# Patient Record
Sex: Male | Born: 1955
Health system: Southern US, Community
[De-identification: ages and names within clinical notes are randomized; demographics above are authoritative.]

## PROBLEM LIST (undated history)

## (undated) DIAGNOSIS — IMO0001 Reserved for inherently not codable concepts without codable children: Secondary | ICD-10-CM

## (undated) DIAGNOSIS — J449 Chronic obstructive pulmonary disease, unspecified: Secondary | ICD-10-CM

## (undated) DIAGNOSIS — I519 Heart disease, unspecified: Secondary | ICD-10-CM

## (undated) DIAGNOSIS — F1910 Other psychoactive substance abuse, uncomplicated: Secondary | ICD-10-CM

## (undated) DIAGNOSIS — N179 Acute kidney failure, unspecified: Secondary | ICD-10-CM

## (undated) DIAGNOSIS — I509 Heart failure, unspecified: Secondary | ICD-10-CM

## (undated) DIAGNOSIS — I4891 Unspecified atrial fibrillation: Secondary | ICD-10-CM

## (undated) DIAGNOSIS — E785 Hyperlipidemia, unspecified: Secondary | ICD-10-CM

## (undated) DIAGNOSIS — F419 Anxiety disorder, unspecified: Secondary | ICD-10-CM

## (undated) HISTORY — DX: Heart disease, unspecified: I51.9

## (undated) HISTORY — DX: Hyperlipidemia, unspecified: E78.5

## (undated) HISTORY — DX: Unspecified atrial fibrillation: I48.91

---

## 2015-05-07 DIAGNOSIS — Z85828 Personal history of other malignant neoplasm of skin: Secondary | ICD-10-CM | POA: Insufficient documentation

## 2016-03-31 ENCOUNTER — Inpatient Hospital Stay (HOSPITAL_COMMUNITY)
Admit: 2016-03-31 | Discharge: 2016-03-31 | Disposition: A | Discharge status: Home / Self Care | Payer: 59 | Attending: Internal Medicine | Admitting: Internal Medicine

## 2016-03-31 ENCOUNTER — Emergency Department: Payer: 59

## 2016-03-31 ENCOUNTER — Inpatient Hospital Stay
Admission: EM | Admit: 2016-03-31 | Discharge: 2016-04-02 | DRG: 291 | Payer: 59 | Attending: Internal Medicine | Admitting: Internal Medicine

## 2016-03-31 ENCOUNTER — Inpatient Hospital Stay: Payer: 59

## 2016-03-31 ENCOUNTER — Encounter: Payer: Self-pay | Admitting: Emergency Medicine

## 2016-03-31 DIAGNOSIS — I5031 Acute diastolic (congestive) heart failure: Secondary | ICD-10-CM | POA: Diagnosis not present

## 2016-03-31 DIAGNOSIS — I5021 Acute systolic (congestive) heart failure: Secondary | ICD-10-CM | POA: Diagnosis not present

## 2016-03-31 DIAGNOSIS — N170 Acute kidney failure with tubular necrosis: Secondary | ICD-10-CM | POA: Diagnosis present

## 2016-03-31 DIAGNOSIS — Z452 Encounter for adjustment and management of vascular access device: Secondary | ICD-10-CM

## 2016-03-31 DIAGNOSIS — I509 Heart failure, unspecified: Secondary | ICD-10-CM | POA: Diagnosis not present

## 2016-03-31 DIAGNOSIS — I4892 Unspecified atrial flutter: Secondary | ICD-10-CM | POA: Diagnosis present

## 2016-03-31 DIAGNOSIS — I5023 Acute on chronic systolic (congestive) heart failure: Secondary | ICD-10-CM | POA: Diagnosis present

## 2016-03-31 DIAGNOSIS — I130 Hypertensive heart and chronic kidney disease with heart failure and stage 1 through stage 4 chronic kidney disease, or unspecified chronic kidney disease: Secondary | ICD-10-CM | POA: Diagnosis present

## 2016-03-31 DIAGNOSIS — R000 Tachycardia, unspecified: Secondary | ICD-10-CM | POA: Diagnosis present

## 2016-03-31 DIAGNOSIS — I4891 Unspecified atrial fibrillation: Secondary | ICD-10-CM | POA: Diagnosis present

## 2016-03-31 DIAGNOSIS — I248 Other forms of acute ischemic heart disease: Secondary | ICD-10-CM | POA: Diagnosis present

## 2016-03-31 DIAGNOSIS — I5041 Acute combined systolic (congestive) and diastolic (congestive) heart failure: Secondary | ICD-10-CM | POA: Diagnosis not present

## 2016-03-31 DIAGNOSIS — J449 Chronic obstructive pulmonary disease, unspecified: Secondary | ICD-10-CM | POA: Diagnosis present

## 2016-03-31 DIAGNOSIS — F1010 Alcohol abuse, uncomplicated: Secondary | ICD-10-CM | POA: Diagnosis present

## 2016-03-31 DIAGNOSIS — I959 Hypotension, unspecified: Secondary | ICD-10-CM | POA: Diagnosis present

## 2016-03-31 DIAGNOSIS — R7989 Other specified abnormal findings of blood chemistry: Secondary | ICD-10-CM | POA: Diagnosis present

## 2016-03-31 DIAGNOSIS — J810 Acute pulmonary edema: Secondary | ICD-10-CM

## 2016-03-31 DIAGNOSIS — Z7982 Long term (current) use of aspirin: Secondary | ICD-10-CM

## 2016-03-31 DIAGNOSIS — I420 Dilated cardiomyopathy: Secondary | ICD-10-CM | POA: Diagnosis present

## 2016-03-31 DIAGNOSIS — Z87891 Personal history of nicotine dependence: Secondary | ICD-10-CM | POA: Diagnosis not present

## 2016-03-31 DIAGNOSIS — R778 Other specified abnormalities of plasma proteins: Secondary | ICD-10-CM

## 2016-03-31 DIAGNOSIS — R0602 Shortness of breath: Secondary | ICD-10-CM | POA: Diagnosis present

## 2016-03-31 DIAGNOSIS — Z8249 Family history of ischemic heart disease and other diseases of the circulatory system: Secondary | ICD-10-CM

## 2016-03-31 DIAGNOSIS — N179 Acute kidney failure, unspecified: Secondary | ICD-10-CM | POA: Diagnosis not present

## 2016-03-31 DIAGNOSIS — K761 Chronic passive congestion of liver: Secondary | ICD-10-CM | POA: Diagnosis present

## 2016-03-31 DIAGNOSIS — K7200 Acute and subacute hepatic failure without coma: Secondary | ICD-10-CM | POA: Diagnosis present

## 2016-03-31 DIAGNOSIS — N189 Chronic kidney disease, unspecified: Secondary | ICD-10-CM | POA: Diagnosis present

## 2016-03-31 DIAGNOSIS — F1290 Cannabis use, unspecified, uncomplicated: Secondary | ICD-10-CM | POA: Diagnosis present

## 2016-03-31 DIAGNOSIS — Z79899 Other long term (current) drug therapy: Secondary | ICD-10-CM

## 2016-03-31 DIAGNOSIS — R570 Cardiogenic shock: Secondary | ICD-10-CM | POA: Diagnosis present

## 2016-03-31 DIAGNOSIS — R945 Abnormal results of liver function studies: Secondary | ICD-10-CM

## 2016-03-31 HISTORY — DX: Heart failure, unspecified: I50.9

## 2016-03-31 HISTORY — DX: Acute kidney failure, unspecified: N17.9

## 2016-03-31 HISTORY — DX: Chronic obstructive pulmonary disease, unspecified: J44.9

## 2016-03-31 HISTORY — DX: Other psychoactive substance abuse, uncomplicated: F19.10

## 2016-03-31 HISTORY — DX: Reserved for inherently not codable concepts without codable children: IMO0001

## 2016-03-31 HISTORY — DX: Anxiety disorder, unspecified: F41.9

## 2016-03-31 LAB — COMPREHENSIVE METABOLIC PANEL
ALT: 828 U/L — ABNORMAL HIGH (ref 17–63)
AST: 785 U/L — ABNORMAL HIGH (ref 15–41)
Albumin: 4.1 g/dL (ref 3.5–5.0)
Alkaline Phosphatase: 81 U/L (ref 38–126)
Anion gap: 10 (ref 5–15)
BUN: 19 mg/dL (ref 6–20)
CO2: 23 mmol/L (ref 22–32)
Calcium: 8.9 mg/dL (ref 8.9–10.3)
Chloride: 99 mmol/L — ABNORMAL LOW (ref 101–111)
Creatinine, Ser: 1.12 mg/dL (ref 0.61–1.24)
GFR calc Af Amer: >60 mL/min (ref >60)
GFR calc non Af Amer: >60 mL/min (ref >60)
Glucose, Bld: 146 mg/dL — ABNORMAL HIGH (ref 65–99)
Potassium: 3.5 mmol/L (ref 3.5–5.1)
Sodium: 132 mmol/L — ABNORMAL LOW (ref 135–145)
Total Bilirubin: 2.9 mg/dL — ABNORMAL HIGH (ref 0.3–1.2)
Total Protein: 7.4 g/dL (ref 6.5–8.1)

## 2016-03-31 LAB — MAGNESIUM
Magnesium: <0.1 mg/dL — CL (ref 1.7–2.4)
Magnesium: 2.9 mg/dL — ABNORMAL HIGH (ref 1.7–2.4)

## 2016-03-31 LAB — CBC
HCT: 40.4 % (ref 40.0–52.0)
Hemoglobin: 13.4 g/dL (ref 13.0–18.0)
MCH: 32.6 pg (ref 26.0–34.0)
MCHC: 33.2 g/dL (ref 32.0–36.0)
MCV: 98 fL (ref 80.0–100.0)
Platelets: 276 10*3/uL (ref 150–440)
RBC: 4.12 MIL/uL — ABNORMAL LOW (ref 4.40–5.90)
RDW: 14.3 % (ref 11.5–14.5)
WBC: 11.4 10*3/uL — ABNORMAL HIGH (ref 3.8–10.6)

## 2016-03-31 LAB — GLUCOSE, CAPILLARY: Glucose-Capillary: 152 mg/dL — ABNORMAL HIGH (ref 65–99)

## 2016-03-31 LAB — MRSA PCR SCREENING: MRSA by PCR: NEGATIVE

## 2016-03-31 LAB — BRAIN NATRIURETIC PEPTIDE: B Natriuretic Peptide: 1693 pg/mL — ABNORMAL HIGH (ref 0.0–100.0)

## 2016-03-31 LAB — IRON AND TIBC
Iron: 79 ug/dL (ref 45–182)
Saturation Ratios: 23 % (ref 17.9–39.5)
TIBC: 349 ug/dL (ref 250–450)
UIBC: 270 ug/dL

## 2016-03-31 LAB — FERRITIN: Ferritin: 3690 ng/mL — ABNORMAL HIGH (ref 24–336)

## 2016-03-31 LAB — ACETAMINOPHEN LEVEL: Acetaminophen (Tylenol), Serum: <10 ug/mL — ABNORMAL LOW (ref 10–30)

## 2016-03-31 LAB — ECHOCARDIOGRAM COMPLETE
Height: 71 in
Weight: 2694.9 oz

## 2016-03-31 LAB — PROTIME-INR
INR: 1.84
Prothrombin Time: 21.2 seconds — ABNORMAL HIGH (ref 11.4–15.0)

## 2016-03-31 LAB — TROPONIN I
Troponin I: 0.17 ng/mL — ABNORMAL HIGH (ref <0.031)
Troponin I: 0.2 ng/mL — ABNORMAL HIGH (ref <0.031)
Troponin I: 0.19 ng/mL — ABNORMAL HIGH (ref <0.031)

## 2016-03-31 LAB — TSH
TSH: 0.436 u[IU]/mL (ref 0.350–4.500)
TSH: 1.135 u[IU]/mL (ref 0.350–4.500)

## 2016-03-31 LAB — FIBRIN DERIVATIVES D-DIMER (ARMC ONLY): Fibrin derivatives D-dimer (ARMC): 3388 — ABNORMAL HIGH (ref 0–499)

## 2016-03-31 MED ORDER — IOPAMIDOL (ISOVUE-370) INJECTION 76%
100.0000 mL | Freq: Once | INTRAVENOUS | Status: AC | PRN
  Administered 2016-03-31: 100 mL via INTRAVENOUS

## 2016-03-31 MED ORDER — AMIODARONE LOAD VIA INFUSION
150.0000 mg | Freq: Once | INTRAVENOUS | Status: AC
Start: 2016-03-31 — End: 2016-03-31
  Administered 2016-03-31: 150 mg via INTRAVENOUS
  Filled 2016-03-31: qty 83.34

## 2016-03-31 MED ORDER — FUROSEMIDE 10 MG/ML IJ SOLN
40.0000 mg | Freq: Two times a day (BID) | INTRAMUSCULAR | Status: DC
  Administered 2016-03-31 – 2016-04-02 (×4): 40 mg via INTRAVENOUS
  Filled 2016-03-31 (×4): qty 4

## 2016-03-31 MED ORDER — AMIODARONE HCL IN DEXTROSE 360-4.14 MG/200ML-% IV SOLN: 60.0000 mg/h | INTRAVENOUS | Status: DC

## 2016-03-31 MED ORDER — ENOXAPARIN SODIUM 40 MG/0.4ML ~~LOC~~ SOLN: 40.0000 mg | SUBCUTANEOUS | Status: DC

## 2016-03-31 MED ORDER — AMIODARONE HCL IN DEXTROSE 360-4.14 MG/200ML-% IV SOLN
60.0000 mg/h | INTRAVENOUS | Status: AC
  Administered 2016-03-31 (×2): 60 mg/h via INTRAVENOUS
  Filled 2016-03-31 (×2): qty 200

## 2016-03-31 MED ORDER — DILTIAZEM HCL 25 MG/5ML IV SOLN
15.0000 mg | Freq: Once | INTRAVENOUS | Status: AC
  Administered 2016-03-31: 15 mg via INTRAVENOUS
  Filled 2016-03-31: qty 5

## 2016-03-31 MED ORDER — MAGNESIUM SULFATE 2 GM/50ML IV SOLN
2.0000 g | Freq: Once | INTRAVENOUS | Status: AC
  Administered 2016-03-31: 2 g via INTRAVENOUS
  Filled 2016-03-31: qty 50

## 2016-03-31 MED ORDER — TIOTROPIUM BROMIDE MONOHYDRATE 18 MCG IN CAPS
18.0000 ug | ORAL_CAPSULE | Freq: Every day | RESPIRATORY_TRACT | Status: DC
  Administered 2016-03-31 – 2016-04-02 (×3): 18 ug via RESPIRATORY_TRACT
  Filled 2016-03-31: qty 5

## 2016-03-31 MED ORDER — AMIODARONE LOAD VIA INFUSION
150.0000 mg | Freq: Once | INTRAVENOUS | Status: DC
  Filled 2016-03-31: qty 83.34

## 2016-03-31 MED ORDER — DILTIAZEM HCL 30 MG PO TABS
30.0000 mg | ORAL_TABLET | Freq: Four times a day (QID) | ORAL | Status: DC
  Administered 2016-03-31 (×2): 30 mg via ORAL
  Filled 2016-03-31 (×2): qty 1

## 2016-03-31 MED ORDER — IPRATROPIUM-ALBUTEROL 0.5-2.5 (3) MG/3ML IN SOLN: 3.0000 mL | Freq: Four times a day (QID) | RESPIRATORY_TRACT | Status: DC | PRN

## 2016-03-31 MED ORDER — DEXTROSE 5 % IV SOLN: 60.0000 mg/h | Freq: Once | INTRAVENOUS | Status: DC

## 2016-03-31 MED ORDER — ASPIRIN 81 MG PO CHEW
81.0000 mg | CHEWABLE_TABLET | Freq: Every day | ORAL | Status: DC
  Administered 2016-03-31 – 2016-04-02 (×3): 81 mg via ORAL
  Filled 2016-03-31 (×3): qty 1

## 2016-03-31 MED ORDER — CETYLPYRIDINIUM CHLORIDE 0.05 % MT LIQD
7.0000 mL | Freq: Two times a day (BID) | OROMUCOSAL | Status: DC
  Administered 2016-03-31 – 2016-04-01 (×3): 7 mL via OROMUCOSAL

## 2016-03-31 MED ORDER — LISINOPRIL 5 MG PO TABS
5.0000 mg | ORAL_TABLET | Freq: Every day | ORAL | Status: DC
  Administered 2016-03-31: 5 mg via ORAL
  Filled 2016-03-31: qty 1

## 2016-03-31 MED ORDER — AMIODARONE HCL IN DEXTROSE 360-4.14 MG/200ML-% IV SOLN: 30.0000 mg/h | INTRAVENOUS | Status: DC

## 2016-03-31 MED ORDER — FUROSEMIDE 10 MG/ML IJ SOLN: 20.0000 mg | Freq: Two times a day (BID) | INTRAMUSCULAR | Status: DC

## 2016-03-31 MED ORDER — HEPARIN SODIUM (PORCINE) 5000 UNIT/ML IJ SOLN
5000.0000 [IU] | Freq: Three times a day (TID) | INTRAMUSCULAR | Status: DC
  Administered 2016-03-31: 5000 [IU] via SUBCUTANEOUS
  Filled 2016-03-31: qty 1

## 2016-03-31 MED ORDER — FUROSEMIDE 10 MG/ML IJ SOLN
40.0000 mg | Freq: Once | INTRAMUSCULAR | Status: AC
  Administered 2016-03-31: 40 mg via INTRAVENOUS
  Filled 2016-03-31: qty 4

## 2016-03-31 MED ORDER — AMIODARONE HCL IN DEXTROSE 360-4.14 MG/200ML-% IV SOLN
30.0000 mg/h | INTRAVENOUS | Status: DC
  Administered 2016-03-31 – 2016-04-01 (×3): 30 mg/h via INTRAVENOUS
  Filled 2016-03-31 (×5): qty 200

## 2016-03-31 NOTE — Progress Notes (Signed)
Pharmacy Consult for Electrolyte Monitoring   No Known Allergies  Patient Measurements: Height: 5\' 11"  (180.3 cm) Weight: 168 lb 6.9 oz (76.4 kg) IBW/kg (Calculated) : 75.3  Vital Signs: Temp: 97.8 F (36.6 C) (04/03 1252) Temp Source: Oral (04/03 1252) BP: 106/86 mmHg (04/03 1252) Pulse Rate: 132 (04/03 1211) Intake/Output from previous day:   Intake/Output from this shift: Total I/O In: -  Out: 200 [Urine:200]  Labs:  Recent Labs  03/31/16 0658 03/31/16 1245  WBC 11.4*  --   HGB 13.4  --   HCT 40.4  --   PLT 276  --   INR  --  1.84     Recent Labs  03/31/16 0658  NA 132*  K 3.5  CL 99*  CO2 23  GLUCOSE 146*  BUN 19  CREATININE 1.12  CALCIUM 8.9  MG <0.1*  PROT 7.4  ALBUMIN 4.1  AST 785*  ALT 828*  ALKPHOS 81  BILITOT 2.9*   Estimated Creatinine Clearance: 75.6 mL/min (by C-G formula based on Cr of 1.12).    Recent Labs  03/31/16 1255  GLUCAP 152*    Medical History: Past Medical History  Diagnosis Date  . COPD (chronic obstructive pulmonary disease) (HCC)   . CHF (congestive heart failure) (HCC)   . Polysubstance abuse     Medications:  Scheduled:  . aspirin  81 mg Oral Daily  . diltiazem  30 mg Oral 4 times per day  . enoxaparin (LOVENOX) injection  40 mg Subcutaneous Q24H  . furosemide  20 mg Intravenous Q12H  . tiotropium  18 mcg Inhalation Daily   Infusions:  . amiodarone      Assessment: Pharmacy consulted to assist in managing electrolytes in this 60 y/o M with afib.   Plan:  Electrolytes are wnl except for magnesium is < 0.1. Will repeat magnesium level and replace if necessary.   Luisa Hart D 03/31/2016,2:34 PM

## 2016-03-31 NOTE — Consult Note (Signed)
GI Inpatient Consult Note  Reason for Consult: Elevated LFTs   Attending Requesting Consult: Vachhani  History of Present Illness: Caleb Barnes is a 60 y.o. male with a history of CHF, COPD, and polysubstance abuse admitted with new Afib w/ RVR now on Amiodarone.  Patient states he presented to the [facility] ED this morning for SOB, PND, and palpitations.  These symptoms have occurred intermittently over the last 2-3 years, worse at night. Over the last month or so, episodes have become more frequent.    VSS. Afebrile. EKG: demonstrated A flutter w/ RVR. Labs: AST 785, ALT 828, T bili 2.9; INR 1.8. Troponin 0.17, BNP 1693, Mg++ <0.1, Na 132, K+ 3.5, d-dimer elevated at 3388, WBC 11.4. Acute hepatitis panel pending. CXR: CHF with pulmonary interstitial edema and small left pleural effusion CTA chest: marked cardiomegaly with vascular congestion and probable perihilar interstitial edema; small bilateral effuses (R>L) RUQ [place]: gallbladder thickening w/o cholelithiasis or biliary dilatation, trace ascites, unremarkable liver  Patient was admitted for further evaluation and management.  HR unimproved after Cardizem bolus, now on Amiodarone w/ EKG improved.  GI consult was requested for evaluation of elevated LFTs.  Today, Caleb Barnes states he is feeling much better since admission.  He denies a personal or family history of liver disease.  Patient notes increased LE edema over the last several weeks.  Patient's wife, present at bedside, states she has noticed some increase abdominal swelling as well.  He endorses drinking approximately 48 oz of beer daily or QOD.  He has smoked 1.5 packs of cigarettes for 45 years.  He also smokes mariajuana daily, but denies other IV or other illicit drug use.  He has never received a blood transfusion.  No other liver-related symptoms such as jaundice, itching, confusion, rectal bleeding, and dark stools.    Past Medical History:  Past Medical History  Diagnosis Date   . COPD (chronic obstructive pulmonary disease) (HCC)   . CHF (congestive heart failure) (HCC)   . Polysubstance abuse     Problem List: Patient Active Problem List   Diagnosis Date Noted  . Atrial fibrillation with rapid ventricular response (HCC) 03/31/2016    Past Surgical History: History reviewed. No pertinent past surgical history.  Allergies: No Known Allergies  Home Medications: Prescriptions prior to admission  Medication Sig Dispense Refill Last Dose  . albuterol (PROVENTIL HFA;VENTOLIN HFA) 108 (90 Base) MCG/ACT inhaler Inhale 1 puff into the lungs every 4 (four) hours as needed for wheezing or shortness of breath.   prn at prn  . aspirin 81 MG chewable tablet Chew 81 mg by mouth daily.   03/29/2016 at am   . doxycycline (VIBRA-TABS) 100 MG tablet Take 100 mg by mouth 2 (two) times daily. For 10 days   03/30/2016 at Unknown time  . tiotropium (SPIRIVA) 18 MCG inhalation capsule Place 18 mcg into inhaler and inhale daily.   03/30/2016 at Unknown time   Home medication reconciliation was completed with the patient.   Scheduled Inpatient Medications:   . aspirin  81 mg Oral Daily  . diltiazem  30 mg Oral 4 times per day  . furosemide  20 mg Intravenous Q12H  . heparin  5,000 Units Subcutaneous 3 times per day  . tiotropium  18 mcg Inhalation Daily    Continuous Inpatient Infusions:   . amiodarone 60 mg/hr (03/31/16 1146)   Followed by  . amiodarone      PRN Inpatient Medications:  ipratropium-albuterol  Family History: family history  includes CAD in his father.    Social History:   reports that he has quit smoking. He has never used smokeless tobacco. He reports that he does not drink alcohol or use illicit drugs.   Review of Systems: Constitutional: Weight is stable. + Fatigue, Weakness Eyes: No changes in vision. ENT: No oral lesions, sore throat.  GI: see HPI.  Heme/Lymph: No easy bruising.  CV: + Palpitations GU: No hematuria.  Integumentary: No rashes.   Neuro: No headaches.  Psych: No depression/anxiety.  Endocrine: No heat/cold intolerance.  Allergic/Immunologic: No urticaria.  Resp: No cough; + SOB.  Musculoskeletal: No joint swelling.    Physical Examination: BP 106/86 mmHg  Pulse 132  Temp(Src) 97.8 F (36.6 C) (Oral)  Resp 25  Ht  (1.803 m)  Wt 76.4 kg (168 lb 6.9 oz)  BMI 23.50 kg/m2  SpO2 100% Gen: NAD, alert and oriented x 4 HEENT: PEERLA, EOMI, Neck: supple, no JVD or thyromegaly Chest: CTA bilaterally, no wheezes, crackles, or other adventitious sounds CV: RRR, no m/g/c/r Abd: soft, NT, ND, no fluid wave noted, +BS in all four quadrants; no HSM, guarding, ridigity, or rebound tenderness Ext: no edema, well perfused with 2+ pulses Skin: no rash or lesions noted, + jaundice Lymph: no LAD  Data: Lab Results  Component Value Date   WBC 11.4* 03/31/2016   HGB 13.4 03/31/2016   HCT 40.4 03/31/2016   MCV 98.0 03/31/2016   PLT 276 03/31/2016    Recent Labs Lab 03/31/16 0658  HGB 13.4   Lab Results  Component Value Date   NA 132* 03/31/2016   K 3.5 03/31/2016   CL 99* 03/31/2016   CO2 23 03/31/2016   BUN 19 03/31/2016   CREATININE 1.12 03/31/2016   Lab Results  Component Value Date   ALT 828* 03/31/2016   AST 785* 03/31/2016   ALKPHOS 81 03/31/2016   BILITOT 2.9* 03/31/2016   No results for input(s): APTT, INR, PTT in the last 168 hours.   Assessment/Plan: Caleb Barnes is a 60 y.o. male with a history of CHF, COPD, and polypsubstance abuse admitted with new Afib w/ RVR now on Amiodarone.  LFTs also elevated with AST 785, ALT 828, T bili 2.9.  INR 1.8.  Acute hep panel pending.  RUQ US demonstrated an unremarkable liver, but gallbladder thickening w/o cholelithiasis or biliary dilation.  Patient endorses significant EtOH use daily or QOD. He denies previous liver-related issues.  Elevated LFTs likely due to congestive hepatopathy rather than ischemic hepatopathy, though the later is a consideration  if he became hypotensive while in Afib.  Patient's INR of 1.8 suggests there is liver injury or possible cirrhosis.  Recommend liver US w/ doppler and additional serology to exclude other causes of liver disease.  Also recommend trending INR and LFTs daily; LFTs should trend down with diuresis.  Also recommend watching for AMS and avoid sedating meds.  Will continue to follow.  Further recs per Dr. Shelle Iron.  Recommendations: - Liver US w/ dopplers - Additional hepatic serology today - Trend LFTs and INR daily - Monitor AMS, avoid sedating meds - Counseled patient regarding the importance of EtOH abstinence  Thank you for the consult. We will follow along with you. Please call with questions or concerns.  Burman Freestone, PA-C [facility] Phone: [phone] Pager: [phone]

## 2016-03-31 NOTE — ED Provider Notes (Signed)
[facility] Emergency Department Provider Note  ____________________________________________    I have reviewed the triage vital signs and the nursing notes.   HISTORY  Chief Complaint Shortness of Breath    HPI Caleb Barnes is a 60 y.o. male who presents with complaints of shortness of breath. Patient reports this is been occurring intermittently but primarily when he lies down at night. 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 But typically during the night he develops shortness of breath and occasionally feels heart palpitations. He does not see a physician regularly but did see someone who prescribed him steroids which did not seem to improve the situation. He denies recent travel. No calf pain. No History of DVTs. No lower show any swelling. No chest pain. No fevers chills or cough.     Past Medical History  Diagnosis Date  . COPD (chronic obstructive pulmonary disease) (HCC)   . CHF (congestive heart failure) (HCC)     There are no active problems to display for this patient.   History reviewed. No pertinent past surgical history.  No current outpatient prescriptions on file.  Allergies Review of patient's allergies indicates no known allergies.  No family history on file.  Social History Social History  Substance Use Topics  . Smoking status: Former Games developer  . Smokeless tobacco: Never Used  . Alcohol Use: No    Review of Systems  Constitutional: Negative for fever.No dizziness Eyes: Negative for redness ENT: Negative for sore throat Cardiovascular: Negative for chest pain Respiratory: As above Gastrointestinal: No abdominal pain Genitourinary: Negative for dysuria. Musculoskeletal: Negative for back pain. Skin: Negative for rash. Neurological: Negative for focal weakness or tingling Psychiatric: no  anxiety    ____________________________________________   PHYSICAL EXAM:  VITAL SIGNS: ED Triage Vitals  Enc Vitals Group     BP 03/31/16 0649 116/88 mmHg     Pulse Rate 03/31/16 0649 73     Resp 03/31/16 0649 16     Temp 03/31/16 0649 97.6 F (36.4 C)     Temp Source 03/31/16 0649 Oral     SpO2 03/31/16 0649 100 %     Weight 03/31/16 0649 170 lb (77.111 kg)     Height 03/31/16 0649 5\' 11"  (1.803 m)     Head Cir --      Peak Flow --      Pain Score --      Pain Loc --      Pain Edu? --      Excl. in GC? --      Constitutional: Alert and oriented. Well appearing and in no distress.  Eyes: Conjunctivae are normal. No erythema or injection ENT   Head: Normocephalic and atraumatic.   Mouth/Throat: Mucous membranes are moist. Cardiovascular: Tachycardia, regular rhythm. Normal and symmetric distal pulses are present in the upper extremities.  Respiratory: Normal respiratory effort without tachypnea nor retractions. Breath sounds are clear and equal bilaterally. No wheezing Gastrointestinal: Soft and non-tender in all quadrants. No distention. There is no CVA tenderness. Genitourinary: deferred Musculoskeletal: Nontender with normal range of motion in all extremities. No lower extremity tenderness nor edema. Neurologic:  Normal speech and language. No gross focal neurologic deficits are appreciated. Skin:  Skin is warm, dry and intact. No rash noted. Psychiatric: Mood and affect are normal. Patient exhibits appropriate insight and judgment.  ____________________________________________    LABS (pertinent positives/negatives)  Labs Reviewed  CBC -  Abnormal; Notable for the following:    WBC 11.4 (*)    RBC 4.12 (*)    All other components within normal limits  TROPONIN I  COMPREHENSIVE METABOLIC PANEL  MAGNESIUM  TSH  BRAIN NATRIURETIC PEPTIDE  FIBRIN DERIVATIVES D-DIMER ([facility] ONLY)    ____________________________________________   EKG  ED ECG  REPORT I, Jene Every, the attending physician, personally viewed and interpreted this ECG.   Date: 03/31/2016  EKG Time: 6:52 AM  Rate: 144  Rhythm: Wide QRS tachycardia  Axis: Normal  Intervals:nonspecific intraventricular conduction delay  ST&T Change: Nonspecific   ____________________________________________    RADIOLOGY  Chest x-ray consistent with pulmonary edema  ____________________________________________   PROCEDURES  Procedure(s) performed: none  Critical Care performed: yes  CRITICAL CARE Performed by: Jene Every   Total critical care time: 35 minutes  Critical care time was exclusive of separately billable procedures and treating other patients.  Critical care was necessary to treat or prevent imminent or life-threatening deterioration.  Critical care was time spent personally by me on the following activities: development of treatment plan with patient and/or surrogate as well as nursing, discussions with consultants, evaluation of patient's response to treatment, examination of patient, obtaining history from patient or surrogate, ordering and performing treatments and interventions, ordering and review of laboratory studies, ordering and review of radiographic studies, pulse oximetry and re-evaluation of patient's condition.   ____________________________________________   INITIAL IMPRESSION / ASSESSMENT AND PLAN / ED COURSE  Pertinent labs & imaging results that were available during my care of the patient were reviewed by me and considered in my medical decision making (see chart for details).  ----------------------------------------- 7:49 AM on 03/31/2016 -----------------------------------------  Discussed with Dr. Kirke Corin of cardiology who recommends Cardizem bolus.  ----------------------------------------- 8:10 AM on 03/31/2016 -----------------------------------------  No change in rate with Cardizem. I ordered amiodarone bolus  and infusion as I suspect atrial flutter, patient continues with stable blood pressures but chest x-ray is consistent with pulmonary edema. LASIX 40 mg ordered.  Discussed elevate troponin and x-ray with Dr. Kirke Corin who agrees with current management, no heparin at this time  ____________________________________________   FINAL CLINICAL IMPRESSION(S) / ED DIAGNOSES  Final diagnoses:  Acute congestive heart failure, unspecified congestive heart failure type (HCC)  Atrial flutter, unspecified type (HCC)  Elevated troponin  Acute pulmonary edema (HCC)          Jene Every, MD 03/31/16 [phone]

## 2016-03-31 NOTE — Consult Note (Signed)
Cardiology Consultation Note  Patient ID: Caleb Barnes, MRN: [id number], DOB/AGE: 07/01/1956 60 y.o. Admit date: 03/31/2016   Date of Consult: 03/31/2016 Primary Physician: [facility] Primary Cardiologist: New to [facility]  Chief Complaint: SOB Reason for Consult: New onset atrial flutter with RVR and elevated troponin   HPI: 60 y.o. male with h/o tobacco abuse up until 2014 when he quit in the setting of PND who presented to [facility] ED on 4/3 with SOB. He was found ot be in atrial flutter with RVR with 2:1 conduction.   He has no previously known cardiac history. For the past 2-3 years he has been experiencing intermittent PND and palpitations. Initially these symptoms were quite rare, though as of late they have been more frequent occuring a couple of times weekly and lasting until he is seen by a medical provider. He saw his PCP approximately one month prior for his SOB and was prescribed azithromycin, prednisone, and PO Lasix. He was told he had COPD and possibly CHF. There are no records of him ever being seen in the ED here, [facility], or in Care Everywhere for this. He reports no one has ever told him he has a fast heart rate. No prior echocardiograms, stress tests, or cardiac caths. His weight has been stable at 169 to 170 pounds. He denies any LE edema, orthopnea, or early satiety. No chest pain. He has previously smoked tobacco for 45 years at 1.5 packs daily. He continues to drink ETOH at 1 twenty-four once and 2 twelve once beers daily. He smokes marijuana daily, but denies ever using any other illegal drugs.   Upon the patient's arrival to [facility] they were found to have troponin 0.17, BNP 1693, AST 785, ALT 828, T bili 2.9, Mg++ <0.1, Na 132, K+ 3.5, d-dimer elevated at 3388, WBC 11.4. ECG showed atrial flutter with RVR with HR in the 140's, nonspecific QRS widening, prolonged QTc of 545 msec, inferolateral TWI, CXR showed CHF with pulmonary interstitial edema and small left pleural  effusion. CTA chest PE protocol is pending at this time. He initially received Cardizem bolus without aid in his heart rate. He was subsequently placed on amiodarone gtt with conversion to NSR with. Follow up EKG showed NSR, 99 bpm, RSR`, IVCD, inferior Q waves.    Past Medical History  Diagnosis Date  . COPD (chronic obstructive pulmonary disease) (HCC)   . CHF (congestive heart failure) (HCC)   . Polysubstance abuse       Most Recent Cardiac Studies: None   Surgical History: History reviewed. No pertinent past surgical history.   Home Meds: Prior to Admission medications   Medication Sig Start Date End Date Taking? Authorizing Provider  albuterol (PROVENTIL HFA;VENTOLIN HFA) 108 (90 Base) MCG/ACT inhaler Inhale 1 puff into the lungs every 4 (four) hours as needed for wheezing or shortness of breath.   Yes Historical Provider, MD  aspirin 81 MG chewable tablet Chew 81 mg by mouth daily.   Yes Historical Provider, MD  doxycycline (VIBRA-TABS) 100 MG tablet Take 100 mg by mouth 2 (two) times daily. For 10 days 03/16/16  Yes Historical Provider, MD  tiotropium (SPIRIVA) 18 MCG inhalation capsule Place 18 mcg into inhaler and inhale daily.   Yes Historical Provider, MD    Inpatient Medications:  . diltiazem  30 mg Oral 4 times per day  . furosemide  20 mg Intravenous Q12H   . amiodarone 60 mg/hr (03/31/16 1146)   Followed by  . amiodarone  Allergies: No Known Allergies  Social History   Social History  . Marital Status: Divorced    Spouse Name: N/A  . Number of Children: N/A  . Years of Education: N/A   Occupational History  . Not on file.   Social History Main Topics  . Smoking status: Former Games developer  . Smokeless tobacco: Never Used  . Alcohol Use: No  . Drug Use: No  . Sexual Activity: Not on file   Other Topics Concern  . Not on file   Social History Narrative  . No narrative on file     Family History  Problem Relation Age of Onset  . CAD Father       Review of Systems: Review of Systems  Constitutional: Positive for malaise/fatigue. Negative for fever, chills, weight loss and diaphoresis.  HENT: Negative for congestion.   Eyes: Negative for discharge and redness.  Respiratory: Positive for shortness of breath. Negative for cough, hemoptysis, sputum production and wheezing.   Cardiovascular: Positive for palpitations and PND. Negative for chest pain, orthopnea, claudication and leg swelling.  Gastrointestinal: Negative for heartburn, nausea, vomiting, abdominal pain, blood in stool and melena.  Musculoskeletal: Negative for myalgias and falls.  Skin: Negative for rash.  Neurological: Positive for weakness. Negative for dizziness, tingling, tremors, sensory change, speech change, focal weakness and loss of consciousness.  Endo/Heme/Allergies: Does not bruise/bleed easily.  Psychiatric/Behavioral: Positive for substance abuse. The patient is not nervous/anxious.   All other systems reviewed and are negative.   Labs:  Recent Labs  03/31/16 0658  TROPONINI 0.17*   Lab Results  Component Value Date   WBC 11.4* 03/31/2016   HGB 13.4 03/31/2016   HCT 40.4 03/31/2016   MCV 98.0 03/31/2016   PLT 276 03/31/2016     Recent Labs Lab 03/31/16 0658  NA 132*  K 3.5  CL 99*  CO2 23  BUN 19  CREATININE 1.12  CALCIUM 8.9  PROT 7.4  BILITOT 2.9*  ALKPHOS 81  ALT 828*  AST 785*  GLUCOSE 146*   No results found for: CHOL, HDL, LDLCALC, TRIG No results found for: DDIMER  Radiology/Studies:  Ct Angio Chest Pe W/cm &/or Wo Cm  03/31/2016  CLINICAL DATA:  Difficulty sleeping, shortness of breath since Friday. EXAM: CT ANGIOGRAPHY CHEST WITH CONTRAST TECHNIQUE: Multidetector CT imaging of the chest was performed using the standard protocol during bolus administration of intravenous contrast. Multiplanar CT image reconstructions and MIPs were obtained to evaluate the vascular anatomy. CONTRAST:  100 cc Isovue 370 IV COMPARISON:   Chest x-ray performed today. FINDINGS: No filling defects in the pulmonary arteries to suggest pulmonary emboli. Small bilateral pleural effusions, right larger than left. Mild vascular congestion and central interstitial prominence could reflect early interstitial edema. Ground-glass opacities in the posterior lower lobes could reflect edema or atelectasis. Marked cardiomegaly. Aorta is normal caliber. Borderline size mediastinal and bilateral hilar lymph nodes may be related to congestion. Chest wall soft tissues are unremarkable. Imaging into the upper abdomen shows no acute findings. Review of the MIP images confirms the above findings. IMPRESSION: Marked cardiomegaly with vascular congestion and probable perihilar interstitial edema. Small bilateral effusions, right greater than left. No evidence of pulmonary embolus. Electronically Signed   By: Charlett Nose M.D.   On: 03/31/2016 10:34   Dg Chest Portable 1 View  03/31/2016  CLINICAL DATA:  Shortness of breath. EXAM: PORTABLE CHEST 1 VIEW COMPARISON:  No prior. FINDINGS: Cardiomegaly with pulmonary vascular prominence and bilateral interstitial prominence  with small left pleural effusion. Findings consistent with congestive heart failure. IMPRESSION: Congestive heart failure with pulmonary interstitial edema and small left pleural effusion. Electronically Signed   By: Maisie Fus  Register   On: 03/31/2016 08:08    EKG: triage EKG at 6:52 AM - atrial flutter with RVR with HR in the 144 bpm, nonspecific QRS widening, prolonged QTc of 545 msec, inferolateral TWI. Follow up EKG at 7:39 AM - NSR, 99 bpm, RSR`, IVCD, inferior Q waves  Weights: Filed Weights   03/31/16 0649  Weight: 170 lb (77.111 kg)     Physical Exam: Blood pressure 100/84, pulse 132, temperature 97.6 F (36.4 C), temperature source Oral, resp. rate 24, height 5\' 11"  (1.803 m), weight 170 lb (77.111 kg), SpO2 100 %. Body mass index is 23.72 kg/(m^2). General: Well developed, well  nourished, in no acute distress. Head: Normocephalic, atraumatic, sclera non-icteric, no xanthomas, nares are without discharge.  Neck: Negative for carotid bruits. JVD not elevated. Lungs: Clear bilaterally to auscultation without wheezes, rales, or rhonchi. Breathing is unlabored. Heart: Tachycardic, with S1 S2. No murmurs, rubs, or gallops appreciated. Abdomen: Soft, non-tender, non-distended with normoactive bowel sounds. No hepatomegaly. No rebound/guarding. No obvious abdominal masses. Msk:  Strength and tone appear normal for age. Extremities: No clubbing or cyanosis. No edema.  Distal pedal pulses are 2+ and equal bilaterally. Jaundice.  Neuro: Alert and oriented X 3. No facial asymmetry. No focal deficit. Moves all extremities spontaneously. Psych:  Responds to questions appropriately with a normal affect.    Assessment and Plan:   1. New onset atrial flutter with RVR: -No improvement with Cardizem bolus in the ED. Converted to NSR for approximately 20 minutes s/p amiodarone infusion, then went back into atrial flutter around 7:55 AM, with HR around 130 bpm -Continue amiodarone gtt at this time given soft BP as this precludes addition of BB -Would be cautious of CCB at this time given high likelihood of cardiomyopathy and decreased EF -CHADS2VASc at least 1 (CHF), thus he will not require long term, full-dose anticoagulation. However, he would benefit from this at this time should he require DCCV to convert to sinus rhythm  2. Acute heart failure: -Check echo to evaluate LV systolic function, BNP 1600 -Gentle IV diuresis given soft BP -Add BB when able -Limit IV fluids -Possibly tachy-mediated, though no prior ischemic work ups  3. Elevated troponin: -Initial troponin 0.17, continue to trend -At this time likely in the setting of #1 secondary to supply demand ischemia -If EF is low will require ischemic evaluation   4. Abnormal LFTs/possible congestive hepatopathy: -RUQ  ultrasound pending -Will require gentle diuresis given his soft BP  5. Hypomagnesemia: -Currently being repleted via IV -Possibly playing a role in #1 -At high risk of ventricular arrhythmia  -Pads in place      Signed, Eula Listen, PA-C Pager: [phone] 03/31/2016, 12:16 PM

## 2016-03-31 NOTE — ED Notes (Signed)
MD at bedside. 

## 2016-03-31 NOTE — ED Notes (Signed)
Pt taken to CT.

## 2016-03-31 NOTE — ED Notes (Signed)
Amiodarone infusion moved to left hand 20G to allow CT to use 18G to L upper arm for study. Pt to CT.

## 2016-03-31 NOTE — H&P (Signed)
[facility] [address]   PATIENT NAME: Caleb Barnes    MR#:  [id number]  DATE OF BIRTH:  July 17, 1956  DATE OF ADMISSION:  03/31/2016  PRIMARY CARE PHYSICIAN: [facility]   REQUESTING/REFERRING PHYSICIAN: [place]  CHIEF COMPLAINT:   Chief Complaint  Patient presents with  . Shortness of Breath    HISTORY OF PRESENT ILLNESS: Caleb Barnes  is a 60 y.o. male with a known history of Chronic smoking, stopped smoking 3 years ago. He had complain of feeling shortness of breath especially at nighttime when he is to stay in the recliner with some swelling on his ankles. He went to his primary care doctor last month and she prescribed prednisone and azithromycin with oral Lasix. She told him he has COPD and possibly congestive heart failure. The patient felt better after finishing that course for 1 or 2 weeks but then again for last 1 week started having the same complaint. He has to stay up in the night mostly in the recliner. He denies any chest pain but has some palpitation episodes on and off. Came to emergency room today and he was noted to have atrial fibrillation with rapid ventricular response, so started on amiodarone drip after discussing with cardiology and given to hospitalist team for further management.  PAST MEDICAL HISTORY:   Past Medical History  Diagnosis Date  . COPD (chronic obstructive pulmonary disease) (HCC)   . CHF (congestive heart failure) (HCC)     PAST SURGICAL HISTORY: History reviewed. No pertinent past surgical history.  SOCIAL HISTORY:  Social History  Substance Use Topics  . Smoking status: Former Games developer  . Smokeless tobacco: Never Used  . Alcohol Use: No    FAMILY HISTORY:  Family History  Problem Relation Age of Onset  . CAD Father     DRUG ALLERGIES: No Known Allergies  REVIEW OF SYSTEMS:   CONSTITUTIONAL: No fever, fatigue or weakness.  EYES: No blurred or double vision.  EARS, NOSE, AND  THROAT: No tinnitus or ear pain.  RESPIRATORY: No cough, positive for shortness of breath, wheezing or hemoptysis. Have orthopnea. CARDIOVASCULAR: No chest pain, orthopnea, edema.  GASTROINTESTINAL: No nausea, vomiting, diarrhea or abdominal pain.  GENITOURINARY: No dysuria, hematuria.  ENDOCRINE: No polyuria, nocturia,  HEMATOLOGY: No anemia, easy bruising or bleeding SKIN: No rash or lesion. MUSCULOSKELETAL: No joint pain or arthritis.   NEUROLOGIC: No tingling, numbness, weakness.  PSYCHIATRY: No anxiety or depression.   MEDICATIONS AT HOME:  Prior to Admission medications   Medication Sig Start Date End Date Taking? Authorizing Provider  albuterol (PROVENTIL HFA;VENTOLIN HFA) 108 (90 Base) MCG/ACT inhaler Inhale 1 puff into the lungs every 4 (four) hours as needed for wheezing or shortness of breath.   Yes Historical Provider, MD  aspirin 81 MG chewable tablet Chew 81 mg by mouth daily.   Yes Historical Provider, MD  doxycycline (VIBRA-TABS) 100 MG tablet Take 100 mg by mouth 2 (two) times daily. For 10 days 03/16/16  Yes Historical Provider, MD  tiotropium (SPIRIVA) 18 MCG inhalation capsule Place 18 mcg into inhaler and inhale daily.   Yes Historical Provider, MD      PHYSICAL EXAMINATION:   VITAL SIGNS: Blood pressure 100/87, pulse 135, temperature 97.6 F (36.4 C), temperature source Oral, resp. rate 29, height 5\' 11"  (1.803 m), weight 77.111 kg (170 lb), SpO2 97 %.  GENERAL:  60 y.o.-year-old patient lying in the bed with no acute distress.  EYES: Pupils equal,  round, reactive to light and accommodation. No scleral icterus. Extraocular muscles intact.  HEENT: Head atraumatic, normocephalic. Oropharynx and nasopharynx clear.  NECK:  Supple, no jugular venous distention. No thyroid enlargement, no tenderness.  LUNGS: Normal breath sounds bilaterally, no wheezing, Some crepitation. No use of accessory muscles of respiration.  CARDIOVASCULAR: S1, S2 regular, tachy, No murmurs,  rubs, or gallops.  ABDOMEN: Soft, nontender, nondistended. Bowel sounds present. No organomegaly or mass.  EXTREMITIES: No pedal edema, cyanosis, or clubbing.  NEUROLOGIC: Cranial nerves II through XII are intact. Muscle strength 5/5 in all extremities. Sensation intact. Gait not checked.  PSYCHIATRIC: The patient is alert and oriented x 3.  SKIN: No obvious rash, lesion, or ulcer.   LABORATORY PANEL:   CBC  Recent Labs Lab 03/31/16 0658  WBC 11.4*  HGB 13.4  HCT 40.4  PLT 276  MCV 98.0  MCH 32.6  MCHC 33.2  RDW 14.3   ------------------------------------------------------------------------------------------------------------------  Chemistries   Recent Labs Lab 03/31/16 0658  NA 132*  K 3.5  CL 99*  CO2 23  GLUCOSE 146*  BUN 19  CREATININE 1.12  CALCIUM 8.9  AST 785*  ALT 828*  ALKPHOS 81  BILITOT 2.9*   ------------------------------------------------------------------------------------------------------------------ estimated creatinine clearance is 75.6 mL/min (by C-G formula based on Cr of 1.12). ------------------------------------------------------------------------------------------------------------------ No results for input(s): TSH, T4TOTAL, T3FREE, THYROIDAB in the last 72 hours.  Invalid input(s): FREET3   Coagulation profile No results for input(s): INR, PROTIME in the last 168 hours. ------------------------------------------------------------------------------------------------------------------- No results for input(s): DDIMER in the last 72 hours. -------------------------------------------------------------------------------------------------------------------  Cardiac Enzymes  Recent Labs Lab 03/31/16 0658  TROPONINI 0.17*   ------------------------------------------------------------------------------------------------------------------ Invalid input(s):  POCBNP  ---------------------------------------------------------------------------------------------------------------  Urinalysis No results found for: COLORURINE, APPEARANCEUR, LABSPEC, PHURINE, GLUCOSEU, HGBUR, BILIRUBINUR, KETONESUR, PROTEINUR, UROBILINOGEN, NITRITE, LEUKOCYTESUR   RADIOLOGY: Dg Chest Portable 1 View  03/31/2016  CLINICAL DATA:  Shortness of breath. EXAM: PORTABLE CHEST 1 VIEW COMPARISON:  No prior. FINDINGS: Cardiomegaly with pulmonary vascular prominence and bilateral interstitial prominence with small left pleural effusion. Findings consistent with congestive heart failure. IMPRESSION: Congestive heart failure with pulmonary interstitial edema and small left pleural effusion. Electronically Signed   By: Maisie Fus  Register   On: 03/31/2016 08:08    EKG: A fib with RVR,   IMPRESSION AND PLAN:  * A fib with RVR   IV amio drip as per Dr. Jari Sportsman suggestion.   Start oral cardizem.   Troponin.   Echo.   Cardio to decide anticoagulation.   Mg is low- give IV replacement.   Check TSH.  * hypomagnesemia\   Replace IV.  * Ac CHF   Mostly diastolic   Check Echo   IV lasix.  * Elevated D dimer   Check CT angio for PE.  * COPD   Will give Duoneb.   No exacerbation at this time.  All the records are reviewed and case discussed with ED provider. Management plans discussed with the patient, family and they are in agreement.  CODE STATUS: Full. Code Status History    This patient does not have a recorded code status. Please follow your organizational policy for patients in this situation.       TOTAL TIME TAKING CARE OF THIS PATIENT: 50 critical care minutes.    Altamese Dilling M.D on 03/31/2016   Between 7am to 6pm - Pager - [phone]  After 6pm go to [email or web address] - password [email or web address] [facility]  [facility]  Office  [phone]  CC: Primary care physician; [facility]   Note: This dictation was prepared with  Dragon dictation along with smaller phrase technology. Any transcriptional errors that result from this process are unintentional.

## 2016-03-31 NOTE — ED Notes (Signed)
Pt states has had swollen ankles and has a history of CHF also.

## 2016-03-31 NOTE — ED Notes (Signed)
Report to Jane, RN  

## 2016-03-31 NOTE — Progress Notes (Signed)
*  PRELIMINARY RESULTS* Echocardiogram 2D Echocardiogram has been performed.  Caleb Barnes Hege 03/31/2016, 3:08 PM

## 2016-03-31 NOTE — ED Notes (Signed)
Gone to xray  

## 2016-03-31 NOTE — ED Notes (Signed)
Pt states three days of shob. Pt denies known fever, denies chills. Pt denies pain, denies dizziness, lightheadedness. Pt states "i just feel like i can't catch my breath." pt with history of COPD. Pt states has had dry cough, with 3 episodes of emesis in last 3 days. Pt appears in no acute distress.

## 2016-03-31 NOTE — ED Notes (Signed)
Dr Madelon Lips aware of magnesium critical result. No further orders at this time.

## 2016-03-31 NOTE — ED Notes (Addendum)
SOB and unable to sleep intermittently X 1 week. Pt stood in triage to have EKG done and HR jumped to 140's and has sustained. Pt alert and oriented X4, active, cooperative, pt in NAD. RR even and unlabored at this time, color WNL.    Denies CP, leg pain, abdominal pain. Pt states he has had decreased appetite in the last 3 days.

## 2016-03-31 NOTE — Progress Notes (Signed)
Dr. Kirke Corin and Dr. Elisabeth Pigeon notified of BMP and Troponon levels

## 2016-03-31 NOTE — Progress Notes (Signed)
Chaplain rounded the unit and provided a compassionate presence and spiritual support through silent prayer. Chaplain Darivs Lunden [phone] 

## 2016-03-31 NOTE — ED Notes (Signed)
Pt Hr remains unchanged, MD notified.

## 2016-04-01 ENCOUNTER — Encounter: Payer: Self-pay | Admitting: Cardiovascular Disease

## 2016-04-01 ENCOUNTER — Inpatient Hospital Stay: Payer: 59

## 2016-04-01 DIAGNOSIS — R0602 Shortness of breath: Secondary | ICD-10-CM | POA: Diagnosis present

## 2016-04-01 DIAGNOSIS — I248 Other forms of acute ischemic heart disease: Secondary | ICD-10-CM | POA: Diagnosis present

## 2016-04-01 DIAGNOSIS — I4891 Unspecified atrial fibrillation: Secondary | ICD-10-CM

## 2016-04-01 DIAGNOSIS — I5041 Acute combined systolic (congestive) and diastolic (congestive) heart failure: Secondary | ICD-10-CM | POA: Diagnosis present

## 2016-04-01 DIAGNOSIS — R945 Abnormal results of liver function studies: Secondary | ICD-10-CM

## 2016-04-01 DIAGNOSIS — N179 Acute kidney failure, unspecified: Secondary | ICD-10-CM | POA: Diagnosis present

## 2016-04-01 DIAGNOSIS — I4892 Unspecified atrial flutter: Secondary | ICD-10-CM

## 2016-04-01 DIAGNOSIS — I509 Heart failure, unspecified: Secondary | ICD-10-CM

## 2016-04-01 DIAGNOSIS — J810 Acute pulmonary edema: Secondary | ICD-10-CM | POA: Diagnosis present

## 2016-04-01 DIAGNOSIS — R7989 Other specified abnormal findings of blood chemistry: Secondary | ICD-10-CM | POA: Diagnosis present

## 2016-04-01 DIAGNOSIS — I5021 Acute systolic (congestive) heart failure: Secondary | ICD-10-CM

## 2016-04-01 LAB — BASIC METABOLIC PANEL
Anion gap: 12 (ref 5–15)
BUN: 39 mg/dL — ABNORMAL HIGH (ref 6–20)
CO2: 22 mmol/L (ref 22–32)
Calcium: 8.5 mg/dL — ABNORMAL LOW (ref 8.9–10.3)
Chloride: 93 mmol/L — ABNORMAL LOW (ref 101–111)
Creatinine, Ser: 2.2 mg/dL — ABNORMAL HIGH (ref 0.61–1.24)
GFR calc Af Amer: 36 mL/min — ABNORMAL LOW (ref >60)
GFR calc non Af Amer: 31 mL/min — ABNORMAL LOW (ref >60)
Glucose, Bld: 147 mg/dL — ABNORMAL HIGH (ref 65–99)
Potassium: 4.4 mmol/L (ref 3.5–5.1)
Sodium: 127 mmol/L — ABNORMAL LOW (ref 135–145)

## 2016-04-01 LAB — HEPATITIS PANEL, ACUTE
HCV Ab: <0.1 s/co ratio (ref 0.0–0.9)
Hep A IgM: NEGATIVE
Hep B C IgM: NEGATIVE
Hepatitis B Surface Ag: NEGATIVE

## 2016-04-01 LAB — HEPATIC FUNCTION PANEL
ALT: 5950 U/L — ABNORMAL HIGH (ref 17–63)
AST: >2275 U/L — ABNORMAL HIGH (ref 15–41)
Albumin: 3.5 g/dL (ref 3.5–5.0)
Alkaline Phosphatase: 76 U/L (ref 38–126)
Bilirubin, Direct: 1.7 mg/dL — ABNORMAL HIGH (ref 0.1–0.5)
Indirect Bilirubin: 1.7 mg/dL — ABNORMAL HIGH (ref 0.3–0.9)
Total Bilirubin: 3.4 mg/dL — ABNORMAL HIGH (ref 0.3–1.2)
Total Protein: 6 g/dL — ABNORMAL LOW (ref 6.5–8.1)

## 2016-04-01 LAB — URINE DRUG SCREEN, QUALITATIVE (ARMC ONLY)
Amphetamines, Ur Screen: NOT DETECTED
Barbiturates, Ur Screen: NOT DETECTED
Benzodiazepine, Ur Scrn: NOT DETECTED
Cannabinoid 50 Ng, Ur ~~LOC~~: NOT DETECTED
Cocaine Metabolite,Ur ~~LOC~~: NOT DETECTED
MDMA (Ecstasy)Ur Screen: NOT DETECTED
Methadone Scn, Ur: NOT DETECTED
Opiate, Ur Screen: NOT DETECTED
Phencyclidine (PCP) Ur S: NOT DETECTED
Tricyclic, Ur Screen: NOT DETECTED

## 2016-04-01 LAB — CBC
HCT: 38.9 % — ABNORMAL LOW (ref 40.0–52.0)
Hemoglobin: 13.2 g/dL (ref 13.0–18.0)
MCH: 33.2 pg (ref 26.0–34.0)
MCHC: 33.8 g/dL (ref 32.0–36.0)
MCV: 98.1 fL (ref 80.0–100.0)
Platelets: 211 10*3/uL (ref 150–440)
RBC: 3.96 MIL/uL — ABNORMAL LOW (ref 4.40–5.90)
RDW: 14.6 % — ABNORMAL HIGH (ref 11.5–14.5)
WBC: 21.8 10*3/uL — ABNORMAL HIGH (ref 3.8–10.6)

## 2016-04-01 LAB — PHOSPHORUS: Phosphorus: 5.8 mg/dL — ABNORMAL HIGH (ref 2.5–4.6)

## 2016-04-01 LAB — MAGNESIUM: Magnesium: 2.7 mg/dL — ABNORMAL HIGH (ref 1.7–2.4)

## 2016-04-01 MED ORDER — FOLIC ACID 1 MG PO TABS
1.0000 mg | ORAL_TABLET | Freq: Every day | ORAL | Status: DC
  Administered 2016-04-01 – 2016-04-02 (×2): 1 mg via ORAL
  Filled 2016-04-01 (×2): qty 1

## 2016-04-01 MED ORDER — VITAMIN B-1 100 MG PO TABS
100.0000 mg | ORAL_TABLET | Freq: Every day | ORAL | Status: DC
  Administered 2016-04-01 – 2016-04-02 (×2): 100 mg via ORAL
  Filled 2016-04-01 (×2): qty 1

## 2016-04-01 MED ORDER — THIAMINE HCL 100 MG/ML IJ SOLN
100.0000 mg | Freq: Every day | INTRAMUSCULAR | Status: DC
  Filled 2016-04-01: qty 2

## 2016-04-01 MED ORDER — ALPRAZOLAM 1 MG PO TABS
1.0000 mg | ORAL_TABLET | Freq: Three times a day (TID) | ORAL | Status: DC
  Administered 2016-04-01 – 2016-04-02 (×5): 1 mg via ORAL
  Filled 2016-04-01 (×5): qty 1

## 2016-04-01 MED ORDER — LORAZEPAM 2 MG PO TABS
0.0000 mg | ORAL_TABLET | Freq: Four times a day (QID) | ORAL | Status: DC
  Administered 2016-04-02: 2 mg via ORAL
  Filled 2016-04-01: qty 1

## 2016-04-01 MED ORDER — LORAZEPAM 2 MG PO TABS: 0.0000 mg | ORAL_TABLET | Freq: Two times a day (BID) | ORAL | Status: DC

## 2016-04-01 MED ORDER — METOPROLOL TARTRATE 1 MG/ML IV SOLN
2.5000 mg | Freq: Four times a day (QID) | INTRAVENOUS | Status: DC | PRN
Start: 2016-04-01 — End: 2016-04-02
  Administered 2016-04-02: 2.5 mg via INTRAVENOUS
  Filled 2016-04-01: qty 5

## 2016-04-01 MED ORDER — SODIUM CHLORIDE 0.9% FLUSH: 10.0000 mL | INTRAVENOUS | Status: DC | PRN

## 2016-04-01 MED ORDER — LORAZEPAM 2 MG/ML IJ SOLN: 1.0000 mg | Freq: Four times a day (QID) | INTRAMUSCULAR | Status: DC | PRN

## 2016-04-01 MED ORDER — LORAZEPAM 0.5 MG PO TABS: 1.0000 mg | ORAL_TABLET | Freq: Four times a day (QID) | ORAL | Status: DC | PRN

## 2016-04-01 MED ORDER — DOBUTAMINE IN D5W 4-5 MG/ML-% IV SOLN
2.5000 ug/kg/min | INTRAVENOUS | Status: DC
  Administered 2016-04-01: 2.5 ug/kg/min via INTRAVENOUS
  Filled 2016-04-01: qty 250

## 2016-04-01 MED ORDER — ADULT MULTIVITAMIN W/MINERALS CH
1.0000 | ORAL_TABLET | Freq: Every day | ORAL | Status: DC
  Administered 2016-04-01 – 2016-04-02 (×2): 1 via ORAL
  Filled 2016-04-01 (×2): qty 1

## 2016-04-01 MED ORDER — DOPAMINE-DEXTROSE 3.2-5 MG/ML-% IV SOLN
0.0000 ug/kg/min | INTRAVENOUS | Status: DC
  Administered 2016-04-01: 2 ug/kg/min via INTRAVENOUS
  Filled 2016-04-01: qty 250

## 2016-04-01 NOTE — Progress Notes (Signed)
Patient: Caleb Barnes / Admit Date: 03/31/2016 / Date of Encounter: 04/01/2016, 8:03 AM   Subjective: No complaints overnight, feels somewhat better today, less shortness of breath overnight Continues to have anorexia, food does not seem appealing, mild GI upset Telemetry reviewed showing very short runs of nonsustained VT Dramatic change in lab work this morning  Review of Systems: Review of Systems  Constitutional: Negative.   Respiratory: Negative.   Cardiovascular: Negative.   Gastrointestinal: Positive for nausea.  Musculoskeletal: Negative.   Neurological: Negative.   Psychiatric/Behavioral: Negative.   All other systems reviewed and are negative.   Objective: Telemetry: Normal sinus rhythm, very short runs of nonsustained VT, short runs of atrial tachyarrhythmia Physical Exam: Blood pressure 103/80, pulse 84, temperature 98.4 F (36.9 C), temperature source Oral, resp. rate 18, height 5\' 11"  (1.803 m), weight 168 lb 6.9 oz (76.4 kg), SpO2 97 %. Body mass index is 23.5 kg/(m^2). General: Well developed, well nourished, in no acute distress. Head: Normocephalic, atraumatic, sclera non-icteric, no xanthomas, nares are without discharge. Neck: Negative for carotid bruits. JVP not elevated. Lungs: Clear bilaterally to auscultation without wheezes, rales, or rhonchi. Breathing is unlabored. Heart: RRR S1 S2 with 2+murmurs, no rubs, or gallops.  Abdomen: Soft, non-tender, non-distended with normoactive bowel sounds. No rebound/guarding. Extremities: No clubbing or cyanosis. No edema. Distal pedal pulses are 2+ and equal bilaterally. Neuro: Alert and oriented X 3. Moves all extremities spontaneously. Psych:  Responds to questions appropriately with a normal affect.   Intake/Output Summary (Last 24 hours) at 04/01/16 0803 Last data filed at 04/01/16 0600  Gross per 24 hour  Intake  710.3 ml  Output    550 ml  Net  160.3 ml    Inpatient Medications:  . antiseptic oral rinse   7 mL Mouth Rinse BID  . aspirin  81 mg Oral Daily  . furosemide  40 mg Intravenous Q12H  . tiotropium  18 mcg Inhalation Daily   Infusions:  . amiodarone 30 mg/hr (04/01/16 0646)  . DOBUTamine    . DOPamine      Labs:  Recent Labs  03/31/16 0658 03/31/16 1438 04/01/16 0447  NA 132*  --  127*  K 3.5  --  4.4  CL 99*  --  93*  CO2 23  --  22  GLUCOSE 146*  --  147*  BUN 19  --  39*  CREATININE 1.12  --  2.20*  CALCIUM 8.9  --  8.5*  MG <0.1* 2.9* 2.7*  PHOS  --   --  5.8*    Recent Labs  03/31/16 0658 04/01/16 0447  AST 785* >2275*  ALT 828* 5950*  ALKPHOS 81 76  BILITOT 2.9* 3.4*  PROT 7.4 6.0*  ALBUMIN 4.1 3.5    Recent Labs  03/31/16 0658 04/01/16 0447  WBC 11.4* 21.8*  HGB 13.4 13.2  HCT 40.4 38.9*  MCV 98.0 98.1  PLT 276 211    Recent Labs  03/31/16 0658 03/31/16 1245 03/31/16 1739  TROPONINI 0.17* 0.20* 0.19*   Invalid input(s): POCBNP No results for input(s): HGBA1C in the last 72 hours.   Weights: Filed Weights   03/31/16 2751 03/31/16 1252  Weight: 170 lb (77.111 kg) 168 lb 6.9 oz (76.4 kg)     Radiology/Studies:  Ct Angio Chest Pe W/cm &/or Wo Cm  03/31/2016  CLINICAL DATA: IMPRESSION: Marked cardiomegaly with vascular congestion and probable perihilar interstitial edema. Small bilateral effusions, right greater than left. No evidence of pulmonary  embolus. Electronically Signed   By: Charlett Nose M.D.   On: 03/31/2016 10:34   Dg Chest Portable 1 View  03/31/2016  CLINICAL DATA:   IMPRESSION: Congestive heart failure with pulmonary interstitial edema and small left pleural effusion. Electronically Signed   By: Maisie Fus  Register   On: 03/31/2016 08:08   US Abdomen Limited Ruq  03/31/2016  CLINICAL DATA: IMPRESSION: Gallbladder wall thickening without cholelithiasis or biliary dilatation. Gallbladder wall thickening is most likely related to hepatic dysfunction or congestive heart failure/ascites. Recommend nuclear medicine study if  there is strong clinical suspicion for acute cholecystitis. Trace ascites. Unremarkable liver. Electronically Signed   By: Harmon Pier M.D.   On: 03/31/2016 14:11     Assessment and Plan  60 y.o. male   1. New onset atrial flutter with RVR:  Converted to NSR, on  amiodarone infusion He will benefit from anticoagulation given his severe dilated cardiomyopathy,  history of paroxysmal arrhythmia per the patient --- Given severe climb in LFTs, we'll hold amiodarone infusion for now , discussed with pharmacy --- High risk of recurrent arrhythmia  2. Acute systolic heart failure: -Possibly tachy-mediated, unable to exclude alcohol given his long history Also will require ischemia workup given long smoking history though on CT scan of the chest there is minimal calcified atherosclerotic plaque. ----Minimal urine output, worsening renal function concerning for cardiorenal syndrome, worsening LFTs concerning for hepatic congestion. --Would recommend starting low-dose dopamine, dobutamine fo inotropic support of his cardiomyopathy Discussed with nurses, he will need additional IV access  Discussed with intensivist, plan for central line  3. Elevated troponin: Likely secondary to tachycardia , cardiomyopathy CT scan of the chest with minimal calcified coronary plaquing, less likely ischemia Currently with no plan for catheterization given renal dysfunction  4. Abnormal LFTs/possible congestive hepatopathy: -RUQ ultrasound pending We'll start inotropes to augment cardiac output for cardiorenal syndrome  5. ETOH abuse: Patient ports prior history of alcohol abuse, Unclear if this is playing a role in his cardiomyopathy Last drink one week ago    Total encounter time more than 35 minutes  Greater than 50% was spent in counseling and coordination of care with the patient   Signed, Dossie Arbour, MD, Ph.D. [facility] 04/01/2016, 8:03 AM

## 2016-04-01 NOTE — Progress Notes (Signed)
Pharmacy Consult for Electrolyte Monitoring   No Known Allergies  Patient Measurements: Height: 5\' 11"  (180.3 cm) Weight: 168 lb 6.9 oz (76.4 kg) IBW/kg (Calculated) : 75.3  Vital Signs: Temp: 98.4 F (36.9 C) (04/04 0800) BP: 121/78 mmHg (04/04 1400) Pulse Rate: 96 (04/04 1400) Intake/Output from previous day: 04/03 0701 - 04/04 0700 In: 710.3 [P.O.:360; I.V.:350.3] Out: 550 [Urine:550] Intake/Output from this shift: Total I/O In: 62.7 [I.V.:62.7] Out: 1000 [Urine:1000]  Labs:  Recent Labs  03/31/16 0658 03/31/16 1245 04/01/16 0447  WBC 11.4*  --  21.8*  HGB 13.4  --  13.2  HCT 40.4  --  38.9*  PLT 276  --  211  INR  --  1.84  --      Recent Labs  03/31/16 0658 03/31/16 1438 04/01/16 0447  NA 132*  --  127*  K 3.5  --  4.4  CL 99*  --  93*  CO2 23  --  22  GLUCOSE 146*  --  147*  BUN 19  --  39*  CREATININE 1.12  --  2.20*  CALCIUM 8.9  --  8.5*  MG <0.1* 2.9* 2.7*  PHOS  --   --  5.8*  PROT 7.4  --  6.0*  ALBUMIN 4.1  --  3.5  AST 785*  --  >2275*  ALT 828*  --  5950*  ALKPHOS 81  --  76  BILITOT 2.9*  --  3.4*  BILIDIR  --   --  1.7*  IBILI  --   --  1.7*   Estimated Creatinine Clearance: 38.5 mL/min (by C-G formula based on Cr of 2.2).    Recent Labs  03/31/16 1255  GLUCAP 152*    Medical History: Past Medical History  Diagnosis Date  . COPD (chronic obstructive pulmonary disease) (HCC)   . CHF (congestive heart failure) (HCC)   . Polysubstance abuse   . Shortness of breath dyspnea   . Anxiety   . Acute renal failure (HCC)     Medications:  Scheduled:  . ALPRAZolam  1 mg Oral TID  . antiseptic oral rinse  7 mL Mouth Rinse BID  . aspirin  81 mg Oral Daily  . folic acid  1 mg Oral Daily  . furosemide  40 mg Intravenous Q12H  . LORazepam  0-4 mg Oral Q6H   Followed by  . [START ON 04/03/2016] LORazepam  0-4 mg Oral Q12H  . multivitamin with minerals  1 tablet Oral Daily  . thiamine  100 mg Oral Daily   Or  . thiamine  100  mg Intravenous Daily  . tiotropium  18 mcg Inhalation Daily   Infusions:  . DOBUTamine 2.5 mcg/kg/min (04/01/16 1149)  . DOPamine 2 mcg/kg/min (04/01/16 1150)    Assessment: Pharmacy consulted to assist in managing electrolytes in this 60 y/o M with afib.   Plan:  No electrolyte supplementation warranted today. Will f/u AM labs.   Luisa Hart D 04/01/2016,4:04 PM

## 2016-04-01 NOTE — Progress Notes (Signed)
[employer] [facility] - [place] [facility]   PATIENT NAME: Caleb Barnes    MR#:  [id number]  DATE OF BIRTH:  August 16, 1956  SUBJECTIVE:  CHIEF COMPLAINT:   Chief Complaint  Patient presents with  . Shortness of Breath   Came with progressive worsening shortness of breath and palpitation episodes for last few weeks.  Found to have atrial fibrillation with rapid ventricular response and congestive heart failure.  Also had worsening in the liver function and renal function today.   Patient is completely alert and without any discomfort or complaints. REVIEW OF SYSTEMS:  CONSTITUTIONAL: No fever, fatigue or weakness.  EYES: No blurred or double vision.  EARS, NOSE, AND THROAT: No tinnitus or ear pain.  RESPIRATORY: No cough, shortness of breath, wheezing or hemoptysis.  CARDIOVASCULAR: No chest pain, orthopnea, edema.  GASTROINTESTINAL: No nausea, vomiting, diarrhea or abdominal pain.  GENITOURINARY: No dysuria, hematuria.  ENDOCRINE: No polyuria, nocturia,  HEMATOLOGY: No anemia, easy bruising or bleeding SKIN: No rash or lesion. MUSCULOSKELETAL: No joint pain or arthritis.   NEUROLOGIC: No tingling, numbness, weakness.  PSYCHIATRY: No anxiety or depression.   ROS  DRUG ALLERGIES:  No Known Allergies  VITALS:  Blood pressure 121/78, pulse 96, temperature 98.4 F (36.9 C), temperature source Oral, resp. rate 21, height 5\' 11"  (1.803 m), weight 76.4 kg (168 lb 6.9 oz), SpO2 96 %.  PHYSICAL EXAMINATION:   GENERAL: 60 y.o.-year-old patient lying in the bed with no acute distress.  EYES: Pupils equal, round, reactive to light and accommodation. No scleral icterus. Extraocular muscles intact.  HEENT: Head atraumatic, normocephalic. Oropharynx and nasopharynx clear.  NECK: Supple, no jugular venous distention. No thyroid enlargement, no tenderness.  LUNGS: Normal breath sounds bilaterally, no wheezing, Some crepitation. No use of accessory muscles of respiration.   CARDIOVASCULAR: S1, S2 regular, tachy, No murmurs, rubs, or gallops.  ABDOMEN: Soft, nontender, nondistended. Bowel sounds present. No organomegaly or mass.  EXTREMITIES: No pedal edema, cyanosis, or clubbing.  NEUROLOGIC: Cranial nerves II through XII are intact. Muscle strength 5/5 in all extremities. Sensation intact. Gait not checked.  PSYCHIATRIC: The patient is alert and oriented x 3.  SKIN: No obvious rash, lesion, or ulcer.   Physical Exam LABORATORY PANEL:   CBC  Recent Labs Lab 04/01/16 0447  WBC 21.8*  HGB 13.2  HCT 38.9*  PLT 211   ------------------------------------------------------------------------------------------------------------------  Chemistries   Recent Labs Lab 04/01/16 0447  NA 127*  K 4.4  CL 93*  CO2 22  GLUCOSE 147*  BUN 39*  CREATININE 2.20*  CALCIUM 8.5*  MG 2.7*  AST >2275*  ALT 5950*  ALKPHOS 76  BILITOT 3.4*   ------------------------------------------------------------------------------------------------------------------  Cardiac Enzymes  Recent Labs Lab 03/31/16 1245 03/31/16 1739  TROPONINI 0.20* 0.19*   ------------------------------------------------------------------------------------------------------------------  RADIOLOGY:  Ct Angio Chest Pe W/cm &/or Wo Cm  03/31/2016  CLINICAL DATA:  Difficulty sleeping, shortness of breath since Friday. EXAM: CT ANGIOGRAPHY CHEST WITH CONTRAST TECHNIQUE: Multidetector CT imaging of the chest was performed using the standard protocol during bolus administration of intravenous contrast. Multiplanar CT image reconstructions and MIPs were obtained to evaluate the vascular anatomy. CONTRAST:  100 cc Isovue 370 IV COMPARISON:  Chest x-ray performed today. FINDINGS: No filling defects in the pulmonary arteries to suggest pulmonary emboli. Small bilateral pleural effusions, right larger than left. Mild vascular congestion and central interstitial prominence could reflect early  interstitial edema. Ground-glass opacities in the posterior lower lobes could reflect edema or atelectasis. Marked cardiomegaly. Aorta  is normal caliber. Borderline size mediastinal and bilateral hilar lymph nodes may be related to congestion. Chest wall soft tissues are unremarkable. Imaging into the upper abdomen shows no acute findings. Review of the MIP images confirms the above findings. IMPRESSION: Marked cardiomegaly with vascular congestion and probable perihilar interstitial edema. Small bilateral effusions, right greater than left. No evidence of pulmonary embolus. Electronically Signed   By: Charlett Nose M.D.   On: 03/31/2016 10:34   [place] Art/ven Flow Abd Pelv Doppler Limited  04/01/2016  CLINICAL DATA:  Elevated LFTs. EXAM: DUPLEX ULTRASOUND OF LIVER TECHNIQUE: Color and duplex Doppler ultrasound was performed to evaluate the hepatic in-flow and out-flow vessels. COMPARISON:  Ultrasound and CT from the previous day FINDINGS: Portal Vein 1.3 cm diameter. No evidence of occlusion or thrombus. Velocities (all hepatopetal): Main:  8-17 cm/sec Right:  16 cm/sec Left:  10 cm/sec Hepatic Vein Velocities (all hepatofugal): Right:  53 cm/sec Middle:  24 cm/sec Left:  19 cm/sec Intrahepatic IVC is patent, velocity 33 cm/second. Hepatic Artery Velocity:  61 cm/sec Spleen 9.3 x 10 x 3.7 cm. Splenic Vein shows no evidence of occlusion or thrombus. Velocity: 13 cm/sec Varices: None seen Ascites: Trace perisplenic and perihepatic IMPRESSION: 1. Normal liver vascular assessment. 2. Trace abdominal ascites. Electronically Signed   By: Corlis Leak M.D.   On: 04/01/2016 12:19   Dg Chest Port 1 View  04/01/2016  CLINICAL DATA:  Evaluate central line placement EXAM: PORTABLE CHEST 1 VIEW COMPARISON:  03/31/2016 FINDINGS: Left Central line is in place with the tip at the cavoatrial junction. No pneumothorax. Cardiomegaly. Mild vascular congestion. No confluent airspace opacities, effusions or overt edema. No acute bony  abnormality. IMPRESSION: Cardiomegaly, vascular congestion. Electronically Signed   By: Charlett Nose M.D.   On: 04/01/2016 10:39   Dg Chest Portable 1 View  03/31/2016  CLINICAL DATA:  Shortness of breath. EXAM: PORTABLE CHEST 1 VIEW COMPARISON:  No prior. FINDINGS: Cardiomegaly with pulmonary vascular prominence and bilateral interstitial prominence with small left pleural effusion. Findings consistent with congestive heart failure. IMPRESSION: Congestive heart failure with pulmonary interstitial edema and small left pleural effusion. Electronically Signed   By: Maisie Fus  Register   On: 03/31/2016 08:08   US Abdomen Limited Ruq  03/31/2016  CLINICAL DATA:  60 year old male with elevated LFTs. Patient currently with CHF. EXAM: US ABDOMEN LIMITED - RIGHT UPPER QUADRANT COMPARISON:  None. FINDINGS: Gallbladder: Gallbladder wall thickening is noted with small amount of pericholecystic fluid. There is no evidence of cholelithiasis or sonographic Murphy sign. Common bile duct: Diameter: 3.2 mm. There is no evidence of intrahepatic or extrahepatic biliary dilatation. Liver: No focal lesion identified. Within normal limits in parenchymal echogenicity. A trace amount of ascites is noted. IMPRESSION: Gallbladder wall thickening without cholelithiasis or biliary dilatation. Gallbladder wall thickening is most likely related to hepatic dysfunction or congestive heart failure/ascites. Recommend nuclear medicine study if there is strong clinical suspicion for acute cholecystitis. Trace ascites. Unremarkable liver. Electronically Signed   By: Harmon Pier M.D.   On: 03/31/2016 14:11    ASSESSMENT AND PLAN:   Active Problems:   Atrial fibrillation with rapid ventricular response (HCC)   Acute congestive heart failure (HCC)   Acute pulmonary edema (HCC)   Atrial flutter (HCC)   Elevated LFTs   Elevated troponin   SOB (shortness of breath)   Acute systolic CHF (congestive heart failure) (HCC)   Acute renal failure  (HCC)  * A fib with RVR required IV cardizem drip,  now HR under control   Echo- EF 25%.  Cardio to decide anticoagulation.  Mg is low- given IV replacement.  normal TSH.   Too high risk for anticoagulation duet o shock liver now.  * Shock liver    Due to CHF   Cardio decided to give dopamin and dobutamine drip.   Monitor LFT.   GI consult appreciated, other tests also sent by GI.  * hypomagnesemia\  Replaced IV. Normal  * Ac systolic CHF  IV lasix.  * Elevated D dimer  Checked CT angio- negative for PE.  * COPD  Will give Duoneb.  No exacerbation at this time.   Pulmonary now on case.  * ARF   Due to decreased perfusion   On vasopressor per cardio.    All the records are reviewed and case discussed with Care Management/Social Workerr. Management plans discussed with the patient, family and they are in agreement.  CODE STATUS: Full  TOTAL TIME TAKING CARE OF THIS PATIENT: 40 critical care minutes.    POSSIBLE D/C IN 2-3 DAYS, DEPENDING ON CLINICAL CONDITION.   Altamese Dilling M.D on 04/01/2016   Between 7am to 6pm - Pager - [phone]  After 6pm go to [email or web address] - Social research officer, government  [facility]  Office  [phone]  CC: Primary care physician; [facility]  Note: This dictation was prepared with Dragon dictation along with smaller phrase technology. Any transcriptional errors that result from this process are unintentional.

## 2016-04-01 NOTE — Consult Note (Signed)
[facility] [place] Pulmonary Medicine Consultation      Name: Caleb Barnes MRN: [id number] DOB: Feb 25, 1956    ADMISSION DATE:  03/31/2016 CONSULTATION DATE:  04/01/16  REFERRING MD : Dr. Charlann Lange   CHIEF COMPLAINT:   Acute SOB   HISTORY OF PRESENT ILLNESS  60 y.o. male with a known history of Chronic smoking, stopped smoking 3 years ago. And chronic ETOH abuse - He had complain of feeling shortness of breath especially at nighttime and lower ext swelling -He went to his primary care doctor last month and she prescribed prednisone and azithromycin with oral Lasix.  -he was itold that he has copd and possibly congestive heart failure. The patient felt better after finishing that course for 1 or 2 weeks - then again for last 1 week started having the SOB adn ankle swelling -He has to stay up in the night mostly in the recliner(orthopnea) -He denies any chest pain but has some palpitation episodes on and off.  -Came to emergency room today and he was noted to have atrial fibrillation with rapid ventricular response, so started on amiodarone drip  -Lab work with acute renal failure and acute liver dysfunction     PAST MEDICAL HISTORY    :  Past Medical History  Diagnosis Date  . COPD (chronic obstructive pulmonary disease) (HCC)   . CHF (congestive heart failure) (HCC)   . Polysubstance abuse   . Shortness of breath dyspnea   . Anxiety   . Acute renal failure (HCC)    History reviewed. No pertinent past surgical history. Prior to Admission medications   Medication Sig Start Date End Date Taking? Authorizing Provider  albuterol (PROVENTIL HFA;VENTOLIN HFA) 108 (90 Base) MCG/ACT inhaler Inhale 1 puff into the lungs every 4 (four) hours as needed for wheezing or shortness of breath.   Yes Historical Provider, MD  aspirin 81 MG chewable tablet Chew 81 mg by mouth daily.   Yes Historical Provider, MD  doxycycline (VIBRA-TABS) 100 MG tablet Take 100 mg by mouth 2 (two) times daily. For 10  days 03/16/16  Yes Historical Provider, MD  tiotropium (SPIRIVA) 18 MCG inhalation capsule Place 18 mcg into inhaler and inhale daily.   Yes Historical Provider, MD   No Known Allergies   FAMILY HISTORY   Family History  Problem Relation Age of Onset  . CAD Father       SOCIAL HISTORY    reports that he has quit smoking. He has never used smokeless tobacco. He reports that he does not drink alcohol or use illicit drugs.  Review of Systems  Constitutional: Negative for fever, chills, weight loss, malaise/fatigue and diaphoresis.  HENT: Negative for congestion and hearing loss.   Eyes: Negative for blurred vision and double vision.  Respiratory: Positive for shortness of breath. Negative for cough, hemoptysis, sputum production and wheezing.   Cardiovascular: Positive for leg swelling. Negative for chest pain, palpitations and orthopnea.  Gastrointestinal: Negative for heartburn, nausea, vomiting, abdominal pain, diarrhea, constipation and blood in stool.  Genitourinary: Negative for dysuria and urgency.  Musculoskeletal: Negative for myalgias, back pain and neck pain.  Skin: Negative for rash.  Neurological: Negative for dizziness, tingling, tremors, weakness and headaches.  Endo/Heme/Allergies: Does not bruise/bleed easily.  Psychiatric/Behavioral: Negative for depression, suicidal ideas and substance abuse.  All other systems reviewed and are negative.     VITAL SIGNS    Temp:  [97.7 F (36.5 C)-98.4 F (36.9 C)] 98.4 F (36.9 C) (04/04 0800) Pulse Rate:  [33-134]  83 (04/04 0800) Resp:  [12-30] 17 (04/04 0800) BP: (89-121)/(60-98) 107/83 mmHg (04/04 0800) SpO2:  [94 %-100 %] 97 % (04/04 0800) Weight:  [168 lb 6.9 oz (76.4 kg)] 168 lb 6.9 oz (76.4 kg) (04/03 1252) HEMODYNAMICS:   VENTILATOR SETTINGS:   INTAKE / OUTPUT:  Intake/Output Summary (Last 24 hours) at 04/01/16 0944 Last data filed at 04/01/16 0600  Gross per 24 hour  Intake  710.3 ml  Output    550  ml  Net  160.3 ml       PHYSICAL EXAM   Physical Exam  Constitutional: He is oriented to person, place, and time. He appears well-developed and well-nourished. No distress.  HENT:  Head: Normocephalic and atraumatic.  Mouth/Throat: No oropharyngeal exudate.  Eyes: EOM are normal. Pupils are equal, round, and reactive to light. No scleral icterus.  Neck: Normal range of motion. Neck supple.  Cardiovascular: Normal rate, regular rhythm and normal heart sounds.   No murmur heard. Pulmonary/Chest: No stridor. No respiratory distress. He has no wheezes. He has rales.  Abdominal: Soft. Bowel sounds are normal. He exhibits no distension. There is no tenderness. There is no rebound.  Musculoskeletal: Normal range of motion. He exhibits edema.  Neurological: He is alert and oriented to person, place, and time.  Skin: Skin is warm. He is not diaphoretic.  Psychiatric: He has a normal mood and affect.       LABS   LABS:  CBC  Recent Labs Lab 03/31/16 0658 04/01/16 0447  WBC 11.4* 21.8*  HGB 13.4 13.2  HCT 40.4 38.9*  PLT 276 211   Coag's  Recent Labs Lab 03/31/16 1245  INR 1.84   BMET  Recent Labs Lab 03/31/16 0658 04/01/16 0447  NA 132* 127*  K 3.5 4.4  CL 99* 93*  CO2 23 22  BUN 19 39*  CREATININE 1.12 2.20*  GLUCOSE 146* 147*   Electrolytes  Recent Labs Lab 03/31/16 0658 03/31/16 1438 04/01/16 0447  CALCIUM 8.9  --  8.5*  MG <0.1* 2.9* 2.7*  PHOS  --   --  5.8*   Sepsis Markers No results for input(s): LATICACIDVEN, PROCALCITON, O2SATVEN in the last 168 hours. ABG No results for input(s): PHART, PCO2ART, PO2ART in the last 168 hours. Liver Enzymes  Recent Labs Lab 03/31/16 0658 04/01/16 0447  AST 785* >2275*  ALT 828* 5950*  ALKPHOS 81 76  BILITOT 2.9* 3.4*  ALBUMIN 4.1 3.5   Cardiac Enzymes  Recent Labs Lab 03/31/16 0658 03/31/16 1245 03/31/16 1739  TROPONINI 0.17* 0.20* 0.19*   Glucose  Recent Labs Lab 03/31/16 1255    GLUCAP 152*     Recent Results (from the past 240 hour(s))  MRSA PCR Screening     Status: None   Collection Time: 03/31/16 12:54 PM  Result Value Ref Range Status   MRSA by PCR NEGATIVE NEGATIVE Final    Comment:        The GeneXpert MRSA Assay (FDA approved for NASAL specimens only), is one component of a comprehensive MRSA colonization surveillance program. It is not intended to diagnose MRSA infection nor to guide or monitor treatment for MRSA infections.      Current facility-administered medications:  .  ALPRAZolam (XANAX) tablet 1 mg, 1 mg, Oral, TID, Erin Fulling, MD .  antiseptic oral rinse (CPC / CETYLPYRIDINIUM CHLORIDE 0.05%) solution 7 mL, 7 mL, Mouth Rinse, BID, Altamese Dilling, MD, 7 mL at 03/31/16 2200 .  aspirin chewable tablet 81 mg, 81 mg,  Oral, Daily, Altamese Dilling, MD, 81 mg at 03/31/16 1356 .  DOBUTamine (DOBUTREX) infusion 4000 mcg/mL, 2.5-10 mcg/kg/min, Intravenous, Titrated, Antonieta Iba, MD .  DOPamine (INTROPIN) 800 mg in dextrose 5 % 250 mL (3.2 mg/mL) infusion, 0-5 mcg/kg/min, Intravenous, Titrated, Antonieta Iba, MD .  furosemide (LASIX) injection 40 mg, 40 mg, Intravenous, Q12H, Iran Ouch, MD, 40 mg at 03/31/16 2112 .  ipratropium-albuterol (DUONEB) 0.5-2.5 (3) MG/3ML nebulizer solution 3 mL, 3 mL, Nebulization, Q6H PRN, Altamese Dilling, MD .  tiotropium (SPIRIVA) inhalation capsule 18 mcg, 18 mcg, Inhalation, Daily, Altamese Dilling, MD, 18 mcg at 03/31/16 1358  IMAGING    Ct Angio Chest Pe W/cm &/or Wo Cm  03/31/2016  CLINICAL DATA:  Difficulty sleeping, shortness of breath since Friday. EXAM: CT ANGIOGRAPHY CHEST WITH CONTRAST TECHNIQUE: Multidetector CT imaging of the chest was performed using the standard protocol during bolus administration of intravenous contrast. Multiplanar CT image reconstructions and MIPs were obtained to evaluate the vascular anatomy. CONTRAST:  100 cc Isovue 370 IV COMPARISON:   Chest x-ray performed today. FINDINGS: No filling defects in the pulmonary arteries to suggest pulmonary emboli. Small bilateral pleural effusions, right larger than left. Mild vascular congestion and central interstitial prominence could reflect early interstitial edema. Ground-glass opacities in the posterior lower lobes could reflect edema or atelectasis. Marked cardiomegaly. Aorta is normal caliber. Borderline size mediastinal and bilateral hilar lymph nodes may be related to congestion. Chest wall soft tissues are unremarkable. Imaging into the upper abdomen shows no acute findings. Review of the MIP images confirms the above findings. IMPRESSION: Marked cardiomegaly with vascular congestion and probable perihilar interstitial edema. Small bilateral effusions, right greater than left. No evidence of pulmonary embolus. Electronically Signed   By: Charlett Nose M.D.   On: 03/31/2016 10:34   US Abdomen Limited Ruq  03/31/2016  CLINICAL DATA:  60 year old male with elevated LFTs. Patient currently with CHF. EXAM: US ABDOMEN LIMITED - RIGHT UPPER QUADRANT COMPARISON:  None. FINDINGS: Gallbladder: Gallbladder wall thickening is noted with small amount of pericholecystic fluid. There is no evidence of cholelithiasis or sonographic Murphy sign. Common bile duct: Diameter: 3.2 mm. There is no evidence of intrahepatic or extrahepatic biliary dilatation. Liver: No focal lesion identified. Within normal limits in parenchymal echogenicity. A trace amount of ascites is noted. IMPRESSION: Gallbladder wall thickening without cholelithiasis or biliary dilatation. Gallbladder wall thickening is most likely related to hepatic dysfunction or congestive heart failure/ascites. Recommend nuclear medicine study if there is strong clinical suspicion for acute cholecystitis. Trace ascites. Unremarkable liver. Electronically Signed   By: Harmon Pier M.D.   On: 03/31/2016 14:11      Reason to continue [employer] Monitoring of  central venous pressure or other hemodynamic parameters    ECHO verbal report by cardiology is EF 10% INDWELLING DEVICES::LEFT IJ CVL>>>4/4  MICRO DATA: MRSA PCR NEGATIVE Urine  Blood Resp      ASSESSMENT/PLAN  60 yo white male with chronic ETOH and tobacco abuse with acute CHF with acute cardiorenal syndrome with acute liver dysfunction Patient with multi-organ dysfunction. centrel line placed for IV dobutamine and dopamine  PULMONARY Oxygen as needed  CARDIOVASCULAR Start dopamine and dobutamine per cardiology  RENAL arf from ATN -follow chem 7 follow UO  GASTROINTESTINAL Diet as tolerated  HEMATOLOGIC Follow CBC Transfusion guidelines       I have personally obtained a history, examined the patient, evaluated laboratory and independently reviewed  imaging results, formulated the assessment and plan and  placed orders.  The Patient requires high complexity decision making for assessment and support, frequent evaluation and titration of therapies, application of advanced monitoring technologies and extensive interpretation of multiple databases. Overall,  prognosis is guarded.  Lucie Leather, M.D.  [facility]  Medical Director [facility] Medical Director [facility] Cardio-Pulmonary Department

## 2016-04-01 NOTE — Procedures (Signed)
Central Venous Catheter Placement: Indication: Patient receiving vesicant or irritant drug.; Patient receiving intravenous therapy for longer than 5 days.; Patient has limited or no vascular access.   Consent:verbal/written  Risks and benefits explained in detail including risk of infection, bleeding, respiratory failure and death..   Hand washing performed prior to starting the procedure.   Procedure: An active timeout was performed and correct patient, name, & ID confirmed.  After explaining risk and benefits, patient was positioned correctly for central venous access. Patient was prepped using strict sterile technique including chlorohexadine preps, sterile drape, sterile gown and sterile gloves.  The area was prepped, draped and anesthetized in the usual sterile manner. Patient comfort was obtained.  A triple lumen catheter was placed in LEFT Internal Jugular Vein There was good blood return, catheter caps were placed on lumens, catheter flushed easily, the line was secured and a sterile dressing and BIO-PATCH applied.   Ultrasound was used to visualize vasculature and guidance of needle.   Number of Attempts: 1 Complications:none Estimated Blood Loss: none Chest Radiograph indicated and ordered.  Operator: Belicia Difatta.   Berdia Lachman David Cheryln Balcom, M.D.  [place] [facility]  Medical Director [facility] Medical Director [facility] Cardio-Pulmonary Department     

## 2016-04-02 ENCOUNTER — Inpatient Hospital Stay (HOSPITAL_COMMUNITY)
Admission: AD | Admit: 2016-04-02 | Discharge: 2016-04-08 | DRG: 287 | Disposition: A | Discharge status: Home / Self Care | Payer: 59 | Source: Other Acute Inpatient Hospital | Attending: Cardiology | Admitting: Cardiology

## 2016-04-02 DIAGNOSIS — J810 Acute pulmonary edema: Secondary | ICD-10-CM

## 2016-04-02 DIAGNOSIS — I4892 Unspecified atrial flutter: Secondary | ICD-10-CM | POA: Diagnosis present

## 2016-04-02 DIAGNOSIS — E871 Hypo-osmolality and hyponatremia: Secondary | ICD-10-CM | POA: Diagnosis present

## 2016-04-02 DIAGNOSIS — N170 Acute kidney failure with tubular necrosis: Secondary | ICD-10-CM

## 2016-04-02 DIAGNOSIS — I4891 Unspecified atrial fibrillation: Secondary | ICD-10-CM | POA: Diagnosis not present

## 2016-04-02 DIAGNOSIS — I429 Cardiomyopathy, unspecified: Secondary | ICD-10-CM | POA: Diagnosis present

## 2016-04-02 DIAGNOSIS — I2510 Atherosclerotic heart disease of native coronary artery without angina pectoris: Secondary | ICD-10-CM | POA: Diagnosis present

## 2016-04-02 DIAGNOSIS — I480 Paroxysmal atrial fibrillation: Secondary | ICD-10-CM | POA: Diagnosis present

## 2016-04-02 DIAGNOSIS — I5021 Acute systolic (congestive) heart failure: Secondary | ICD-10-CM | POA: Diagnosis present

## 2016-04-02 DIAGNOSIS — I5041 Acute combined systolic (congestive) and diastolic (congestive) heart failure: Secondary | ICD-10-CM | POA: Diagnosis not present

## 2016-04-02 DIAGNOSIS — I248 Other forms of acute ischemic heart disease: Secondary | ICD-10-CM

## 2016-04-02 DIAGNOSIS — N179 Acute kidney failure, unspecified: Secondary | ICD-10-CM | POA: Diagnosis present

## 2016-04-02 DIAGNOSIS — Z87891 Personal history of nicotine dependence: Secondary | ICD-10-CM

## 2016-04-02 LAB — APTT: aPTT: 31 seconds (ref 24–36)

## 2016-04-02 LAB — BASIC METABOLIC PANEL
Anion gap: 7 (ref 5–15)
BUN: 51 mg/dL — ABNORMAL HIGH (ref 6–20)
CO2: 31 mmol/L (ref 22–32)
Calcium: 8.4 mg/dL — ABNORMAL LOW (ref 8.9–10.3)
Chloride: 95 mmol/L — ABNORMAL LOW (ref 101–111)
Creatinine, Ser: 2 mg/dL — ABNORMAL HIGH (ref 0.61–1.24)
GFR calc Af Amer: 40 mL/min — ABNORMAL LOW (ref >60)
GFR calc non Af Amer: 35 mL/min — ABNORMAL LOW (ref >60)
Glucose, Bld: 104 mg/dL — ABNORMAL HIGH (ref 65–99)
Potassium: 3.2 mmol/L — ABNORMAL LOW (ref 3.5–5.1)
Sodium: 133 mmol/L — ABNORMAL LOW (ref 135–145)

## 2016-04-02 LAB — CBC
HCT: 41.8 % (ref 40.0–52.0)
Hemoglobin: 14 g/dL (ref 13.0–18.0)
MCH: 32.6 pg (ref 26.0–34.0)
MCHC: 33.4 g/dL (ref 32.0–36.0)
MCV: 97.7 fL (ref 80.0–100.0)
Platelets: 181 10*3/uL (ref 150–440)
RBC: 4.28 MIL/uL — ABNORMAL LOW (ref 4.40–5.90)
RDW: 15.3 % — ABNORMAL HIGH (ref 11.5–14.5)
WBC: 13.5 10*3/uL — ABNORMAL HIGH (ref 3.8–10.6)

## 2016-04-02 LAB — ANTINUCLEAR ANTIBODIES, IFA: ANA Ab, IFA: NEGATIVE

## 2016-04-02 LAB — HEPARIN LEVEL (UNFRACTIONATED)
Heparin Unfractionated: <0.1 IU/mL — ABNORMAL LOW (ref 0.30–0.70)
Heparin Unfractionated: 0.36 IU/mL (ref 0.30–0.70)

## 2016-04-02 LAB — HEPATIC FUNCTION PANEL
ALT: 4627 U/L — ABNORMAL HIGH (ref 17–63)
AST: 5483 U/L — ABNORMAL HIGH (ref 15–41)
Albumin: 3.3 g/dL — ABNORMAL LOW (ref 3.5–5.0)
Alkaline Phosphatase: 101 U/L (ref 38–126)
Bilirubin, Direct: 1.3 mg/dL — ABNORMAL HIGH (ref 0.1–0.5)
Indirect Bilirubin: 1.9 mg/dL — ABNORMAL HIGH (ref 0.3–0.9)
Total Bilirubin: 3.2 mg/dL — ABNORMAL HIGH (ref 0.3–1.2)
Total Protein: 6.1 g/dL — ABNORMAL LOW (ref 6.5–8.1)

## 2016-04-02 LAB — MRSA PCR SCREENING: MRSA by PCR: NEGATIVE

## 2016-04-02 LAB — ANTI-SMOOTH MUSCLE ANTIBODY, IGG: F-Actin IgG: 54 Units — ABNORMAL HIGH (ref 0–19)

## 2016-04-02 LAB — ALPHA-1 ANTITRYPSIN PHENOTYPE: A-1 Antitrypsin, Ser: 175 mg/dL (ref 90–200)

## 2016-04-02 LAB — MITOCHONDRIAL ANTIBODIES: Mitochondrial M2 Ab, IgG: 8.7 Units (ref 0.0–20.0)

## 2016-04-02 LAB — PROTIME-INR
INR: 2.68
Prothrombin Time: 28.1 seconds — ABNORMAL HIGH (ref 11.4–15.0)

## 2016-04-02 LAB — MAGNESIUM: Magnesium: 2.5 mg/dL — ABNORMAL HIGH (ref 1.7–2.4)

## 2016-04-02 MED ORDER — SODIUM CHLORIDE 0.9 % IV SOLN: 250.0000 mL | INTRAVENOUS | Status: DC | PRN

## 2016-04-02 MED ORDER — LACTULOSE 10 GM/15ML PO SOLN
10.0000 g | Freq: Two times a day (BID) | ORAL | Status: DC
  Administered 2016-04-02 – 2016-04-06 (×6): 10 g via ORAL
  Filled 2016-04-02 (×7): qty 15

## 2016-04-02 MED ORDER — FUROSEMIDE 10 MG/ML IJ SOLN: 40.0000 mg | Freq: Two times a day (BID) | INTRAMUSCULAR | Status: DC

## 2016-04-02 MED ORDER — AMIODARONE HCL IN DEXTROSE 360-4.14 MG/200ML-% IV SOLN
30.0000 mg/h | INTRAVENOUS | Status: DC
  Administered 2016-04-02: 30 mg/h via INTRAVENOUS
  Filled 2016-04-02: qty 200

## 2016-04-02 MED ORDER — ASPIRIN EC 81 MG PO TBEC
81.0000 mg | DELAYED_RELEASE_TABLET | Freq: Every day | ORAL | Status: DC
  Administered 2016-04-03 – 2016-04-08 (×5): 81 mg via ORAL
  Filled 2016-04-02 (×6): qty 1

## 2016-04-02 MED ORDER — VITAMIN B-1 100 MG PO TABS
100.0000 mg | ORAL_TABLET | Freq: Every day | ORAL | Status: DC
  Administered 2016-04-03 – 2016-04-08 (×6): 100 mg via ORAL
  Filled 2016-04-02 (×6): qty 1

## 2016-04-02 MED ORDER — SODIUM CHLORIDE 0.9% FLUSH: 3.0000 mL | INTRAVENOUS | Status: DC | PRN

## 2016-04-02 MED ORDER — DOBUTAMINE IN D5W 4-5 MG/ML-% IV SOLN: 2.5000 ug/kg/min | INTRAVENOUS | Status: DC

## 2016-04-02 MED ORDER — AMIODARONE HCL IN DEXTROSE 360-4.14 MG/200ML-% IV SOLN
30.0000 mg/h | INTRAVENOUS | Status: DC
  Administered 2016-04-03 – 2016-04-05 (×5): 30 mg/h via INTRAVENOUS
  Filled 2016-04-02 (×6): qty 200

## 2016-04-02 MED ORDER — HEPARIN (PORCINE) IN NACL 100-0.45 UNIT/ML-% IJ SOLN
1600.0000 [IU]/h | INTRAMUSCULAR | Status: DC
  Administered 2016-04-02: 1300 [IU]/h via INTRAVENOUS
  Administered 2016-04-02: 1600 [IU]/h via INTRAVENOUS
  Filled 2016-04-02 (×2): qty 250

## 2016-04-02 MED ORDER — FOLIC ACID 1 MG PO TABS
1.0000 mg | ORAL_TABLET | Freq: Every day | ORAL | Status: DC
  Administered 2016-04-03 – 2016-04-08 (×6): 1 mg via ORAL
  Filled 2016-04-02 (×6): qty 1

## 2016-04-02 MED ORDER — ALPRAZOLAM 0.5 MG PO TABS
1.0000 mg | ORAL_TABLET | Freq: Three times a day (TID) | ORAL | Status: DC
  Administered 2016-04-03 – 2016-04-08 (×16): 1 mg via ORAL
  Filled 2016-04-02 (×16): qty 2

## 2016-04-02 MED ORDER — POTASSIUM CHLORIDE CRYS ER 20 MEQ PO TBCR
40.0000 meq | EXTENDED_RELEASE_TABLET | Freq: Once | ORAL | Status: AC
  Administered 2016-04-02: 40 meq via ORAL
  Filled 2016-04-02: qty 2

## 2016-04-02 MED ORDER — HEPARIN BOLUS VIA INFUSION: 5000.0000 [IU] | Freq: Once | INTRAVENOUS | Status: DC

## 2016-04-02 MED ORDER — ONDANSETRON HCL 4 MG/2ML IJ SOLN: 4.0000 mg | Freq: Four times a day (QID) | INTRAMUSCULAR | Status: DC | PRN

## 2016-04-02 MED ORDER — AMIODARONE LOAD VIA INFUSION
150.0000 mg | Freq: Once | INTRAVENOUS | Status: AC
  Administered 2016-04-02: 150 mg via INTRAVENOUS
  Filled 2016-04-02: qty 83.34

## 2016-04-02 MED ORDER — DOPAMINE-DEXTROSE 3.2-5 MG/ML-% IV SOLN: 0.0000 ug/kg/min | INTRAVENOUS | Status: DC

## 2016-04-02 MED ORDER — TIOTROPIUM BROMIDE MONOHYDRATE 18 MCG IN CAPS
18.0000 ug | ORAL_CAPSULE | Freq: Every day | RESPIRATORY_TRACT | Status: DC
  Administered 2016-04-06 – 2016-04-08 (×2): 18 ug via RESPIRATORY_TRACT
  Filled 2016-04-02 (×4): qty 5

## 2016-04-02 MED ORDER — FUROSEMIDE 10 MG/ML IJ SOLN
40.0000 mg | Freq: Two times a day (BID) | INTRAMUSCULAR | Status: DC
  Administered 2016-04-02: 40 mg via INTRAVENOUS
  Filled 2016-04-02: qty 4

## 2016-04-02 MED ORDER — HEPARIN (PORCINE) IN NACL 100-0.45 UNIT/ML-% IJ SOLN
1600.0000 [IU]/h | INTRAMUSCULAR | Status: DC
  Administered 2016-04-02 – 2016-04-04 (×4): 1600 [IU]/h via INTRAVENOUS
  Filled 2016-04-02 (×3): qty 250

## 2016-04-02 MED ORDER — AMIODARONE HCL IN DEXTROSE 360-4.14 MG/200ML-% IV SOLN: 60.0000 mg/h | INTRAVENOUS | Status: DC

## 2016-04-02 MED ORDER — SODIUM CHLORIDE 0.9% FLUSH
3.0000 mL | Freq: Two times a day (BID) | INTRAVENOUS | Status: DC
  Administered 2016-04-02 – 2016-04-07 (×8): 3 mL via INTRAVENOUS

## 2016-04-02 MED ORDER — LACTULOSE 10 GM/15ML PO SOLN
10.0000 g | Freq: Two times a day (BID) | ORAL | Status: DC
  Administered 2016-04-02: 10 g via ORAL
  Filled 2016-04-02: qty 30

## 2016-04-02 MED ORDER — ACETAMINOPHEN 325 MG PO TABS: 650.0000 mg | ORAL_TABLET | ORAL | Status: DC | PRN

## 2016-04-02 MED ORDER — AMIODARONE HCL IN DEXTROSE 360-4.14 MG/200ML-% IV SOLN
60.0000 mg/h | INTRAVENOUS | Status: AC
Start: 2016-04-02 — End: 2016-04-02
  Administered 2016-04-02 (×2): 60 mg/h via INTRAVENOUS
  Filled 2016-04-02 (×2): qty 200

## 2016-04-02 NOTE — Progress Notes (Signed)
Pt. HR 120's - 140's in A-fib and sustaining, called Dr. Kirke Corin discussed pt. Labs, VS and medications- gtt rates. MD ordered lopressor PRN and heparin gtt. (see EMR for details)  Spoke with pharmacy and drew PT and APTT labs so Rx could appropriately dose med. Will continue to monitor pt. Closely.

## 2016-04-02 NOTE — Consult Note (Signed)
[facility] Pulmonary Medicine Consultation      Name: Caleb Barnes MRN: [id number] DOB: [birth year]    ADMISSION DATE:  03/31/2016 CONSULTATION DATE:  04/01/16  REFERRING MD : Dr. Charlann Lange   CHIEF COMPLAINT:   Acute SOB-slowly improving   HISTORY OF PRESENT ILLNESS  Remains on dopamine and dobutamine Alert and awake, nad    Review of Systems  Constitutional: Negative for fever, chills, weight loss, malaise/fatigue and diaphoresis.  HENT: Negative for congestion and hearing loss.   Eyes: Negative for blurred vision and double vision.  Respiratory: Positive for shortness of breath. Negative for cough, hemoptysis, sputum production and wheezing.   Cardiovascular: Positive for leg swelling. Negative for chest pain, palpitations and orthopnea.  Gastrointestinal: Negative for heartburn, nausea, vomiting, abdominal pain, diarrhea, constipation and blood in stool.  Genitourinary: Negative for dysuria and urgency.  Musculoskeletal: Negative for myalgias, back pain and neck pain.  Skin: Negative for rash.  Neurological: Negative for dizziness, tingling, tremors, weakness and headaches.  Endo/Heme/Allergies: Does not bruise/bleed easily.  Psychiatric/Behavioral: Negative for depression, suicidal ideas and substance abuse.  All other systems reviewed and are negative.     VITAL SIGNS    Temp:  [97.8 F (36.6 C)-98.6 F (37 C)] 97.8 F (36.6 C) (04/05 1005) Pulse Rate:  [37-108] 91 (04/05 0900) Resp:  [15-29] 18 (04/05 1000) BP: (81-149)/(61-89) 94/71 mmHg (04/05 1000) SpO2:  [78 %-100 %] 92 % (04/05 0900) HEMODYNAMICS:   VENTILATOR SETTINGS:   INTAKE / OUTPUT:  Intake/Output Summary (Last 24 hours) at 04/02/16 1100 Last data filed at 04/02/16 1007  Gross per 24 hour  Intake 946.16 ml  Output   5295 ml  Net -4348.84 ml       PHYSICAL EXAM   Physical Exam  Constitutional: He is oriented to person, place, and time. He appears well-developed and well-nourished. No  distress.  HENT:  Head: Normocephalic and atraumatic.  Mouth/Throat: No oropharyngeal exudate.  Eyes: EOM are normal. Pupils are equal, round, and reactive to light. No scleral icterus.  Neck: Normal range of motion. Neck supple.  Cardiovascular: Normal rate, regular rhythm and normal heart sounds.   No murmur heard. Pulmonary/Chest: No stridor. No respiratory distress. He has no wheezes. He has rales.  Abdominal: Soft. Bowel sounds are normal. He exhibits no distension. There is no tenderness. There is no rebound.  Musculoskeletal: Normal range of motion. He exhibits edema.  Neurological: He is alert and oriented to person, place, and time.  Skin: Skin is warm. He is not diaphoretic.  Psychiatric: He has a normal mood and affect.       LABS   LABS:  CBC  Recent Labs Lab 03/31/16 0658 04/01/16 0447 04/02/16 0446  WBC 11.4* 21.8* 13.5*  HGB 13.4 13.2 14.0  HCT 40.4 38.9* 41.8  PLT 276 211 181   Coag's  Recent Labs Lab 03/31/16 1245 04/02/16 0008  APTT  --  31  INR 1.84 2.68   BMET  Recent Labs Lab 03/31/16 0658 04/01/16 0447 04/02/16 0446  NA 132* 127* 133*  K 3.5 4.4 3.2*  CL 99* 93* 95*  CO2 23 22 31   BUN 19 39* 51*  CREATININE 1.12 2.20* 2.00*  GLUCOSE 146* 147* 104*   Electrolytes  Recent Labs Lab 03/31/16 0658 03/31/16 1438 04/01/16 0447 04/02/16 0446  CALCIUM 8.9  --  8.5* 8.4*  MG <0.1* 2.9* 2.7* 2.5*  PHOS  --   --  5.8*  --    Sepsis Markers No results  for input(s): LATICACIDVEN, PROCALCITON, O2SATVEN in the last 168 hours. ABG No results for input(s): PHART, PCO2ART, PO2ART in the last 168 hours. Liver Enzymes  Recent Labs Lab 03/31/16 0658 04/01/16 0447 04/02/16 0446  AST 785* >2275* 5483*  ALT 828* 5950* 4627*  ALKPHOS 81 76 101  BILITOT 2.9* 3.4* 3.2*  ALBUMIN 4.1 3.5 3.3*   Cardiac Enzymes  Recent Labs Lab 03/31/16 0658 03/31/16 1245 03/31/16 1739  TROPONINI 0.17* 0.20* 0.19*   Glucose  Recent Labs Lab  03/31/16 1255  GLUCAP 152*     Recent Results (from the past 240 hour(s))  MRSA PCR Screening     Status: None   Collection Time: 03/31/16 12:54 PM  Result Value Ref Range Status   MRSA by PCR NEGATIVE NEGATIVE Final    Comment:        The GeneXpert MRSA Assay (FDA approved for NASAL specimens only), is one component of a comprehensive MRSA colonization surveillance program. It is not intended to diagnose MRSA infection nor to guide or monitor treatment for MRSA infections.      Current facility-administered medications:  .  ALPRAZolam (XANAX) tablet 1 mg, 1 mg, Oral, TID, Erin Fulling, MD, 1 mg at 04/02/16 1014 .  [COMPLETED] amiodarone (NEXTERONE) 1.8 mg/mL load via infusion 150 mg, 150 mg, Intravenous, Once, Stopped at 04/02/16 0244 **FOLLOWED BY** [EXPIRED] amiodarone (NEXTERONE PREMIX) 360 MG/200ML (1.8 mg/mL) IV infusion, 60 mg/hr, Intravenous, Continuous, Stopped at 04/02/16 1005 **FOLLOWED BY** amiodarone (NEXTERONE PREMIX) 360 MG/200ML (1.8 mg/mL) IV infusion, 30 mg/hr, Intravenous, Continuous, Iran Ouch, MD, Last Rate: 16.7 mL/hr at 04/02/16 1006, 30 mg/hr at 04/02/16 1006 .  aspirin chewable tablet 81 mg, 81 mg, Oral, Daily, Altamese Dilling, MD, 81 mg at 04/02/16 1014 .  DOBUTamine (DOBUTREX) infusion 4000 mcg/mL, 2.5-10 mcg/kg/min, Intravenous, Titrated, Antonieta Iba, MD, Last Rate: 2.9 mL/hr at 04/02/16 0600, 2.531 mcg/kg/min at 04/02/16 0600 .  DOPamine (INTROPIN) 800 mg in dextrose 5 % 250 mL (3.2 mg/mL) infusion, 0-5 mcg/kg/min, Intravenous, Titrated, Antonieta Iba, MD, Last Rate: 2.9 mL/hr at 04/02/16 0600, 2.024 mcg/kg/min at 04/02/16 0600 .  folic acid (FOLVITE) tablet 1 mg, 1 mg, Oral, Daily, Erin Fulling, MD, 1 mg at 04/02/16 1014 .  furosemide (LASIX) injection 40 mg, 40 mg, Intravenous, Q12H, Iran Ouch, MD, 40 mg at 04/02/16 1022 .  heparin ADULT infusion 100 units/mL (25000 units/250 mL), 1,600 Units/hr, Intravenous, Continuous,  Altamese Dilling, MD, Last Rate: 16 mL/hr at 04/02/16 1007, 1,600 Units/hr at 04/02/16 1007 .  ipratropium-albuterol (DUONEB) 0.5-2.5 (3) MG/3ML nebulizer solution 3 mL, 3 mL, Nebulization, Q6H PRN, Altamese Dilling, MD .  lactulose (CHRONULAC) 10 GM/15ML solution 10 g, 10 g, Oral, BID, Elnita Maxwell, MD .  LORazepam (ATIVAN) tablet 1 mg, 1 mg, Oral, Q6H PRN **OR** LORazepam (ATIVAN) injection 1 mg, 1 mg, Intravenous, Q6H PRN, Erin Fulling, MD .  LORazepam (ATIVAN) tablet 0-4 mg, 0-4 mg, Oral, Q6H, 2 mg at 04/02/16 0020 **FOLLOWED BY** [START ON 04/03/2016] LORazepam (ATIVAN) tablet 0-4 mg, 0-4 mg, Oral, Q12H, Erin Fulling, MD .  metoprolol (LOPRESSOR) injection 2.5 mg, 2.5 mg, Intravenous, Q6H PRN, Iran Ouch, MD, 2.5 mg at 04/02/16 0011 .  multivitamin with minerals tablet 1 tablet, 1 tablet, Oral, Daily, Erin Fulling, MD, 1 tablet at 04/02/16 1014 .  sodium chloride flush (NS) 0.9 % injection 10-40 mL, 10-40 mL, Intracatheter, PRN, Altamese Dilling, MD .  thiamine (VITAMIN B-1) tablet 100 mg, 100 mg, Oral, Daily, 100 mg  at 04/02/16 1019 **OR** thiamine (B-1) injection 100 mg, 100 mg, Intravenous, Daily, Erin Fulling, MD .  tiotropium (SPIRIVA) inhalation capsule 18 mcg, 18 mcg, Inhalation, Daily, Altamese Dilling, MD, 18 mcg at 04/02/16 1007  IMAGING    [place] Art/ven Flow Abd Pelv Doppler Limited  04/01/2016  CLINICAL DATA:  Elevated LFTs. EXAM: DUPLEX ULTRASOUND OF LIVER TECHNIQUE: Color and duplex Doppler ultrasound was performed to evaluate the hepatic in-flow and out-flow vessels. COMPARISON:  Ultrasound and CT from the previous day FINDINGS: Portal Vein 1.3 cm diameter. No evidence of occlusion or thrombus. Velocities (all hepatopetal): Main:  8-17 cm/sec Right:  16 cm/sec Left:  10 cm/sec Hepatic Vein Velocities (all hepatofugal): Right:  53 cm/sec Middle:  24 cm/sec Left:  19 cm/sec Intrahepatic IVC is patent, velocity 33 cm/second. Hepatic Artery Velocity:  61 cm/sec  Spleen 9.3 x 10 x 3.7 cm. Splenic Vein shows no evidence of occlusion or thrombus. Velocity: 13 cm/sec Varices: None seen Ascites: Trace perisplenic and perihepatic IMPRESSION: 1. Normal liver vascular assessment. 2. Trace abdominal ascites. Electronically Signed   By: Corlis Leak M.D.   On: 04/01/2016 12:19      Reason to continue [employer] Monitoring of central venous pressure or other hemodynamic parameters    ECHO verbal report by cardiology is EF 10% INDWELLING DEVICES::LEFT IJ CVL>>>4/4  MICRO DATA: MRSA PCR NEGATIVE     ASSESSMENT/PLAN  60 yo white male with chronic ETOH and tobacco abuse with acute CHF with acute cardiorenal syndrome with acute liver dysfunction Patient with multi-organ dysfunction. centrel line placed for IV dobutamine and dopamine  After discussion with cardiology, patient to be assessed for transfer to [facility] for further management  PULMONARY Oxygen as needed  CARDIOVASCULAR Start dopamine and dobutamine per cardiology  RENAL arf from ATN -follow chem 7 follow UO  GASTROINTESTINAL Diet as tolerated  HEMATOLOGIC Follow CBC Transfusion guidelines       I have personally obtained a history, examined the patient, evaluated laboratory and independently reviewed  imaging results, formulated the assessment and plan and placed orders.  The Patient requires high complexity decision making for assessment and support, frequent evaluation and titration of therapies, application of advanced monitoring technologies and extensive interpretation of multiple databases. Overall,  prognosis is guarded.  Lucie Leather, M.D.  [facility]  Medical Director [facility] Medical Director [facility] Cardio-Pulmonary Department

## 2016-04-02 NOTE — Progress Notes (Signed)
Patient: Caleb Barnes / Admit Date: 03/31/2016 / Date of Encounter: 04/02/2016, 8:59 AM   Subjective: Back into Afib/flutter overnight requiring amiodarone infusion to be restarted. No win sinus rhythm. Has diuresed 4.1 L for the past 24 hours since starting dopamine and dobutamine infusions. Renal function improved today form 2.20-->2.00. LFTs remain elevated with AST >2275-->5483 and ALT 5950-->4627. T bili 3.4-->3.2.   Review of Systems: Review of Systems  Constitutional: Positive for weight loss and malaise/fatigue. Negative for fever, chills and diaphoresis.  HENT: Negative for congestion.   Eyes: Negative for discharge and redness.  Respiratory: Positive for shortness of breath. Negative for cough, hemoptysis, sputum production and wheezing.   Cardiovascular: Negative for chest pain, palpitations, orthopnea, claudication, leg swelling and PND.  Gastrointestinal: Positive for nausea. Negative for heartburn, vomiting and abdominal pain.  Musculoskeletal: Negative for falls.  Skin: Negative for rash.  Neurological: Positive for weakness. Negative for dizziness, sensory change, speech change, focal weakness and loss of consciousness.  Endo/Heme/Allergies: Does not bruise/bleed easily.  Psychiatric/Behavioral: The patient is not nervous/anxious.      Objective: Telemetry: Currently sinus 90's, overnight Afib with RVR 130's to 140's Physical Exam: Blood pressure 104/73, pulse 88, temperature 98.6 F (37 C), temperature source Axillary, resp. rate 17, height  (1.803 m), weight 168 lb 6.9 oz (76.4 kg), SpO2 97 %. Body mass index is 23.5 kg/(m^2). General: Ill appearing, in no acute distress. Head: Normocephalic, atraumatic, sclera non-icteric, no xanthomas, nares are without discharge. Neck: Negative for carotid bruits. JVP not elevated. Lungs: Clear bilaterally to auscultation without wheezes, rales, or rhonchi. Breathing is unlabored. Heart: RRR S1 S2 without murmurs, rubs, or  gallops.  Abdomen: Soft, non-tender, non-distended with normoactive bowel sounds. No rebound/guarding. Extremities: No clubbing or cyanosis. No edema. Distal pedal pulses are 2+ and equal bilaterally. Neuro: Alert and oriented X 3. Moves all extremities spontaneously. Psych:  Responds to questions appropriately with a normal affect.   Intake/Output Summary (Last 24 hours) at 04/02/16 0859 Last data filed at 04/02/16 0600  Gross per 24 hour  Intake 996.26 ml  Output   5120 ml  Net -4123.74 ml    Inpatient Medications:  . ALPRAZolam  1 mg Oral TID  . antiseptic oral rinse  7 mL Mouth Rinse BID  . aspirin  81 mg Oral Daily  . folic acid  1 mg Oral Daily  . furosemide  40 mg Intravenous Q12H  . lactulose  10 g Oral BID  . LORazepam  0-4 mg Oral Q6H   Followed by  . [START ON 04/03/2016] LORazepam  0-4 mg Oral Q12H  . multivitamin with minerals  1 tablet Oral Daily  . potassium chloride  40 mEq Oral Once  . thiamine  100 mg Oral Daily   Or  . thiamine  100 mg Intravenous Daily  . tiotropium  18 mcg Inhalation Daily   Infusions:  . amiodarone    . DOBUTamine 2.531 mcg/kg/min (04/02/16 0600)  . DOPamine 2.024 mcg/kg/min (04/02/16 0600)  . heparin 1,300 Units/hr (04/02/16 0600)    Labs:  Recent Labs  04/01/16 0447 04/02/16 0446  NA 127* 133*  K 4.4 3.2*  CL 93* 95*  CO2 22 31  GLUCOSE 147* 104*  BUN 39* 51*  CREATININE 2.20* 2.00*  CALCIUM 8.5* 8.4*  MG 2.7* 2.5*  PHOS 5.8*  --     Recent Labs  04/01/16 0447 04/02/16 0446  AST >2275* 5483*  ALT 5950* 4627*  ALKPHOS 76 101  BILITOT 3.4* 3.2*  PROT 6.0* 6.1*  ALBUMIN 3.5 3.3*    Recent Labs  04/01/16 0447 04/02/16 0446  WBC 21.8* 13.5*  HGB 13.2 14.0  HCT 38.9* 41.8  MCV 98.1 97.7  PLT 211 181    Recent Labs  03/31/16 0658 03/31/16 1245 03/31/16 1739  TROPONINI 0.17* 0.20* 0.19*   Invalid input(s): POCBNP No results for input(s): HGBA1C in the last 72 hours.   Weights: Filed Weights    03/31/16 0649 03/31/16 1252  Weight: 170 lb (77.111 kg) 168 lb 6.9 oz (76.4 kg)     Radiology/Studies:  Ct Angio Chest Pe W/cm &/or Wo Cm  03/31/2016  CLINICAL DATA:  Difficulty sleeping, shortness of breath since Friday. EXAM: CT ANGIOGRAPHY CHEST WITH CONTRAST TECHNIQUE: Multidetector CT imaging of the chest was performed using the standard protocol during bolus administration of intravenous contrast. Multiplanar CT image reconstructions and MIPs were obtained to evaluate the vascular anatomy. CONTRAST:  100 cc Isovue 370 IV COMPARISON:  Chest x-ray performed today. FINDINGS: No filling defects in the pulmonary arteries to suggest pulmonary emboli. Small bilateral pleural effusions, right larger than left. Mild vascular congestion and central interstitial prominence could reflect early interstitial edema. Ground-glass opacities in the posterior lower lobes could reflect edema or atelectasis. Marked cardiomegaly. Aorta is normal caliber. Borderline size mediastinal and bilateral hilar lymph nodes may be related to congestion. Chest wall soft tissues are unremarkable. Imaging into the upper abdomen shows no acute findings. Review of the MIP images confirms the above findings. IMPRESSION: Marked cardiomegaly with vascular congestion and probable perihilar interstitial edema. Small bilateral effusions, right greater than left. No evidence of pulmonary embolus. Electronically Signed   By: Charlett Nose M.D.   On: 03/31/2016 10:34   [place] Art/ven Flow Abd Pelv Doppler Limited  04/01/2016  CLINICAL DATA:  Elevated LFTs. EXAM: DUPLEX ULTRASOUND OF LIVER TECHNIQUE: Color and duplex Doppler ultrasound was performed to evaluate the hepatic in-flow and out-flow vessels. COMPARISON:  Ultrasound and CT from the previous day FINDINGS: Portal Vein 1.3 cm diameter. No evidence of occlusion or thrombus. Velocities (all hepatopetal): Main:  8-17 cm/sec Right:  16 cm/sec Left:  10 cm/sec Hepatic Vein Velocities (all hepatofugal):  Right:  53 cm/sec Middle:  24 cm/sec Left:  19 cm/sec Intrahepatic IVC is patent, velocity 33 cm/second. Hepatic Artery Velocity:  61 cm/sec Spleen 9.3 x 10 x 3.7 cm. Splenic Vein shows no evidence of occlusion or thrombus. Velocity: 13 cm/sec Varices: None seen Ascites: Trace perisplenic and perihepatic IMPRESSION: 1. Normal liver vascular assessment. 2. Trace abdominal ascites. Electronically Signed   By: Corlis Leak M.D.   On: 04/01/2016 12:19   Dg Chest Port 1 View  04/01/2016  CLINICAL DATA:  Evaluate central line placement EXAM: PORTABLE CHEST 1 VIEW COMPARISON:  03/31/2016 FINDINGS: Left Central line is in place with the tip at the cavoatrial junction. No pneumothorax. Cardiomegaly. Mild vascular congestion. No confluent airspace opacities, effusions or overt edema. No acute bony abnormality. IMPRESSION: Cardiomegaly, vascular congestion. Electronically Signed   By: Charlett Nose M.D.   On: 04/01/2016 10:39   Dg Chest Portable 1 View  03/31/2016  CLINICAL DATA:  Shortness of breath. EXAM: PORTABLE CHEST 1 VIEW COMPARISON:  No prior. FINDINGS: Cardiomegaly with pulmonary vascular prominence and bilateral interstitial prominence with small left pleural effusion. Findings consistent with congestive heart failure. IMPRESSION: Congestive heart failure with pulmonary interstitial edema and small left pleural effusion. Electronically Signed   By: Maisie Fus  Register   On:  03/31/2016 08:08   US Abdomen Limited Ruq  03/31/2016  CLINICAL DATA:  60 year old male with elevated LFTs. Patient currently with CHF. EXAM: US ABDOMEN LIMITED - RIGHT UPPER QUADRANT COMPARISON:  None. FINDINGS: Gallbladder: Gallbladder wall thickening is noted with small amount of pericholecystic fluid. There is no evidence of cholelithiasis or sonographic Murphy sign. Common bile duct: Diameter: 3.2 mm. There is no evidence of intrahepatic or extrahepatic biliary dilatation. Liver: No focal lesion identified. Within normal limits in parenchymal  echogenicity. A trace amount of ascites is noted. IMPRESSION: Gallbladder wall thickening without cholelithiasis or biliary dilatation. Gallbladder wall thickening is most likely related to hepatic dysfunction or congestive heart failure/ascites. Recommend nuclear medicine study if there is strong clinical suspicion for acute cholecystitis. Trace ascites. Unremarkable liver. Electronically Signed   By: Harmon Pier M.D.   On: 03/31/2016 14:11     Assessment and Plan   1. New onset atrial flutter with RVR: -Converted to NSR on amiodarone infusion, episode of Afib/flutter overnight 4/4, now back in NSR with HR in the 90's -On heaprin gtt given the above as well as his severe dilated cardiomyopathy,history of paroxysmal arrhythmia per the patient -Amiodarone infusion was held 4/4 given severe climb in LFTs, however patient redeveloped Afib/flutter with RVR overnight and amiodarone infusion was restarted, patient now back in sinus rhythm, continue for now given this is maintaining his current rhythm , soft BP preclude usage of BB at this time -High risk of recurrent arrhythmia  2. Acute systolic heart failure: -Possibly tachy-mediated, unable to exclude alcohol given his long history -Also will require ischemia workup given long smoking history though on CT scan of the chest there is minimal calcified atherosclerotic plaque -Minimal urine output through 4/4, worsening renal function on 4/4 concerning for cardiorenal syndrome, worsening LFTs concerning for hepatic congestion. Started on dopamine and dobutamine gtts for inotropic support on 4/4 -Continue inotropes at this time -Has diuresed 3.96 L for the admission to date and 4.1 L for the past 24 hours s/p starting dopamine and dobutamine   3. Elevated troponin: -Likely secondary to tachycardia , cardiomyopathy -CT scan of the chest with minimal calcified coronary plaquing, less likely ischemia -Currently with no plan for catheterization given  renal dysfunction, though will need ischemic work up as above  4. Abnormal LFTs/possible congestive hepatopathy: -RUQ ultrasound with GB thickening without cholelithiasis or biliary dilatation, felt to be 2/2 CHF -Inotropes as above to augment cardiac output for cardiorenal syndrome  5. ETOH abuse: -Patient reports prior history of alcohol abuse -Likely playing a role in the above as well given the degree of abuse   Signed, Eula Listen, PA-C Pager: [phone] 04/02/2016, 8:59 AM

## 2016-04-02 NOTE — Progress Notes (Signed)
ANTICOAGULATION CONSULT NOTE - Initial Consult  Pharmacy Consult for Heparin Indication: atrial fibrillation  No Known Allergies  Patient Measurements: Height: 5\' 11"  (180.3 cm) Weight: 156 lb 4.9 oz (70.9 kg) IBW/kg (Calculated) : 75.3 Heparin Dosing Weight: 70.9 kg  Vital Signs: Temp: 97.5 F (36.4 C) (04/05 1625) Temp Source: Oral (04/05 1625) BP: 113/85 mmHg (04/05 1700) Pulse Rate: 94 (04/05 1700)  Labs:  Recent Labs  03/31/16 0658 03/31/16 1245 03/31/16 1739 04/01/16 0447 04/02/16 0008 04/02/16 0446 04/02/16 0625 04/02/16 1450  HGB 13.4  --   --  13.2  --  14.0  --   --   HCT 40.4  --   --  38.9*  --  41.8  --   --   PLT 276  --   --  211  --  181  --   --   APTT  --   --   --   --  31  --   --   --   LABPROT  --  21.2*  --   --  28.1*  --   --   --   INR  --  1.84  --   --  2.68  --   --   --   HEPARINUNFRC  --   --   --   --   --   --  <0.10* 0.36  CREATININE 1.12  --   --  2.20*  --  2.00*  --   --   TROPONINI 0.17* 0.20* 0.19*  --   --   --   --   --     Estimated Creatinine Clearance: 39.9 mL/min (by C-G formula based on Cr of 2).   Medical History: Past Medical History  Diagnosis Date  . COPD (chronic obstructive pulmonary disease) (HCC)   . CHF (congestive heart failure) (HCC)   . Polysubstance abuse   . Shortness of breath dyspnea   . Anxiety   . Acute renal failure [facility])     Assessment: 60 yo presents on 4/3 with SOB. Found to be in Afib with RVR.  Heparin gtt started at [place] this morning but was not continued on transfer. HL was therapeutic early this afternoon on a rate of 1,600 units/hr. Now reordered to start for Afib. INR falsely elevated most likely due to hx of EtOH abuse and liver damage. No anticoag PTA.  Goal of Therapy:  Heparin level 0.3-0.7 units/ml Monitor platelets by anticoagulation protocol: Yes   Plan:  Restart heparin gtt at 1,600 units/hr Check 6 hr HL Monitor daily HL, CBC, s/s of bleed   Enzo Bi,  PharmD, [facility] Clinical Pharmacist Pager [phone] 04/02/2016 6:44 PM

## 2016-04-02 NOTE — Progress Notes (Signed)
ANTICOAGULATION CONSULT NOTE - Initial Consult  Pharmacy Consult for heparin drip Indication: atrial fibrillation  No Known Allergies  Patient Measurements: Height: 5\' 11"  (180.3 cm) Weight: 168 lb 6.9 oz (76.4 kg) IBW/kg (Calculated) : 75.3 Heparin Dosing Weight: 76kg  Vital Signs: Temp: 97.7 F (36.5 C) (04/05 1445) Temp Source: Oral (04/05 1445) BP: 113/79 mmHg (04/05 1600) Pulse Rate: 94 (04/05 1619)  Labs:  Recent Labs  03/31/16 0658 03/31/16 1245 03/31/16 1739 04/01/16 0447 04/02/16 0008 04/02/16 0446 04/02/16 0625 04/02/16 1450  HGB 13.4  --   --  13.2  --  14.0  --   --   HCT 40.4  --   --  38.9*  --  41.8  --   --   PLT 276  --   --  211  --  181  --   --   APTT  --   --   --   --  31  --   --   --   LABPROT  --  21.2*  --   --  28.1*  --   --   --   INR  --  1.84  --   --  2.68  --   --   --   HEPARINUNFRC  --   --   --   --   --   --  <0.10* 0.36  CREATININE 1.12  --   --  2.20*  --  2.00*  --   --   TROPONINI 0.17* 0.20* 0.19*  --   --   --   --   --     Estimated Creatinine Clearance: 42.4 mL/min (by C-G formula based on Cr of 2).   Medical History: Past Medical History  Diagnosis Date  . COPD (chronic obstructive pulmonary disease) (HCC)   . CHF (congestive heart failure) (HCC)   . Polysubstance abuse   . Shortness of breath dyspnea   . Anxiety   . Acute renal failure (HCC)     Medications:    Assessment: INR 2.68. Does not appear to have been on anticoagulation.  Goal of Therapy:  Heparin level 0.3-0.7 units/ml Monitor platelets by anticoagulation protocol: Yes   Plan:  Heparin drip increased to 1600 units/hr due to subtherapeutic HL which is now therapeutic. Will recheck a HL in 6 hours.   Luisa Hart D 04/02/2016,4:24 PM

## 2016-04-02 NOTE — Progress Notes (Signed)
    Please see pended admission orders for [facility] admission.

## 2016-04-02 NOTE — Discharge Summary (Signed)
[facility] - [place] at [facility]   PATIENT NAME: Caleb Barnes    MR#:  [id number]  DATE OF BIRTH:  04-12-1956  DATE OF ADMISSION:  03/31/2016 ADMITTING PHYSICIAN: Altamese Dilling, MD  DATE OF DISCHARGE: 04/02/2016  PRIMARY CARE PHYSICIAN: [facility]    ADMISSION DIAGNOSIS:  Acute pulmonary edema (HCC) [J81.0] SOB (shortness of breath) [R06.02] Elevated troponin [R79.89] Elevated LFTs [R79.89] Acute congestive heart failure, unspecified congestive heart failure type (HCC) [I50.9] Atrial flutter, unspecified type (HCC) [I48.92]  DISCHARGE DIAGNOSIS:  Principal Problem:   Acute combined systolic and diastolic heart failure (HCC) Active Problems:   Atrial fibrillation with rapid ventricular response (HCC)   Acute pulmonary edema (HCC)   Atrial flutter (HCC)   Elevated LFTs   Demand ischemia of myocardium (HCC)   SOB (shortness of breath)   Acute renal failure (HCC)   SECONDARY DIAGNOSIS:   Past Medical History  Diagnosis Date  . COPD (chronic obstructive pulmonary disease) (HCC)   . CHF (congestive heart failure) (HCC)   . Polysubstance abuse   . Shortness of breath dyspnea   . Anxiety   . Acute renal failure [facility])     HOSPITAL COURSE:   * A fib with RVR required IV cardizem drip initially , then HR under control- again converted to a fib so re-started on amio drip.  Echo- EF 25%.  Cardio to decide anticoagulation.  Mg is low- given IV replacement.  normal TSH.   Cardio started on heparin drip.  * Shock liver   Due to CHF  Cardio decided to give dopamin and dobutamine drip.  Monitor LFT.  GI consult appreciated, other tests also sent by GI.   LFTs are still same elevated, Transfer to [facility].  * hypomagnesemia\  Replaced IV. Normal  * Ac systolic CHF  IV lasix.   Had good diuresis with Vasopressors.  * Elevated D dimer  Checked CT angio- negative for PE.  * COPD  Will give  Duoneb.  No exacerbation at this time.  Pulmonary now on case.  * ARF  Due to decreased perfusion  On vasopressor per cardio.   Creatinin 1.12 on admission to 2.2- and now 2.0 after one day of Dobutamin.   DISCHARGE CONDITIONS:   Stable. CONSULTS OBTAINED:  Treatment Team:  Iran Ouch, MD Elnita Maxwell, MD Erin Fulling, MD Mosetta Pigeon, MD  DRUG ALLERGIES:  No Known Allergies  DISCHARGE MEDICATIONS:   Current Discharge Medication List    START taking these medications   Details  amiodarone (NEXTERONE PREMIX) 360 MG/200ML SOLN Inject 33.33 mL/hr into the vein continuous. Qty: 250 mL, Refills: 0    DOBUTamine (DOBUTREX) 4-5 MG/ML-% infusion Inject 191-764 mcg/min into the vein continuous. Qty: 250 mL, Refills: 0    DOPamine (INTROPIN) 3.2-5 MG/ML-% infusion Inject 0-0.382 mg/min into the vein continuous. Qty: 250 mL, Refills: 0    furosemide (LASIX) 10 MG/ML injection Inject 4 mLs (40 mg total) into the vein every 12 (twelve) hours. Qty: 4 mL, Refills: 0      CONTINUE these medications which have NOT CHANGED   Details  albuterol (PROVENTIL HFA;VENTOLIN HFA) 108 (90 Base) MCG/ACT inhaler Inhale 1 puff into the lungs every 4 (four) hours as needed for wheezing or shortness of breath.    aspirin 81 MG chewable tablet Chew 81 mg by mouth daily.    tiotropium (SPIRIVA) 18 MCG inhalation capsule Place 18 mcg into inhaler and inhale daily.  STOP taking these medications     doxycycline (VIBRA-TABS) 100 MG tablet          DISCHARGE INSTRUCTIONS:    Follow as advised by cardiologist at [facility].  If you experience worsening of your admission symptoms, develop shortness of breath, life threatening emergency, suicidal or homicidal thoughts you must seek medical attention immediately by calling 911 or calling your MD immediately  if symptoms less severe.  You Must read complete instructions/literature along with all the possible adverse  reactions/side effects for all the Medicines you take and that have been prescribed to you. Take any new Medicines after you have completely understood and accept all the possible adverse reactions/side effects.   Please note  You were cared for by a hospitalist during your hospital stay. If you have any questions about your discharge medications or the care you received while you were in the hospital after you are discharged, you can call the unit and asked to speak with the hospitalist on call if the hospitalist that took care of you is not available. Once you are discharged, your primary care physician will handle any further medical issues. Please note that NO REFILLS for any discharge medications will be authorized once you are discharged, as it is imperative that you return to your primary care physician (or establish a relationship with a primary care physician if you do not have one) for your aftercare needs so that they can reassess your need for medications and monitor your lab values.    Today   CHIEF COMPLAINT:   Chief Complaint  Patient presents with  . Shortness of Breath    HISTORY OF PRESENT ILLNESS:  Caleb Barnes  is a 60 y.o. male with a known history of Chronic smoking, stopped smoking 3 years ago. He had complain of feeling shortness of breath especially at nighttime when he is to stay in the recliner with some swelling on his ankles. He went to his primary care doctor last month and she prescribed prednisone and azithromycin with oral Lasix. She told him he has COPD and possibly congestive heart failure. The patient felt better after finishing that course for 1 or 2 weeks but then again for last 1 week started having the same complaint. He has to stay up in the night mostly in the recliner. He denies any chest pain but has some palpitation episodes on and off. Came to emergency room today and he was noted to have atrial fibrillation with rapid ventricular response, so started on  amiodarone drip after discussing with cardiology and given to hospitalist team for further management.    VITAL SIGNS:  Blood pressure 94/71, pulse 91, temperature 97.8 F (36.6 C), temperature source Oral, resp. rate 18, height 5\' 11"  (1.803 m), weight 76.4 kg (168 lb 6.9 oz), SpO2 92 %.  I/O:   Intake/Output Summary (Last 24 hours) at 04/02/16 1209 Last data filed at 04/02/16 1007  Gross per 24 hour  Intake 945.15 ml  Output   5295 ml  Net -4349.85 ml    PHYSICAL EXAMINATION:   GENERAL: 59 y.o.-year-old patient lying in the bed with no acute distress.  EYES: Pupils equal, round, reactive to light and accommodation. No scleral icterus. Extraocular muscles intact.  HEENT: Head atraumatic, normocephalic. Oropharynx and nasopharynx clear.  NECK: Supple, no jugular venous distention. No thyroid enlargement, no tenderness.  LUNGS: Normal breath sounds bilaterally, no wheezing, Some crepitation. No use of accessory muscles of respiration.  CARDIOVASCULAR: S1, S2 regular, tachy, No  murmurs, rubs, or gallops.  ABDOMEN: Soft, nontender, nondistended. Bowel sounds present. No organomegaly or mass.  EXTREMITIES: No pedal edema, cyanosis, or clubbing.  NEUROLOGIC: Cranial nerves II through XII are intact. Muscle strength 5/5 in all extremities. Sensation intact. Gait not checked.  PSYCHIATRIC: The patient is alert and oriented x 3.  SKIN: No obvious rash, lesion, or ulcer.   DATA REVIEW:   CBC  Recent Labs Lab 04/02/16 0446  WBC 13.5*  HGB 14.0  HCT 41.8  PLT 181    Chemistries   Recent Labs Lab 04/02/16 0446  NA 133*  K 3.2*  CL 95*  CO2 31  GLUCOSE 104*  BUN 51*  CREATININE 2.00*  CALCIUM 8.4*  MG 2.5*  AST 5483*  ALT 4627*  ALKPHOS 101  BILITOT 3.2*    Cardiac Enzymes  Recent Labs Lab 03/31/16 1739  TROPONINI 0.19*    Microbiology Results  Results for orders placed or performed during the hospital encounter of 03/31/16  MRSA PCR Screening      Status: None   Collection Time: 03/31/16 12:54 PM  Result Value Ref Range Status   MRSA by PCR NEGATIVE NEGATIVE Final    Comment:        The GeneXpert MRSA Assay (FDA approved for NASAL specimens only), is one component of a comprehensive MRSA colonization surveillance program. It is not intended to diagnose MRSA infection nor to guide or monitor treatment for MRSA infections.     RADIOLOGY:  [place] Art/ven Flow Abd Pelv Doppler Limited  04/01/2016  CLINICAL DATA:  Elevated LFTs. EXAM: DUPLEX ULTRASOUND OF LIVER TECHNIQUE: Color and duplex Doppler ultrasound was performed to evaluate the hepatic in-flow and out-flow vessels. COMPARISON:  Ultrasound and CT from the previous day FINDINGS: Portal Vein 1.3 cm diameter. No evidence of occlusion or thrombus. Velocities (all hepatopetal): Main:  8-17 cm/sec Right:  16 cm/sec Left:  10 cm/sec Hepatic Vein Velocities (all hepatofugal): Right:  53 cm/sec Middle:  24 cm/sec Left:  19 cm/sec Intrahepatic IVC is patent, velocity 33 cm/second. Hepatic Artery Velocity:  61 cm/sec Spleen 9.3 x 10 x 3.7 cm. Splenic Vein shows no evidence of occlusion or thrombus. Velocity: 13 cm/sec Varices: None seen Ascites: Trace perisplenic and perihepatic IMPRESSION: 1. Normal liver vascular assessment. 2. Trace abdominal ascites. Electronically Signed   By: Corlis Leak M.D.   On: 04/01/2016 12:19   Dg Chest Port 1 View  04/01/2016  CLINICAL DATA:  Evaluate central line placement EXAM: PORTABLE CHEST 1 VIEW COMPARISON:  03/31/2016 FINDINGS: Left Central line is in place with the tip at the cavoatrial junction. No pneumothorax. Cardiomegaly. Mild vascular congestion. No confluent airspace opacities, effusions or overt edema. No acute bony abnormality. IMPRESSION: Cardiomegaly, vascular congestion. Electronically Signed   By: Charlett Nose M.D.   On: 04/01/2016 10:39   US Abdomen Limited Ruq  03/31/2016  CLINICAL DATA:  60 year old male with elevated LFTs. Patient currently  with CHF. EXAM: US ABDOMEN LIMITED - RIGHT UPPER QUADRANT COMPARISON:  None. FINDINGS: Gallbladder: Gallbladder wall thickening is noted with small amount of pericholecystic fluid. There is no evidence of cholelithiasis or sonographic Murphy sign. Common bile duct: Diameter: 3.2 mm. There is no evidence of intrahepatic or extrahepatic biliary dilatation. Liver: No focal lesion identified. Within normal limits in parenchymal echogenicity. A trace amount of ascites is noted. IMPRESSION: Gallbladder wall thickening without cholelithiasis or biliary dilatation. Gallbladder wall thickening is most likely related to hepatic dysfunction or congestive heart failure/ascites. Recommend nuclear medicine study  if there is strong clinical suspicion for acute cholecystitis. Trace ascites. Unremarkable liver. Electronically Signed   By: Harmon Pier M.D.   On: 03/31/2016 14:11     Management plans discussed with the patient, family and they are in agreement.  CODE STATUS: Full    Code Status Orders        Start     Ordered   03/31/16 1259  Full code   Continuous     03/31/16 1258    Code Status History    Date Active Date Inactive Code Status Order ID Comments User Context   This patient has a current code status but no historical code status.      TOTAL TIME TAKING CARE OF THIS PATIENT: 40 critical care minutes.    Altamese Dilling M.D on 04/02/2016 at 12:09 PM  Between 7am to 6pm - Pager - [phone]  After 6pm go to [email or web address] - Social research officer, government  [facility]  Office  [phone]  CC: Primary care physician; [facility]   Note: This dictation was prepared with Dragon dictation along with smaller phrase technology. Any transcriptional errors that result from this process are unintentional.

## 2016-04-02 NOTE — Progress Notes (Signed)
Pharmacy Consult for Electrolyte Monitoring   No Known Allergies  Patient Measurements: Height: 5\' 11"  (180.3 cm) Weight: 168 lb 6.9 oz (76.4 kg) IBW/kg (Calculated) : 75.3  Vital Signs: Temp: 97.7 F (36.5 C) (04/05 1445) Temp Source: Oral (04/05 1445) BP: 113/79 mmHg (04/05 1600) Pulse Rate: 94 (04/05 1619) Intake/Output from previous day: 04/04 0701 - 04/05 0700 In: 996.3 [P.O.:600; I.V.:396.3] Out: 5120 [Urine:5120] Intake/Output from this shift: Total I/O In: 401.7 [I.V.:401.7] Out: 975 [Urine:975]  Labs:  Recent Labs  03/31/16 0658 03/31/16 1245 04/01/16 0447 04/02/16 0008 04/02/16 0446  WBC 11.4*  --  21.8*  --  13.5*  HGB 13.4  --  13.2  --  14.0  HCT 40.4  --  38.9*  --  41.8  PLT 276  --  211  --  181  APTT  --   --   --  31  --   INR  --  1.84  --  2.68  --      Recent Labs  03/31/16 0658 03/31/16 1438 04/01/16 0447 04/02/16 0446  NA 132*  --  127* 133*  K 3.5  --  4.4 3.2*  CL 99*  --  93* 95*  CO2 23  --  22 31  GLUCOSE 146*  --  147* 104*  BUN 19  --  39* 51*  CREATININE 1.12  --  2.20* 2.00*  CALCIUM 8.9  --  8.5* 8.4*  MG <0.1* 2.9* 2.7* 2.5*  PHOS  --   --  5.8*  --   PROT 7.4  --  6.0* 6.1*  ALBUMIN 4.1  --  3.5 3.3*  AST 785*  --  >2275* 5483*  ALT 828*  --  5950* 4627*  ALKPHOS 81  --  76 101  BILITOT 2.9*  --  3.4* 3.2*  BILIDIR  --   --  1.7* 1.3*  IBILI  --   --  1.7* 1.9*   Estimated Creatinine Clearance: 42.4 mL/min (by C-G formula based on Cr of 2).    Recent Labs  03/31/16 1255  GLUCAP 152*    Medical History: Past Medical History  Diagnosis Date  . COPD (chronic obstructive pulmonary disease) (HCC)   . CHF (congestive heart failure) (HCC)   . Polysubstance abuse   . Shortness of breath dyspnea   . Anxiety   . Acute renal failure (HCC)     Medications:  Scheduled:  . ALPRAZolam  1 mg Oral TID  . aspirin  81 mg Oral Daily  . folic acid  1 mg Oral Daily  . furosemide  40 mg Intravenous Q12H  .  lactulose  10 g Oral BID  . LORazepam  0-4 mg Oral Q6H   Followed by  . [START ON 04/03/2016] LORazepam  0-4 mg Oral Q12H  . multivitamin with minerals  1 tablet Oral Daily  . thiamine  100 mg Oral Daily   Or  . thiamine  100 mg Intravenous Daily  . tiotropium  18 mcg Inhalation Daily   Infusions:  . amiodarone 30 mg/hr (04/02/16 1116)  . DOBUTamine 2.531 mcg/kg/min (04/02/16 0600)  . DOPamine 2.024 mcg/kg/min (04/02/16 0600)  . heparin 1,600 Units/hr (04/02/16 1007)    Assessment: Pharmacy consulted to assist in managing electrolytes in this 60 y/o M with afib.   Plan:  Will replace potassium with KCl 40 meq po once and recheck at 1800. Will recheck electrolytes with AM labs.   Luisa Hart D 04/02/2016,4:28 PM

## 2016-04-02 NOTE — Care Management (Signed)
Informed a transfer has been arranged to [facility].

## 2016-04-02 NOTE — Plan of Care (Signed)
Problem: Activity: Goal: Capacity to carry out activities will improve Outcome: Progressing Pt. Able to stand and use urinal, can reposition in bed without assistance- little unsteady on feet  Problem: Cardiac: Goal: Ability to achieve and maintain adequate cardiopulmonary perfusion will improve Outcome: Progressing Pt. On dopamine, dobutamine, amio and heparin gtt's Output WDL, VSS since restarting amio. gtt

## 2016-04-02 NOTE — Progress Notes (Signed)
GI Inpatient Follow-up Note  Patient Identification: Caleb Barnes is a 60 y.o. male with elev liver enzymes   Subjective:  Feels better today.  Still some SOB. No c/p.  No confusion, rectal bleeding, n/v, abd pain.  Lliver enzymes worsening as is INR.   Scheduled Inpatient Medications:  . ALPRAZolam  1 mg Oral TID  . antiseptic oral rinse  7 mL Mouth Rinse BID  . aspirin  81 mg Oral Daily  . folic acid  1 mg Oral Daily  . furosemide  40 mg Intravenous Q12H  . LORazepam  0-4 mg Oral Q6H   Followed by  . [START ON 04/03/2016] LORazepam  0-4 mg Oral Q12H  . multivitamin with minerals  1 tablet Oral Daily  . potassium chloride  40 mEq Oral Once  . thiamine  100 mg Oral Daily   Or  . thiamine  100 mg Intravenous Daily  . tiotropium  18 mcg Inhalation Daily    Continuous Inpatient Infusions:   . amiodarone 60 mg/hr (04/02/16 0600)   Followed by  . amiodarone    . DOBUTamine 2.531 mcg/kg/min (04/02/16 0600)  . DOPamine 2.024 mcg/kg/min (04/02/16 0600)  . heparin 1,300 Units/hr (04/02/16 0600)    PRN Inpatient Medications:  ipratropium-albuterol, LORazepam **OR** LORazepam, metoprolol, sodium chloride flush  Review of Systems: Constitutional: Weight is increased.  Eyes: No changes in vision. ENT: No oral lesions, sore throat.  GI: see HPI.  Heme/Lymph: + easy bruising.  CV: No chest pain.  GU: No hematuria.  Integumentary: No rashes.  Neuro: No headaches.  Psych: No depression/anxiety.  Endocrine: No heat/cold intolerance.  Allergic/Immunologic: No urticaria.  Resp: No cough, + SOB.  Musculoskeletal: No joint swelling.    Physical Examination: BP 104/73 mmHg  Pulse 88  Temp(Src) 98.6 F (37 C) (Axillary)  Resp 17  Ht 5\' 11"  (1.803 m)  Wt 76.4 kg (168 lb 6.9 oz)  BMI 23.50 kg/m2  SpO2 97% Gen: NAD, alert and oriented x 4 Neck: supple, + JVD, no  thyromegaly Chest: CTA bilaterally, no wheezes, crackles, or other adventitious sounds CV: RRR, no m/g/c/r Abd:  soft, NT, ND, +BS in all four quadrants; no HSM, guarding, ridigity, or rebound tenderness Ext: + mild edema, well perfused with 2+ pulses, Skin: no rash or lesions noted Lymph: no LAD  Data: Lab Results  Component Value Date   WBC 13.5* 04/02/2016   HGB 14.0 04/02/2016   HCT 41.8 04/02/2016   MCV 97.7 04/02/2016   PLT 181 04/02/2016    Recent Labs Lab 03/31/16 0658 04/01/16 0447 04/02/16 0446  HGB 13.4 13.2 14.0   Lab Results  Component Value Date   NA 133* 04/02/2016   K 3.2* 04/02/2016   CL 95* 04/02/2016   CO2 31 04/02/2016   BUN 51* 04/02/2016   CREATININE 2.00* 04/02/2016   Lab Results  Component Value Date   ALT 4627* 04/02/2016   AST 5483* 04/02/2016   ALKPHOS 101 04/02/2016   BILITOT 3.2* 04/02/2016    Recent Labs Lab 04/02/16 0008  APTT 31  INR 2.68   Assessment/Plan: Mr. Cogar is a 60 y.o. male with elev liver enzymes.  Much worse  Past two days. Now AST and ALT in 4 and 5 thousands and INR out to 2.68.  This is likely severe ischemic hepatopathy from the CHF and RVR.  Hopefully will be improving on the ionotropoes.  HIgh risk for H.E and hepatic decompensation, liver synthetic function is poor currently.   Recommendations: -  start lactulose 30 gm BID - avoid sedating meds - daily liver enzymes and INR - watch closely for changes in mental status.  - continue to reverse underlying CHF to improve blood flow to liver.   Please call with questions or concerns.  Elpidio Thielen, Addison Naegeli, MD

## 2016-04-02 NOTE — Progress Notes (Signed)
ANTICOAGULATION CONSULT NOTE - Initial Consult  Pharmacy Consult for heparin drip Indication: atrial fibrillation  No Known Allergies  Patient Measurements: Height: 5\' 11"  (180.3 cm) Weight: 168 lb 6.9 oz (76.4 kg) IBW/kg (Calculated) : 75.3 Heparin Dosing Weight: 76kg  Vital Signs: Temp: 98 F (36.7 C) (04/04 2000) Temp Source: Oral (04/04 2000) BP: 113/73 mmHg (04/05 0145) Pulse Rate: 49 (04/05 0145)  Labs:  Recent Labs  03/31/16 0658 03/31/16 1245 03/31/16 1739 04/01/16 0447 04/02/16 0008  HGB 13.4  --   --  13.2  --   HCT 40.4  --   --  38.9*  --   PLT 276  --   --  211  --   APTT  --   --   --   --  31  LABPROT  --  21.2*  --   --  28.1*  INR  --  1.84  --   --  2.68  CREATININE 1.12  --   --  2.20*  --   TROPONINI 0.17* 0.20* 0.19*  --   --     Estimated Creatinine Clearance: 38.5 mL/min (by C-G formula based on Cr of 2.2).   Medical History: Past Medical History  Diagnosis Date  . COPD (chronic obstructive pulmonary disease) (HCC)   . CHF (congestive heart failure) (HCC)   . Polysubstance abuse   . Shortness of breath dyspnea   . Anxiety   . Acute renal failure (HCC)     Medications:    Assessment: INR 2.68. Does not appear to have been on anticoagulation.  Goal of Therapy:  Heparin level 0.3-0.7 units/ml Monitor platelets by anticoagulation protocol: Yes   Plan:  No bolus due to elevated INR.  Initial rate of 1300 units/hr. First heparin level 6 hours after start of infusion.  Caleb Barnes 04/02/2016,2:44 AM

## 2016-04-02 NOTE — Progress Notes (Signed)
Patient arrived in transfer from [place]. Seen by Dr. Herbie Baltimore earlier today there. He currently feels well. Breathing is much better. He is in NSR with rate 92. BP 111/70. sats 99% on [place] oxygen. Lungs with minimal rales.  Will continue his current therapy except discontinue IV Dopamine. On IV Dobutamine at low dose. IV amiodarone. On CIWA protocol.   Heart failure team to see in am.  French Kendra [place] MD, [facility]  04/02/2016 6:21 PM

## 2016-04-02 NOTE — Progress Notes (Signed)
[facility] picked up patient via stretcher to transport to [facility] 2 Heart Bed 10. Dobutamine at 2.5 mcg, Dopamine at 2 mcg, Heparin at 1600 units/ hr, and Amiodarone at 16.7 mL/hr. Pt's clothing sent home with Mother. Pt signed EMTALA.

## 2016-04-03 DIAGNOSIS — N179 Acute kidney failure, unspecified: Secondary | ICD-10-CM

## 2016-04-03 DIAGNOSIS — I5021 Acute systolic (congestive) heart failure: Principal | ICD-10-CM

## 2016-04-03 DIAGNOSIS — I4891 Unspecified atrial fibrillation: Secondary | ICD-10-CM

## 2016-04-03 LAB — COMPREHENSIVE METABOLIC PANEL
ALT: 3216 U/L — ABNORMAL HIGH (ref 17–63)
AST: 1763 U/L — ABNORMAL HIGH (ref 15–41)
Albumin: 3.1 g/dL — ABNORMAL LOW (ref 3.5–5.0)
Alkaline Phosphatase: 91 U/L (ref 38–126)
Anion gap: 12 (ref 5–15)
BUN: 30 mg/dL — ABNORMAL HIGH (ref 6–20)
CO2: 32 mmol/L (ref 22–32)
Calcium: 8.9 mg/dL (ref 8.9–10.3)
Chloride: 93 mmol/L — ABNORMAL LOW (ref 101–111)
Creatinine, Ser: 1.49 mg/dL — ABNORMAL HIGH (ref 0.61–1.24)
GFR calc Af Amer: 58 mL/min — ABNORMAL LOW (ref >60)
GFR calc non Af Amer: 50 mL/min — ABNORMAL LOW (ref >60)
Glucose, Bld: 104 mg/dL — ABNORMAL HIGH (ref 65–99)
Potassium: 3.4 mmol/L — ABNORMAL LOW (ref 3.5–5.1)
Sodium: 137 mmol/L (ref 135–145)
Total Bilirubin: 3.1 mg/dL — ABNORMAL HIGH (ref 0.3–1.2)
Total Protein: 6 g/dL — ABNORMAL LOW (ref 6.5–8.1)

## 2016-04-03 LAB — CARBOXYHEMOGLOBIN
Carboxyhemoglobin: 1.4 % (ref 0.5–1.5)
Methemoglobin: 0.8 % (ref 0.0–1.5)
O2 Saturation: 60.5 %
Total hemoglobin: 15.3 g/dL (ref 13.5–18.0)

## 2016-04-03 LAB — BRAIN NATRIURETIC PEPTIDE: B Natriuretic Peptide: 887.2 pg/mL — ABNORMAL HIGH (ref 0.0–100.0)

## 2016-04-03 LAB — PROTIME-INR
INR: 1.83 — ABNORMAL HIGH (ref 0.00–1.49)
Prothrombin Time: 21.1 seconds — ABNORMAL HIGH (ref 11.6–15.2)

## 2016-04-03 LAB — CBC WITH DIFFERENTIAL/PLATELET
Basophils Absolute: 0.1 10*3/uL (ref 0.0–0.1)
Basophils Relative: 1 %
Eosinophils Absolute: 0.1 10*3/uL (ref 0.0–0.7)
Eosinophils Relative: 1 %
HCT: 44 % (ref 39.0–52.0)
Hemoglobin: 14.4 g/dL (ref 13.0–17.0)
Lymphocytes Relative: 9 %
Lymphs Abs: 0.8 10*3/uL (ref 0.7–4.0)
MCH: 32 pg (ref 26.0–34.0)
MCHC: 32.7 g/dL (ref 30.0–36.0)
MCV: 97.8 fL (ref 78.0–100.0)
Monocytes Absolute: 0.6 10*3/uL (ref 0.1–1.0)
Monocytes Relative: 7 %
Neutro Abs: 7.4 10*3/uL (ref 1.7–7.7)
Neutrophils Relative %: 82 %
Platelets: 185 10*3/uL (ref 150–400)
RBC: 4.5 MIL/uL (ref 4.22–5.81)
RDW: 15 % (ref 11.5–15.5)
WBC: 9.1 10*3/uL (ref 4.0–10.5)

## 2016-04-03 LAB — HEPARIN LEVEL (UNFRACTIONATED): Heparin Unfractionated: 0.59 IU/mL (ref 0.30–0.70)

## 2016-04-03 LAB — MAGNESIUM: Magnesium: 2 mg/dL (ref 1.7–2.4)

## 2016-04-03 MED ORDER — POTASSIUM CHLORIDE CRYS ER 20 MEQ PO TBCR
40.0000 meq | EXTENDED_RELEASE_TABLET | Freq: Once | ORAL | Status: AC
  Administered 2016-04-03: 40 meq via ORAL
  Filled 2016-04-03: qty 2

## 2016-04-03 MED ORDER — SPIRONOLACTONE 25 MG PO TABS
12.5000 mg | ORAL_TABLET | Freq: Every day | ORAL | Status: DC
  Administered 2016-04-03 – 2016-04-08 (×6): 12.5 mg via ORAL
  Filled 2016-04-03 (×6): qty 1

## 2016-04-03 MED ORDER — DIGOXIN 125 MCG PO TABS
0.1250 mg | ORAL_TABLET | Freq: Every day | ORAL | Status: DC
  Administered 2016-04-03 – 2016-04-08 (×6): 0.125 mg via ORAL
  Filled 2016-04-03 (×6): qty 1

## 2016-04-03 MED ORDER — FUROSEMIDE 40 MG PO TABS: 40.0000 mg | ORAL_TABLET | Freq: Once | ORAL | Status: DC

## 2016-04-03 NOTE — Progress Notes (Signed)
ANTICOAGULATION CONSULT NOTE - Follow-up Consult  Pharmacy Consult for Heparin Indication: atrial fibrillation  No Known Allergies  Patient Measurements: Height: 5\' 11"  (180.3 cm) Weight: 154 lb 12.2 oz (70.2 kg) IBW/kg (Calculated) : 75.3 Heparin Dosing Weight: 70.9 kg  Vital Signs: Temp: 97.8 F (36.6 C) (04/06 0400) Temp Source: Oral (04/06 0400) BP: 100/74 mmHg (04/06 0500) Pulse Rate: 85 (04/06 0500)  Labs:  Recent Labs  03/31/16 0658 03/31/16 1245 03/31/16 1739 04/01/16 0447 04/02/16 0008 04/02/16 0446 04/02/16 0625 04/02/16 1450 04/03/16 0500 04/03/16 0601  HGB 13.4  --   --  13.2  --  14.0  --   --  14.4  --   HCT 40.4  --   --  38.9*  --  41.8  --   --  44.0  --   PLT 276  --   --  211  --  181  --   --  185  --   APTT  --   --   --   --  31  --   --   --   --   --   LABPROT  --  21.2*  --   --  28.1*  --   --   --  21.1*  --   INR  --  1.84  --   --  2.68  --   --   --  1.83*  --   HEPARINUNFRC  --   --   --   --   --   --  <0.10* 0.36  --  0.59  CREATININE 1.12  --   --  2.20*  --  2.00*  --   --   --   --   TROPONINI 0.17* 0.20* 0.19*  --   --   --   --   --   --   --     Estimated Creatinine Clearance: 39.5 mL/min (by C-G formula based on Cr of 2).   Assessment: 60 yo on heparin for afib. Heparin level remains therapeutic. CBC stable. INR 1.83 but pt not on AC at home per [facility] records.  Goal of Therapy:  Heparin level 0.3-0.7 units/ml Monitor platelets by anticoagulation protocol: Yes   Plan:  Continue heparin gtt at 1600 units/hr Daily heparin level and CBC  Christoper Fabian, PharmD, BCPS Clinical pharmacist, pager [phone] 04/03/2016 6:24 AM

## 2016-04-03 NOTE — Care Management Note (Signed)
Case Management Note  Patient Details  Name: Caleb Barnes MRN: [id number] Date of Birth: [birth year]  Subjective/Objective:     Adm w chf               Action/Plan: lives alone, pcp [facility]   Expected Discharge Date:                  Expected Discharge Plan:  Home w Home Health Services  In-House Referral:     Discharge planning Services     Post Acute Care Choice:    Choice offered to:     DME Arranged:    DME Agency:     HH Arranged:    HH Agency:     Status of Service:     Medicare Important Message Given:    Date Medicare IM Given:    Medicare IM give by:    Date Additional Medicare IM Given:    Additional Medicare Important Message give by:     If discussed at Long Length of Stay Meetings, dates discussed:    Additional Comments: ur review done  Hanley Hays, RN 04/03/2016, 8:02 AM

## 2016-04-03 NOTE — Progress Notes (Signed)
MD notified of SBP in the 80's. Stated okay with that BP for a CHF pt. Will continue to monitor.

## 2016-04-03 NOTE — Progress Notes (Addendum)
Advanced Heart Failure Rounding Note   Subjective:    Caleb Barnes 60 y.o. male with h/o tobacco abuse up until 2014 and ETOH abuse. He presented to [facility] ED on 4/3 with SOB. Had elevated BNP and LFTs. He was found to be in atrial flutter with RVR with 2:1 conduction. CXR concerning for edema. ECHO with EF 20-25%. GI consulted due to elevated LFTs. Abd [place] with gall bladder thickening but normal-appearing liver.  CTA chest with no PE.   Started on amio drip and diuresed with 40 mg IV lasix. Yesterday he was had limited urine output and was started dopamine and dobutamine. Due to worsening condition he was transferred to [facility] for HF management. Dopamine stopped last night.   Denies SOB/CP.  He is back in NSR this morning and feels much better.    Objective:   Weight Range:  Vital Signs:   Temp:  [97.8 F (36.6 C)] 97.8 F (36.6 C) (04/06 0400) Pulse Rate:  [69-101] 83 (04/06 0600) Resp:  [16-22] 19 (04/06 0600) BP: (95-132)/(67-86) 106/79 mmHg (04/06 0600) SpO2:  [89 %-100 %] 96 % (04/06 0600) Weight:  [154 lb 12.2 oz (70.2 kg)-156 lb 4.9 oz (70.9 kg)] 154 lb 12.2 oz (70.2 kg) (04/06 0400) Last BM Date: 03/31/16  Weight change: Filed Weights   04/02/16 1800 04/03/16 0400  Weight: 156 lb 4.9 oz (70.9 kg) 154 lb 12.2 oz (70.2 kg)    Intake/Output:   Intake/Output Summary (Last 24 hours) at 04/03/16 0719 Last data filed at 04/03/16 0600  Gross per 24 hour  Intake 1182.11 ml  Output   2700 ml  Net -1517.89 ml     Physical Exam: General:  Well appearing. No resp difficulty. In bed  HEENT: normal. [facility]  Neck: supple. JVP 5-6  . Carotids 2+ bilat; no bruits. No lymphadenopathy or thryomegaly appreciated. Cor: PMI nondisplaced. Regular rate & rhythm. No rubs, gallops or murmurs. Lungs: clear Abdomen: soft, nontender, nondistended. No hepatosplenomegaly. No bruits or masses. Good bowel sounds. Extremities: no cyanosis, clubbing, rash, edema R and LLE warm  Neuro: alert & orientedx3,  cranial nerves grossly intact. moves all 4 extremities w/o difficulty. Affect pleasant  Telemetry: Sinus Tach   Labs: Basic Metabolic Panel:  Recent Labs Lab 03/31/16 0658 03/31/16 1438 04/01/16 0447 04/02/16 0446 04/03/16 0500  NA 132*  --  127* 133* 137  K 3.5  --  4.4 3.2* 3.4*  CL 99*  --  93* 95* 93*  CO2 23  --  22 31 32  GLUCOSE 146*  --  147* 104* 104*  BUN 19  --  39* 51* 30*  CREATININE 1.12  --  2.20* 2.00* 1.49*  CALCIUM 8.9  --  8.5* 8.4* 8.9  MG <0.1* 2.9* 2.7* 2.5* 2.0  PHOS  --   --  5.8*  --   --     Liver Function Tests:  Recent Labs Lab 03/31/16 0658 04/01/16 0447 04/02/16 0446 04/03/16 0500  AST 785* >2275* 5483* 1763*  ALT 828* 5950* 4627* 3216*  ALKPHOS 81 76 101 91  BILITOT 2.9* 3.4* 3.2* 3.1*  PROT 7.4 6.0* 6.1* 6.0*  ALBUMIN 4.1 3.5 3.3* 3.1*   No results for input(s): LIPASE, AMYLASE in the last 168 hours. No results for input(s): AMMONIA in the last 168 hours.  CBC:  Recent Labs Lab 03/31/16 0658 04/01/16 0447 04/02/16 0446 04/03/16 0500  WBC 11.4* 21.8* 13.5* 9.1  NEUTROABS  --   --   --  7.4  HGB 13.4 13.2 14.0 14.4  HCT 40.4 38.9* 41.8 44.0  MCV 98.0 98.1 97.7 97.8  PLT 276 211 181 185    Cardiac Enzymes:  Recent Labs Lab 03/31/16 0658 03/31/16 1245 03/31/16 1739  TROPONINI 0.17* 0.20* 0.19*    BNP: BNP (last 3 results)  Recent Labs  03/31/16 0658 04/03/16 0500  BNP 1693.0* 887.2*    ProBNP (last 3 results) No results for input(s): PROBNP in the last 8760 hours.    Other results:  Imaging: [place] Art/ven Flow Abd Pelv Doppler Limited  04/01/2016  CLINICAL DATA:  Elevated LFTs. EXAM: DUPLEX ULTRASOUND OF LIVER TECHNIQUE: Color and duplex Doppler ultrasound was performed to evaluate the hepatic in-flow and out-flow vessels. COMPARISON:  Ultrasound and CT from the previous day FINDINGS: Portal Vein 1.3 cm diameter. No evidence of occlusion or thrombus. Velocities (all hepatopetal): Main:  8-17 cm/sec Right:   16 cm/sec Left:  10 cm/sec Hepatic Vein Velocities (all hepatofugal): Right:  53 cm/sec Middle:  24 cm/sec Left:  19 cm/sec Intrahepatic IVC is patent, velocity 33 cm/second. Hepatic Artery Velocity:  61 cm/sec Spleen 9.3 x 10 x 3.7 cm. Splenic Vein shows no evidence of occlusion or thrombus. Velocity: 13 cm/sec Varices: None seen Ascites: Trace perisplenic and perihepatic IMPRESSION: 1. Normal liver vascular assessment. 2. Trace abdominal ascites. Electronically Signed   By: Corlis Leak M.D.   On: 04/01/2016 12:19   Dg Chest Port 1 View  04/01/2016  CLINICAL DATA:  Evaluate central line placement EXAM: PORTABLE CHEST 1 VIEW COMPARISON:  03/31/2016 FINDINGS: Left Central line is in place with the tip at the cavoatrial junction. No pneumothorax. Cardiomegaly. Mild vascular congestion. No confluent airspace opacities, effusions or overt edema. No acute bony abnormality. IMPRESSION: Cardiomegaly, vascular congestion. Electronically Signed   By: Charlett Nose M.D.   On: 04/01/2016 10:39      Medications:     Scheduled Medications: . ALPRAZolam  1 mg Oral TID  . aspirin EC  81 mg Oral Daily  . folic acid  1 mg Oral Daily  . furosemide  40 mg Intravenous Q12H  . lactulose  10 g Oral BID  . sodium chloride flush  3 mL Intravenous Q12H  . thiamine  100 mg Oral Daily  . tiotropium  18 mcg Inhalation Daily     Infusions: . amiodarone 30 mg/hr (04/03/16 0000)  . DOBUTamine 2.5 mcg/kg/min (04/02/16 2000)  . heparin 1,600 Units/hr (04/02/16 2035)     PRN Medications:  sodium chloride, acetaminophen, ondansetron (ZOFRAN) IV, sodium chloride flush     Assessment/Plan/Discussion:    1. New Acute Systolic Heart Failure - ? Tachy mediated ? ETOH induced . Has not had ischemic work up but has had episodes for chest tightness.  Troponin 0.17>0.19>0.2.Plan for LHC/RHC in am if creatinine remains stable.  ECHO EF 20-25%. He is not decompensated. Volume status does not appear elevated. Hold lasix for  now. Set up CVP.  Stop dobutamine. Check CO-OX  If less than 55% can restart dobutamine but would like to establish a baseline. Hold off on bb.  Can start spironolactone at low dose today, hold off on ACEI with creatinine still elevated and recent AKI.  2. New Onset A Flutter RVR  -On Heparin and Amiodarone drip. Continue to load on amio drip. Converted to sinus rhythm last night.  3. Elevated LFTs-GI consulted at [facility]. US abdomen showed unremarkable liver, trace ascites. LFTs coming down => suspect shock liver.   4. NSVT- K  3.4. Supplement K.   5. AKI- Creatinine 1.1 but peaked 2.2. ? Diuresis versus low output.  Today creatinine is down to 1.49. Hold off adding Ace/Spiro with plan for [facility] tomorrow.  6. ETOH- Was on CIWA protocol at [facility]. Ok today. H/O 2-3 beers per day and 12 beers daily on the weekend.  7.  Former Smoker- Quit 2 years ago   Consult cardiac rehab.   Length of Stay: 1   Amy Clegg NP-C  04/03/2016, 7:19 AM  Advanced Heart Failure Team Pager [phone] (M-F; 7a - 4p)  Please contact [facility] for night-coverage after hours (4p -7a ) and weekends on [email or web address]  Patient seen with NP, agree with the above note.  He is back in NSR this morning.  LFTs coming down.   1. Acute systolic CHF: ?etiology.  Possibilities include tachy-mediated CMP with atrial fib/RVR of unknown duration and ETOH CMP.  Cannot rule out CAD.  CVP 3 this morning with co-ox 61% (had been off dobutamine for about 30 minutes).  - Can hold Lasix today, probably start po tomorrow.  - Add digoxin and low dose spironolactone today.   - Possible low dose ACEI tomorrow if creatinine continues to come down.  - LHC/RHC tomorrow to assess for coronary disease and assess cardiac output.  Discussed risks/benefits with patient, he agrees to proceed.  2. Elevated LFTs: Suspect shock liver.  Abdominal US ok at [facility].  Coming down.  Can continue amiodarone. 3. Atrial fibrillation: Paroxysmal, now back in NSR on  amiodarone.  Uncertain duration.  Continue heparin gtt, switch to NOAC post-cath.  4. ETOH abuse: Counseled to quit.  5. AKI: In setting of suspected cardiogenic shock.  Now resolving.  45 minutes critical care time.   [name] 04/03/2016 9:08 AM

## 2016-04-03 NOTE — Progress Notes (Signed)
CARDIAC REHAB PHASE I   PRE:  Rate/Rhythm: 87 SR  BP:  Sitting: 101/74        SaO2: 97 RA  MODE:  Ambulation: 350 ft   POST:  Rate/Rhythm: 98 SR  BP:  Sitting: 101/82         SaO2: 100 RA  Pt stood independently, did c/o mild dizziness upon standing. Pt ambulated 350 ft on RA, IV, gait belt (pt has not been out of bed since admission), assist x1, fairly steady gait, tolerated well. Pt c/o generalized weakness, fatigue, denies any specific complaints. Pt to recliner after walk, feet elevated, call bell within reach. Will follow.   [id number] Joylene Grapes, RN, BSN 04/03/2016 10:42 AM

## 2016-04-04 ENCOUNTER — Ambulatory Visit (HOSPITAL_COMMUNITY): Admission: RE | Admit: 2016-04-04 | Payer: 59 | Source: Ambulatory Visit | Admitting: Cardiology

## 2016-04-04 ENCOUNTER — Encounter (HOSPITAL_COMMUNITY)
Admission: AD | Disposition: A | Discharge status: Home / Self Care | Payer: Self-pay | Source: Other Acute Inpatient Hospital | Attending: Cardiology

## 2016-04-04 ENCOUNTER — Encounter (HOSPITAL_COMMUNITY): Payer: Self-pay | Admitting: Cardiology

## 2016-04-04 DIAGNOSIS — I2510 Atherosclerotic heart disease of native coronary artery without angina pectoris: Secondary | ICD-10-CM

## 2016-04-04 DIAGNOSIS — I480 Paroxysmal atrial fibrillation: Secondary | ICD-10-CM

## 2016-04-04 HISTORY — PX: CARDIAC CATHETERIZATION: SHX172

## 2016-04-04 LAB — CBC
HCT: 43.3 % (ref 39.0–52.0)
Hemoglobin: 14.2 g/dL (ref 13.0–17.0)
MCH: 32 pg (ref 26.0–34.0)
MCHC: 32.8 g/dL (ref 30.0–36.0)
MCV: 97.5 fL (ref 78.0–100.0)
Platelets: 177 10*3/uL (ref 150–400)
RBC: 4.44 MIL/uL (ref 4.22–5.81)
RDW: 14.9 % (ref 11.5–15.5)
WBC: 8.5 10*3/uL (ref 4.0–10.5)

## 2016-04-04 LAB — BASIC METABOLIC PANEL
Anion gap: 11 (ref 5–15)
BUN: 21 mg/dL — ABNORMAL HIGH (ref 6–20)
CO2: 27 mmol/L (ref 22–32)
Calcium: 8.4 mg/dL — ABNORMAL LOW (ref 8.9–10.3)
Chloride: 95 mmol/L — ABNORMAL LOW (ref 101–111)
Creatinine, Ser: 1.32 mg/dL — ABNORMAL HIGH (ref 0.61–1.24)
GFR calc Af Amer: >60 mL/min (ref >60)
GFR calc non Af Amer: 57 mL/min — ABNORMAL LOW (ref >60)
Glucose, Bld: 105 mg/dL — ABNORMAL HIGH (ref 65–99)
Potassium: 3.9 mmol/L (ref 3.5–5.1)
Sodium: 133 mmol/L — ABNORMAL LOW (ref 135–145)

## 2016-04-04 LAB — POCT I-STAT 3, VENOUS BLOOD GAS (G3P V)
Acid-Base Excess: 2 mmol/L (ref 0.0–2.0)
Acid-Base Excess: 2 mmol/L (ref 0.0–2.0)
Bicarbonate: 28.9 mEq/L — ABNORMAL HIGH (ref 20.0–24.0)
Bicarbonate: 29.5 mEq/L — ABNORMAL HIGH (ref 20.0–24.0)
O2 Saturation: 46 %
O2 Saturation: 47 %
TCO2: 31 mmol/L (ref 0–100)
TCO2: 31 mmol/L (ref 0–100)
pCO2, Ven: 51.8 mmHg — ABNORMAL HIGH (ref 45.0–50.0)
pCO2, Ven: 55.3 mmHg — ABNORMAL HIGH (ref 45.0–50.0)
pH, Ven: 7.336 — ABNORMAL HIGH (ref 7.250–7.300)
pH, Ven: 7.355 — ABNORMAL HIGH (ref 7.250–7.300)
pO2, Ven: 27 mmHg — ABNORMAL LOW (ref 31.0–45.0)
pO2, Ven: 27 mmHg — ABNORMAL LOW (ref 31.0–45.0)

## 2016-04-04 LAB — POCT ACTIVATED CLOTTING TIME
Activated Clotting Time: 178 seconds
Activated Clotting Time: 198 seconds

## 2016-04-04 LAB — CARBOXYHEMOGLOBIN
Carboxyhemoglobin: 1.5 % (ref 0.5–1.5)
Methemoglobin: 0.7 % (ref 0.0–1.5)
O2 Saturation: 58.4 %
Total hemoglobin: 13.9 g/dL (ref 13.5–18.0)

## 2016-04-04 LAB — PROTIME-INR
INR: 1.63 — ABNORMAL HIGH (ref 0.00–1.49)
Prothrombin Time: 19.3 seconds — ABNORMAL HIGH (ref 11.6–15.2)

## 2016-04-04 LAB — HEPARIN LEVEL (UNFRACTIONATED): Heparin Unfractionated: 0.64 IU/mL (ref 0.30–0.70)

## 2016-04-04 SURGERY — RIGHT/LEFT HEART CATH AND CORONARY ANGIOGRAPHY

## 2016-04-04 MED ORDER — SODIUM CHLORIDE 0.9% FLUSH: 3.0000 mL | INTRAVENOUS | Status: DC | PRN

## 2016-04-04 MED ORDER — SODIUM CHLORIDE 0.9 % IV SOLN: INTRAVENOUS | Status: DC

## 2016-04-04 MED ORDER — SODIUM CHLORIDE 0.9% FLUSH
3.0000 mL | Freq: Two times a day (BID) | INTRAVENOUS | Status: DC
  Administered 2016-04-04 – 2016-04-05 (×2): 3 mL via INTRAVENOUS

## 2016-04-04 MED ORDER — FENTANYL CITRATE (PF) 100 MCG/2ML IJ SOLN
INTRAMUSCULAR | Status: AC
  Filled 2016-04-04: qty 2

## 2016-04-04 MED ORDER — FENTANYL CITRATE (PF) 100 MCG/2ML IJ SOLN
INTRAMUSCULAR | Status: DC | PRN
  Administered 2016-04-04: 25 ug via INTRAVENOUS

## 2016-04-04 MED ORDER — MIDAZOLAM HCL 2 MG/2ML IJ SOLN
INTRAMUSCULAR | Status: DC | PRN
  Administered 2016-04-04: 1 mg via INTRAVENOUS

## 2016-04-04 MED ORDER — SODIUM CHLORIDE 0.9 % IV SOLN: 250.0000 mL | INTRAVENOUS | Status: DC | PRN

## 2016-04-04 MED ORDER — FUROSEMIDE 10 MG/ML IJ SOLN
40.0000 mg | Freq: Once | INTRAMUSCULAR | Status: AC
  Administered 2016-04-04: 40 mg via INTRAVENOUS
  Filled 2016-04-04: qty 4

## 2016-04-04 MED ORDER — LIDOCAINE HCL (PF) 1 % IJ SOLN
INTRAMUSCULAR | Status: AC
  Filled 2016-04-04: qty 30

## 2016-04-04 MED ORDER — VERAPAMIL HCL 2.5 MG/ML IV SOLN
INTRAVENOUS | Status: AC
  Filled 2016-04-04: qty 2

## 2016-04-04 MED ORDER — POTASSIUM CHLORIDE CRYS ER 20 MEQ PO TBCR
40.0000 meq | EXTENDED_RELEASE_TABLET | Freq: Once | ORAL | Status: AC
Start: 2016-04-04 — End: 2016-04-04
  Administered 2016-04-04: 40 meq via ORAL
  Filled 2016-04-04 (×2): qty 2

## 2016-04-04 MED ORDER — SODIUM CHLORIDE 0.9% FLUSH
3.0000 mL | Freq: Two times a day (BID) | INTRAVENOUS | Status: DC
  Administered 2016-04-04: 3 mL via INTRAVENOUS

## 2016-04-04 MED ORDER — VERAPAMIL HCL 2.5 MG/ML IV SOLN
INTRAVENOUS | Status: DC | PRN
  Administered 2016-04-04: 10 mL via INTRA_ARTERIAL

## 2016-04-04 MED ORDER — IOPAMIDOL (ISOVUE-370) INJECTION 76%
INTRAVENOUS | Status: DC | PRN
  Administered 2016-04-04: 45 mL via INTRA_ARTERIAL

## 2016-04-04 MED ORDER — MILRINONE IN DEXTROSE 20 MG/100ML IV SOLN
0.1250 ug/kg/min | INTRAVENOUS | Status: DC
  Administered 2016-04-04: 0.25 ug/kg/min via INTRAVENOUS
  Administered 2016-04-05: 0.125 ug/kg/min via INTRAVENOUS
  Administered 2016-04-05: 0.25 ug/kg/min via INTRAVENOUS
  Filled 2016-04-04 (×4): qty 100

## 2016-04-04 MED ORDER — MIDAZOLAM HCL 2 MG/2ML IJ SOLN
INTRAMUSCULAR | Status: AC
  Filled 2016-04-04: qty 2

## 2016-04-04 MED ORDER — ONDANSETRON HCL 4 MG/2ML IJ SOLN: 4.0000 mg | Freq: Four times a day (QID) | INTRAMUSCULAR | Status: DC | PRN

## 2016-04-04 MED ORDER — APIXABAN 5 MG PO TABS
5.0000 mg | ORAL_TABLET | Freq: Two times a day (BID) | ORAL | Status: DC
  Administered 2016-04-04 – 2016-04-08 (×8): 5 mg via ORAL
  Filled 2016-04-04 (×8): qty 1

## 2016-04-04 MED ORDER — HEPARIN SODIUM (PORCINE) 1000 UNIT/ML IJ SOLN
INTRAMUSCULAR | Status: DC | PRN
  Administered 2016-04-04: 3500 [IU] via INTRAVENOUS

## 2016-04-04 MED ORDER — ASPIRIN 81 MG PO CHEW: 81.0000 mg | CHEWABLE_TABLET | ORAL | Status: DC

## 2016-04-04 MED ORDER — IOPAMIDOL (ISOVUE-370) INJECTION 76%
INTRAVENOUS | Status: AC
  Filled 2016-04-04: qty 100

## 2016-04-04 MED ORDER — FUROSEMIDE 40 MG PO TABS
40.0000 mg | ORAL_TABLET | Freq: Every day | ORAL | Status: DC
  Administered 2016-04-05 – 2016-04-06 (×2): 40 mg via ORAL
  Filled 2016-04-04 (×2): qty 1

## 2016-04-04 MED ORDER — HEPARIN (PORCINE) IN NACL 2-0.9 UNIT/ML-% IJ SOLN
INTRAMUSCULAR | Status: DC | PRN
  Administered 2016-04-04: 09:00:00

## 2016-04-04 MED ORDER — LIDOCAINE HCL (PF) 1 % IJ SOLN
INTRAMUSCULAR | Status: DC | PRN
  Administered 2016-04-04: 10 mL
  Administered 2016-04-04: 2 mL

## 2016-04-04 MED ORDER — ASPIRIN 81 MG PO CHEW
81.0000 mg | CHEWABLE_TABLET | Freq: Once | ORAL | Status: AC
  Administered 2016-04-04: 81 mg via ORAL
  Filled 2016-04-04: qty 1

## 2016-04-04 MED ORDER — HEPARIN (PORCINE) IN NACL 2-0.9 UNIT/ML-% IJ SOLN
INTRAMUSCULAR | Status: AC
  Filled 2016-04-04: qty 1500

## 2016-04-04 MED ORDER — LIDOCAINE HCL (PF) 1 % IJ SOLN: INTRAMUSCULAR | Status: DC | PRN

## 2016-04-04 MED ORDER — ACETAMINOPHEN 325 MG PO TABS: 650.0000 mg | ORAL_TABLET | ORAL | Status: DC | PRN

## 2016-04-04 SURGICAL SUPPLY — 13 items
CATH INFINITI 5 FR JL3.5 (CATHETERS) ×3 IMPLANT
CATH INFINITI 5FR ANG PIGTAIL (CATHETERS) ×3 IMPLANT
CATH INFINITI JR4 5F (CATHETERS) ×3 IMPLANT
CATH SWAN GANZ 7F STRAIGHT (CATHETERS) ×3 IMPLANT
DEVICE RAD COMP TR BAND LRG (VASCULAR PRODUCTS) ×3 IMPLANT
GLIDESHEATH SLEND SS 6F .021 (SHEATH) ×3 IMPLANT
KIT HEART LEFT (KITS) ×3 IMPLANT
KIT HEART RIGHT NAMIC (KITS) ×3 IMPLANT
PACK CARDIAC CATHETERIZATION (CUSTOM PROCEDURE TRAY) ×3 IMPLANT
SHEATH PINNACLE 7F 10CM (SHEATH) ×3 IMPLANT
TRANSDUCER W/STOPCOCK (MISCELLANEOUS) ×3 IMPLANT
TUBING CIL FLEX 10 FLL-RA (TUBING) ×3 IMPLANT
WIRE SAFE-T 1.5MM-J .035X260CM (WIRE) ×3 IMPLANT

## 2016-04-04 NOTE — H&P (View-Only) (Signed)
  Advanced Heart Failure Rounding Note   Subjective:    Caleb Barnes 60 y.o. male with h/o tobacco abuse up until 2014 and ETOH abuse. He presented to [facility] ED on 4/3 with SOB. Had elevated BNP and LFTs. He was found to be in atrial flutter with RVR with 2:1 conduction. CXR concerning for edema. ECHO with EF 20-25%. GI consulted due to elevated LFTs. Abd US with gall bladder thickening but normal-appearing liver.  CTA chest with no PE.   Started on amio drip and diuresed with 40 mg IV lasix. Yesterday he was had limited urine output and was started dopamine and dobutamine. Due to worsening condition [facility]. Dopamine stopped last night.   Denies SOB/CP.  He is back in NSR this morning and feels much better.    Objective:   Weight Range:  Vital Signs:   Temp:  [97.8 F (36.6 C)] 97.8 F (36.6 C) (04/06 0400) Pulse Rate:  [69-101] 83 (04/06 0600) Resp:  [16-22] 19 (04/06 0600) BP: (95-132)/(67-86) 106/79 mmHg (04/06 0600) SpO2:  [89 %-100 %] 96 % (04/06 0600) Weight:  [154 lb 12.2 oz (70.2 kg)-156 lb 4.9 oz (70.9 kg)] 154 lb 12.2 oz (70.2 kg) (04/06 0400) Last BM Date: 03/31/16  Weight change: Filed Weights   04/02/16 1800 04/03/16 0400  Weight: 156 lb 4.9 oz (70.9 kg) 154 lb 12.2 oz (70.2 kg)    Intake/Output:   Intake/Output Summary (Last 24 hours) at 04/03/16 0719 Last data filed at 04/03/16 0600  Gross per 24 hour  Intake 1182.11 ml  Output   2700 ml  Net -1517.89 ml     Physical Exam: General:  Well appearing. No resp difficulty. In bed  HEENT: normal. [facility]  Neck: supple. JVP 5-6  . Carotids 2+ bilat; no bruits. No lymphadenopathy or thryomegaly appreciated. Cor: PMI nondisplaced. Regular rate & rhythm. No rubs, gallops or murmurs. Lungs: clear Abdomen: soft, nontender, nondistended. No hepatosplenomegaly. No bruits or masses. Good bowel sounds. Extremities: no cyanosis, clubbing, rash, edema R and LLE warm  Neuro: alert & orientedx3,  cranial nerves grossly intact. moves all 4 extremities w/o difficulty. Affect pleasant  Telemetry: Sinus Tach   Labs: Basic Metabolic Panel:  Recent Labs Lab 03/31/16 0658 03/31/16 1438 04/01/16 0447 04/02/16 0446 04/03/16 0500  NA 132*  --  127* 133* 137  K 3.5  --  4.4 3.2* 3.4*  CL 99*  --  93* 95* 93*  CO2 23  --  22 31 32  GLUCOSE 146*  --  147* 104* 104*  BUN 19  --  39* 51* 30*  CREATININE 1.12  --  2.20* 2.00* 1.49*  CALCIUM 8.9  --  8.5* 8.4* 8.9  MG <0.1* 2.9* 2.7* 2.5* 2.0  PHOS  --   --  5.8*  --   --     Liver Function Tests:  Recent Labs Lab 03/31/16 0658 04/01/16 0447 04/02/16 0446 04/03/16 0500  AST 785* >2275* 5483* 1763*  ALT 828* 5950* 4627* 3216*  ALKPHOS 81 76 101 91  BILITOT 2.9* 3.4* 3.2* 3.1*  PROT 7.4 6.0* 6.1* 6.0*  ALBUMIN 4.1 3.5 3.3* 3.1*   No results for input(s): LIPASE, AMYLASE in the last 168 hours. No results for input(s): AMMONIA in the last 168 hours.  CBC:  Recent Labs Lab 03/31/16 0658 04/01/16 0447 04/02/16 0446 04/03/16 0500  WBC 11.4* 21.8* 13.5* 9.1  NEUTROABS  --   --   --    7.4  HGB 13.4 13.2 14.0 14.4  HCT 40.4 38.9* 41.8 44.0  MCV 98.0 98.1 97.7 97.8  PLT 276 211 181 185    Cardiac Enzymes:  Recent Labs Lab 03/31/16 0658 03/31/16 1245 03/31/16 1739  TROPONINI 0.17* 0.20* 0.19*    BNP: BNP (last 3 results)  Recent Labs  03/31/16 0658 04/03/16 0500  BNP 1693.0* 887.2*    ProBNP (last 3 results) No results for input(s): PROBNP in the last 8760 hours.    Other results:  Imaging: Us Art/ven Flow Abd Pelv Doppler Limited  04/01/2016  CLINICAL DATA:  Elevated LFTs. EXAM: DUPLEX ULTRASOUND OF LIVER TECHNIQUE: Color and duplex Doppler ultrasound was performed to evaluate the hepatic in-flow and out-flow vessels. COMPARISON:  Ultrasound and CT from the previous day FINDINGS: Portal Vein 1.3 cm diameter. No evidence of occlusion or thrombus. Velocities (all hepatopetal): Main:  8-17 cm/sec Right:   16 cm/sec Left:  10 cm/sec Hepatic Vein Velocities (all hepatofugal): Right:  53 cm/sec Middle:  24 cm/sec Left:  19 cm/sec Intrahepatic IVC is patent, velocity 33 cm/second. Hepatic Artery Velocity:  61 cm/sec Spleen 9.3 x 10 x 3.7 cm. Splenic Vein shows no evidence of occlusion or thrombus. Velocity: 13 cm/sec Varices: None seen Ascites: Trace perisplenic and perihepatic IMPRESSION: 1. Normal liver vascular assessment. 2. Trace abdominal ascites. Electronically Signed   By: D  Hassell M.D.   On: 04/01/2016 12:19   Dg Chest Port 1 View  04/01/2016  CLINICAL DATA:  Evaluate central line placement EXAM: PORTABLE CHEST 1 VIEW COMPARISON:  03/31/2016 FINDINGS: Left Central line is in place with the tip at the cavoatrial junction. No pneumothorax. Cardiomegaly. Mild vascular congestion. No confluent airspace opacities, effusions or overt edema. No acute bony abnormality. IMPRESSION: Cardiomegaly, vascular congestion. Electronically Signed   By: Kevin  Dover M.D.   On: 04/01/2016 10:39      Medications:     Scheduled Medications: . ALPRAZolam  1 mg Oral TID  . aspirin EC  81 mg Oral Daily  . folic acid  1 mg Oral Daily  . furosemide  40 mg Intravenous Q12H  . lactulose  10 g Oral BID  . sodium chloride flush  3 mL Intravenous Q12H  . thiamine  100 mg Oral Daily  . tiotropium  18 mcg Inhalation Daily     Infusions: . amiodarone 30 mg/hr (04/03/16 0000)  . DOBUTamine 2.5 mcg/kg/min (04/02/16 2000)  . heparin 1,600 Units/hr (04/02/16 2035)     PRN Medications:  sodium chloride, acetaminophen, ondansetron (ZOFRAN) IV, sodium chloride flush     Assessment/Plan/Discussion:    1. New Acute Systolic Heart Failure - ? Tachy mediated ? ETOH induced . Has not had ischemic work up but has had episodes for chest tightness.  Troponin 0.17>0.19>0.2.Plan for LHC/RHC in am if creatinine remains stable.  ECHO EF 20-25%. He is not decompensated. Volume status does not appear elevated. Hold lasix for  now. Set up CVP.  Stop dobutamine. Check CO-OX  If less than 55% can restart dobutamine but would like to establish a baseline. Hold off on bb.  Can start spironolactone at low dose today, hold off on ACEI with creatinine still elevated and recent AKI.  2. New Onset A Flutter RVR  -On Heparin and Amiodarone drip. Continue to load on amio drip. Converted to sinus rhythm last night.  3. Elevated LFTs-GI consulted at [facility]. US abdomen showed unremarkable liver, trace ascites. LFTs coming down => suspect shock liver.   4. NSVT- K   3.4. Supplement K.   5. AKI- Creatinine 1.1 but peaked 2.2. ? Diuresis versus low output.  Today creatinine is down to 1.49. Hold off adding Ace/Spiro with plan for LHC tomorrow.  6. ETOH- Was on CIWA protocol at [facility]. Ok today. H/O 2-3 beers per day and 12 beers daily on the weekend.  7.  Former Smoker- Quit 2 years ago   Consult cardiac rehab.   Length of Stay: 1   Amy Clegg NP-C  04/03/2016, 7:19 AM  Advanced Heart Failure Team Pager [phone] (M-F; 7a - 4p)  Please contact [facility] for night-coverage after hours (4p -7a ) and weekends on [email or web address]  Patient seen with NP, agree with the above note.  He is back in NSR this morning.  LFTs coming down.   1. Acute systolic CHF: ?etiology.  Possibilities include tachy-mediated CMP with atrial fib/RVR of unknown duration and ETOH CMP.  Cannot rule out CAD.  CVP 3 this morning with co-ox 61% (had been off dobutamine for about 30 minutes).  - Can hold Lasix today, probably start po tomorrow.  - Add digoxin and low dose spironolactone today.   - Possible low dose ACEI tomorrow if creatinine continues to come down.  - LHC/RHC tomorrow to assess for coronary disease and assess cardiac output.  Discussed risks/benefits with patient, he agrees to proceed.  2. Elevated LFTs: Suspect shock liver.  Abdominal US ok at [facility].  Coming down.  Can continue amiodarone. 3. Atrial fibrillation: Paroxysmal, now back in NSR on  amiodarone.  Uncertain duration.  Continue heparin gtt, switch to NOAC post-cath.  4. ETOH abuse: Counseled to quit.  5. AKI: In setting of suspected cardiogenic shock.  Now resolving.  45 minutes critical care time.   Darcus Edds 04/03/2016 9:08 AM    

## 2016-04-04 NOTE — Progress Notes (Addendum)
   04/04/16 1828  What Happened  Was fall witnessed? No  Was patient injured? No  Patient found on floor  Found by Staff-comment Dayna Barker, RN)  Stated prior activity other (comment) (Trying to reach something on floor)  Follow Up  MD notified Azalee Course, PA  Time MD notified [phone]  Family notified No- patient refusal  Additional tests No  Progress note created (see row info) Yes  Adult Fall Risk Assessment  Risk Factor Category (scoring not indicated) Fall has occurred during this admission (document High fall risk)  Patient's Fall Risk High Fall Risk (>13 points)  Adult Fall Risk Interventions  Required Bundle Interventions *See Row Information* High fall risk - low, moderate, and high requirements implemented  Additional Interventions Fall risk signage;Individualized elimination schedule;Secure all tubes/drains  Fall with Injury Screening  Risk For Fall Injury- See Row Information  Nurse judgement  Intervention(s) for 2 or more risk criteria identified Gait Belt  Vitals  BP 107/74 mmHg  MAP (mmHg) 84  Pulse Rate 89  ECG Heart Rate 90  Resp (!) 21  Oxygen Therapy  SpO2 97 %  O2 Device Room Air  Pain Assessment  Pain Assessment No/denies pain  Neurological  Neuro (WDL) WDL  Level of Consciousness Alert  Orientation Level Oriented X4  Cognition Follows commands;Impulsive  Speech Clear  Pupil Assessment  Yes  R Pupil Size (mm) 3  R Pupil Shape Round  R Pupil Reaction Brisk  L Pupil Size (mm) 3  L Pupil Shape Round  L Pupil Reaction Brisk  R Hand Grip Moderate;Present  L Hand Grip Present;Moderate   R Foot Dorsiflexion Present;Strong  L Foot Dorsiflexion Present;Strong  R Foot Plantar Flexion Present;Strong  L Foot Plantar Flexion Present;Strong  RUE Motor Response Purposeful movement;Responds to commands  RUE Motor Strength 4  LUE Motor Response Purposeful movement;Responds to commands  LUE Motor Strength 4  RLE Motor Response Purposeful movement;Responds to  commands  RLE Motor Strength 5  LLE Motor Response Purposeful movement;Responds to commands  LLE Motor Strength 5  Neuro Symptoms None  Glasgow Coma Scale  Eye Opening 4  Best Verbal Response (NON-intubated) 5  Best Motor Response 6  Glasgow Coma Scale Score 15  Musculoskeletal  Musculoskeletal (WDL) WDL  Assistive Device None  Integumentary  Integumentary (WDL) WDL    Got low SPO2 alert on phone. Went to check on pt and found patient lying on floor.  Pt stated he was trying to reach a pinto bean he had dropped on floor and slid off bed to his knees.  Pt denies hitting head.  Pt denies pain.  Assisted pt back to bed.  VSS.  No abrasions noted. Pt doesn't remember which knee hit floor first, but denies any pain.  PA [employer] notified.  Pt refused for family to be notified.  Reviewed call bell again with pt, who agreed to call before getting up.  Bed alarm on.  Will continue to monitor.

## 2016-04-04 NOTE — Progress Notes (Signed)
Pt heart rhythm in and out of afib.  12 lead ekg showed QTC 0.59.  Dr. Gala Romney notified.  Will continue with amiodarone drip as ordered.  Other VS stable.  Pt asymptomatic.  Will continue to monitor.

## 2016-04-04 NOTE — Progress Notes (Signed)
CARDIAC REHAB PHASE I   PRE:  Rate/Rhythm: 85 SR  BP:  Supine: 108/80  Sitting:   Standing:    SaO2: 92%RA  MODE:  Ambulation: 220 ft   POST:  Rate/Rhythm: 124 ? Beats VT vs. Run of atrial flutter/fib..    Irregular during walk and sitting in chair. RN aware. Pt asymptomatic  BP:  Supine:   Sitting: 100/78  Standing:    SaO2: 97%RA 1407-1444 Pt very sleepy. Woke up to walk. Pt walked 220 ft on RA with gait belt use and asst x 1. Fairly steady gait. Stated he was a little lightheaded during walk. To recliner after walk. Heart rhythm different during and after walk than before as it is more irregular. Pt asymptomatic sitting in chair. RN aware.   Luetta Nutting, RN BSN  04/04/2016 2:39 PM

## 2016-04-04 NOTE — Interval H&P Note (Signed)
History and Physical Interval Note:  04/04/2016 7:45 AM  Caleb Barnes  has presented today for surgery, with the diagnosis of hf  The various methods of treatment have been discussed with the patient and family. After consideration of risks, benefits and other options for treatment, the patient has consented to  Procedure(s): Right/Left Heart Cath and Coronary Angiography (N/A) as a surgical intervention .  The patient's history has been reviewed, patient examined, no change in status, stable for surgery.  I have reviewed the patient's chart and labs.  Questions were answered to the patient's satisfaction.     Dalton [employer]

## 2016-04-04 NOTE — Progress Notes (Signed)
Patient ID: Caleb Barnes, male   DOB: [birth year], 60 y.o.   MRN: [id number]    Advanced Heart Failure Rounding Note   Subjective:    60 y.o. male with h/o tobacco abuse up until 2014 and ETOH abuse. He presented to [facility] ED on 4/3 with SOB. Had elevated BNP and LFTs. He was found to be in atrial flutter with RVR with 2:1 conduction. CXR concerning for edema. ECHO with EF 20-25%. GI consulted due to elevated LFTs. Abd [place] with gall bladder thickening but normal-appearing liver.  CTA chest with no PE.   Started on amio drip and diuresed with 40 mg IV lasix at [facility].  He had limited urine output and was started on dopamine and dobutamine. Due to worsening condition he was transferred to [facility] for HF management. He was titrated off both meds in the CCU and went back into NSR.     He is in NSR today.  He remains on amiodarone gtt.  No complaints, breathing better.   RHC/LHC today: Left Main  No significant disease.      Left Anterior Descending  Luminal irregularities, 30% mid LAD stenosis.     Ramus Intermedius  No significant disease.     Left Circumflex  Luminal irregularities.     Right Coronary Artery  Luminal irregularities.       Right Heart Pressures RHC Procedural Findings: Hemodynamics (mmHg) RA mean 7 RV 43/9 PA 43/25, mean 32 PCWP mean 21 LV 83/20 AO 84/58  Oxygen saturations: PA 46% AO 95%  Cardiac Output (Fick) 2.67  Cardiac Index (Fick) 1.41  Cardiac Output (Thermo) 3.38 Cardiac Index (Thermo) 1.78     Objective:   Weight Range:  Vital Signs:   Temp:  [98 F (36.7 C)-98.5 F (36.9 C)] 98.3 F (36.8 C) (04/07 0400) Pulse Rate:  [83-103] 83 (04/07 0839) Resp:  [7-26] 18 (04/07 0839) BP: (84-120)/(60-89) 107/70 mmHg (04/07 0839) SpO2:  [88 %-100 %] 95 % (04/07 0839) Weight:  [157 lb 4.8 oz (71.351 kg)] 157 lb 4.8 oz (71.351 kg) (04/07 0300) Last BM Date: 04/03/16  Weight change: Filed Weights   04/02/16 1800 04/03/16 0400 04/04/16 0300    Weight: 156 lb 4.9 oz (70.9 kg) 154 lb 12.2 oz (70.2 kg) 157 lb 4.8 oz (71.351 kg)    Intake/Output:   Intake/Output Summary (Last 24 hours) at 04/04/16 0850 Last data filed at 04/04/16 0700  Gross per 24 hour  Intake 1436.7 ml  Output   2250 ml  Net -813.3 ml     Physical Exam: General:  Well appearing. No resp difficulty. In bed  HEENT: normal. LIJ  Neck: supple. JVP 8. Carotids 2+ bilat; no bruits. No lymphadenopathy or thryomegaly appreciated. Cor: PMI nondisplaced. Regular rate & rhythm. No rubs, gallops or murmurs. Lungs: clear Abdomen: soft, nontender, nondistended. No hepatosplenomegaly. No bruits or masses. Good bowel sounds. Extremities: no cyanosis, clubbing, rash, edema R and LLE warm  Neuro: alert & orientedx3, cranial nerves grossly intact. moves all 4 extremities w/o difficulty. Affect pleasant  Telemetry: NSR in 90s-100s   Labs: Basic Metabolic Panel:  Recent Labs Lab 03/31/16 0658 03/31/16 1438 04/01/16 0447 04/02/16 0446 04/03/16 0500 04/04/16 0300  NA 132*  --  127* 133* 137 133*  K 3.5  --  4.4 3.2* 3.4* 3.9  CL 99*  --  93* 95* 93* 95*  CO2 23  --  22 31 32 27  GLUCOSE 146*  --  147* 104* 104* 105*  BUN  19  --  39* 51* 30* 21*  CREATININE 1.12  --  2.20* 2.00* 1.49* 1.32*  CALCIUM 8.9  --  8.5* 8.4* 8.9 8.4*  MG <0.1* 2.9* 2.7* 2.5* 2.0  --   PHOS  --   --  5.8*  --   --   --     Liver Function Tests:  Recent Labs Lab 03/31/16 0658 04/01/16 0447 04/02/16 0446 04/03/16 0500  AST 785* >2275* 5483* 1763*  ALT 828* 5950* 4627* 3216*  ALKPHOS 81 76 101 91  BILITOT 2.9* 3.4* 3.2* 3.1*  PROT 7.4 6.0* 6.1* 6.0*  ALBUMIN 4.1 3.5 3.3* 3.1*   No results for input(s): LIPASE, AMYLASE in the last 168 hours. No results for input(s): AMMONIA in the last 168 hours.  CBC:  Recent Labs Lab 03/31/16 0658 04/01/16 0447 04/02/16 0446 04/03/16 0500 04/04/16 0300  WBC 11.4* 21.8* 13.5* 9.1 8.5  NEUTROABS  --   --   --  7.4  --   HGB 13.4  13.2 14.0 14.4 14.2  HCT 40.4 38.9* 41.8 44.0 43.3  MCV 98.0 98.1 97.7 97.8 97.5  PLT 276 211 181 185 177    Cardiac Enzymes:  Recent Labs Lab 03/31/16 0658 03/31/16 1245 03/31/16 1739  TROPONINI 0.17* 0.20* 0.19*    BNP: BNP (last 3 results)  Recent Labs  03/31/16 0658 04/03/16 0500  BNP 1693.0* 887.2*    ProBNP (last 3 results) No results for input(s): PROBNP in the last 8760 hours.    Other results:  Imaging: No results found.   Medications:     Scheduled Medications: . [MAR Hold] ALPRAZolam  1 mg Oral TID  . [MAR Hold] aspirin EC  81 mg Oral Daily  . [MAR Hold] digoxin  0.125 mg Oral Daily  . [MAR Hold] folic acid  1 mg Oral Daily  . furosemide  40 mg Intravenous Once  . [START ON 04/05/2016] furosemide  40 mg Oral Daily  . [MAR Hold] lactulose  10 g Oral BID  . potassium chloride  40 mEq Oral Once  . [MAR Hold] sodium chloride flush  3 mL Intravenous Q12H  . sodium chloride flush  3 mL Intravenous Q12H  . [MAR Hold] spironolactone  12.5 mg Oral Daily  . [MAR Hold] thiamine  100 mg Oral Daily  . [MAR Hold] tiotropium  18 mcg Inhalation Daily    Infusions: . [START ON 04/05/2016] sodium chloride    . amiodarone 30 mg/hr (04/03/16 2141)  . milrinone      PRN Medications: [MAR Hold] sodium chloride, sodium chloride, [MAR Hold] acetaminophen, fentaNYL, cath procedure set-up drugs (heparinized saline/lidocaine/nitro), heparin, iopamidol, lidocaine (PF), midazolam, [MAR Hold] ondansetron (ZOFRAN) IV, Radial Cocktail/Verapamil only, [MAR Hold] sodium chloride flush, sodium chloride flush     Assessment/Plan/Discussion:    60 yo heavy drinker presented to [facility] in afib/RVR and acute systolic CHF with cardiogenic shock.  Initially on dopamine and dobutamine at [facility] titrated off at [facility].   1. Acute systolic CHF: ?etiology.  Possibilities include tachy-mediated CMP with atrial fib/RVR of unknown duration and ETOH CMP.  No CAD on coronary angiography today.   RHC today showed mildly elevated left and right heart filling pressures and low cardiac output (off inotropes).    - I will put him on milrinone 0.25 mcg/kg/min, will work on titrating off gradually over the next few days.  - Started on digoxin and low dose spironolactone.  BP soft for ACEI, avoid beta blocker with low output.    -  Continue to follow CVP/co-ox over central line.  - If tachy-mediated CMP, hopefully will get better soon.  Given ETOH history, may be poor candidate for advanced therapies.  2. Elevated LFTs: Suspect shock liver.  Abdominal US ok at [facility].  Coming down.  Can continue amiodarone.  Repeat CMET in am.  3. Atrial fibrillation: Paroxysmal, now back in NSR on amiodarone.  Uncertain duration.  Continue amiodarone gtt while on milrinone, can start Eliquis this evening.  4. ETOH abuse: Counseled to quit.  5. AKI: In setting of suspected cardiogenic shock.  Now resolving.  Used minimal contrast with cath today.   [name] 04/04/2016 8:50 AM

## 2016-04-05 ENCOUNTER — Encounter (HOSPITAL_COMMUNITY): Payer: Self-pay | Admitting: *Deleted

## 2016-04-05 LAB — COMPREHENSIVE METABOLIC PANEL
ALT: 1616 U/L — ABNORMAL HIGH (ref 17–63)
AST: 351 U/L — ABNORMAL HIGH (ref 15–41)
Albumin: 2.8 g/dL — ABNORMAL LOW (ref 3.5–5.0)
Alkaline Phosphatase: 84 U/L (ref 38–126)
Anion gap: 9 (ref 5–15)
BUN: 18 mg/dL (ref 6–20)
CO2: 28 mmol/L (ref 22–32)
Calcium: 8.5 mg/dL — ABNORMAL LOW (ref 8.9–10.3)
Chloride: 97 mmol/L — ABNORMAL LOW (ref 101–111)
Creatinine, Ser: 1.3 mg/dL — ABNORMAL HIGH (ref 0.61–1.24)
GFR calc Af Amer: >60 mL/min (ref >60)
GFR calc non Af Amer: 59 mL/min — ABNORMAL LOW (ref >60)
Glucose, Bld: 146 mg/dL — ABNORMAL HIGH (ref 65–99)
Potassium: 3.8 mmol/L (ref 3.5–5.1)
Sodium: 134 mmol/L — ABNORMAL LOW (ref 135–145)
Total Bilirubin: 2 mg/dL — ABNORMAL HIGH (ref 0.3–1.2)
Total Protein: 5.8 g/dL — ABNORMAL LOW (ref 6.5–8.1)

## 2016-04-05 LAB — CBC
HCT: 43.1 % (ref 39.0–52.0)
Hemoglobin: 14.3 g/dL (ref 13.0–17.0)
MCH: 32.5 pg (ref 26.0–34.0)
MCHC: 33.2 g/dL (ref 30.0–36.0)
MCV: 98 fL (ref 78.0–100.0)
Platelets: 166 10*3/uL (ref 150–400)
RBC: 4.4 MIL/uL (ref 4.22–5.81)
RDW: 14.9 % (ref 11.5–15.5)
WBC: 6.3 10*3/uL (ref 4.0–10.5)

## 2016-04-05 LAB — CARBOXYHEMOGLOBIN
Carboxyhemoglobin: 1.3 % (ref 0.5–1.5)
Methemoglobin: 0.7 % (ref 0.0–1.5)
O2 Saturation: 65.6 %
Total hemoglobin: 14.5 g/dL (ref 13.5–18.0)

## 2016-04-05 LAB — GLUCOSE, CAPILLARY: Glucose-Capillary: 156 mg/dL — ABNORMAL HIGH (ref 65–99)

## 2016-04-05 MED ORDER — SODIUM CHLORIDE 0.9% FLUSH: 10.0000 mL | INTRAVENOUS | Status: DC | PRN

## 2016-04-05 MED ORDER — SODIUM CHLORIDE 0.9% FLUSH
10.0000 mL | Freq: Two times a day (BID) | INTRAVENOUS | Status: DC
  Administered 2016-04-05 – 2016-04-06 (×3): 10 mL
  Administered 2016-04-07: 20 mL
  Administered 2016-04-07: 10 mL

## 2016-04-05 MED ORDER — AMIODARONE HCL 200 MG PO TABS
200.0000 mg | ORAL_TABLET | Freq: Two times a day (BID) | ORAL | Status: DC
  Administered 2016-04-05 – 2016-04-08 (×7): 200 mg via ORAL
  Filled 2016-04-05 (×7): qty 1

## 2016-04-05 NOTE — Progress Notes (Signed)
Transferred-in from 2H Room10, Awake and alert.

## 2016-04-05 NOTE — Progress Notes (Signed)
Patient ID: Caleb Barnes, male   DOB: [birth year], 60 y.o.   MRN: [id number]    Advanced Heart Failure Rounding Note   Subjective:    60 y.o. male with h/o tobacco abuse up until 2014 and ETOH abuse. He presented to [facility] ED on 4/3 with SOB. Had elevated BNP and LFTs. He was found to be in atrial flutter with RVR with 2:1 conduction. CXR concerning for edema. ECHO with EF 20-25%. GI consulted due to elevated LFTs. Abd [place] with gall bladder thickening but normal-appearing liver.  CTA chest with no PE.   Started on amio drip and diuresed with 40 mg IV lasix at [facility].  He had limited urine output and was started on dopamine and dobutamine. Due to worsening condition he was transferred to [facility] for HF management. He was titrated off both meds in the CCU and went back into NSR.     L/RHC yesterday. Normal cors.  RHC  showed mildly elevated left and right heart filling pressures and low cardiac output (off inotropes).    Started on milrinone yesterday. Co-ox 65%. CVP 2-3  He remains in NSR. No complaints, breathing better. Wants to go home.  RHC/LHC today: Left Main  No significant disease.      Left Anterior Descending  Luminal irregularities, 30% mid LAD stenosis.     Ramus Intermedius  No significant disease.     Left Circumflex  Luminal irregularities.     Right Coronary Artery  Luminal irregularities.       Right Heart Pressures RHC Procedural Findings: Hemodynamics (mmHg) RA mean 7 RV 43/9 PA 43/25, mean 32 PCWP mean 21 LV 83/20 AO 84/58  Oxygen saturations: PA 46% AO 95%  Cardiac Output (Fick) 2.67  Cardiac Index (Fick) 1.41  Cardiac Output (Thermo) 3.38 Cardiac Index (Thermo) 1.78     Objective:   Weight Range:  Vital Signs:   Temp:  [97.4 F (36.3 C)-97.8 F (36.6 C)] 97.8 F (36.6 C) (04/08 0740) Pulse Rate:  [59-90] 81 (04/08 1000) Resp:  [15-26] 19 (04/08 1000) BP: (94-115)/(65-84) 109/73 mmHg (04/08 1000) SpO2:  [92 %-100 %] 95 % (04/08  1000) Weight:  [69.627 kg (153 lb 8 oz)] 69.627 kg (153 lb 8 oz) (04/08 0447) Last BM Date: 04/03/16  Weight change: Filed Weights   04/03/16 0400 04/04/16 0300 04/05/16 0447  Weight: 70.2 kg (154 lb 12.2 oz) 71.351 kg (157 lb 4.8 oz) 69.627 kg (153 lb 8 oz)    Intake/Output:   Intake/Output Summary (Last 24 hours) at 04/05/16 1258 Last data filed at 04/05/16 1000  Gross per 24 hour  Intake  606.2 ml  Output   1625 ml  Net -1018.8 ml     Physical Exam: General:  Well appearing. No resp difficulty. In bed  HEENT: normal. LIJ  Neck: supple. JVP 3 Carotids 2+ bilat; no bruits. No lymphadenopathy or thryomegaly appreciated. Cor: PMI nondisplaced. Regular rate & rhythm. No rubs, gallops or murmurs. Lungs: clear Abdomen: soft, nontender, nondistended. No hepatosplenomegaly. No bruits or masses. Good bowel sounds. Extremities: no cyanosis, clubbing, rash, edema R and LLE warm  Neuro: alert & orientedx3, cranial nerves grossly intact. moves all 4 extremities w/o difficulty. Affect pleasant  Telemetry: NSR in 90s-100s   Labs: Basic Metabolic Panel:  Recent Labs Lab 03/31/16 0658 03/31/16 1438 04/01/16 0447 04/02/16 0446 04/03/16 0500 04/04/16 0300 04/05/16 0445  NA 132*  --  127* 133* 137 133* 134*  K 3.5  --  4.4 3.2* 3.4* 3.9  3.8  CL 99*  --  93* 95* 93* 95* 97*  CO2 23  --  22 31 32 27 28  GLUCOSE 146*  --  147* 104* 104* 105* 146*  BUN 19  --  39* 51* 30* 21* 18  CREATININE 1.12  --  2.20* 2.00* 1.49* 1.32* 1.30*  CALCIUM 8.9  --  8.5* 8.4* 8.9 8.4* 8.5*  MG <0.1* 2.9* 2.7* 2.5* 2.0  --   --   PHOS  --   --  5.8*  --   --   --   --     Liver Function Tests:  Recent Labs Lab 03/31/16 0658 04/01/16 0447 04/02/16 0446 04/03/16 0500 04/05/16 0445  AST 785* >2275* 5483* 1763* 351*  ALT 828* 5950* 4627* 3216* 1616*  ALKPHOS 81 76 101 91 84  BILITOT 2.9* 3.4* 3.2* 3.1* 2.0*  PROT 7.4 6.0* 6.1* 6.0* 5.8*  ALBUMIN 4.1 3.5 3.3* 3.1* 2.8*   No results for  input(s): LIPASE, AMYLASE in the last 168 hours. No results for input(s): AMMONIA in the last 168 hours.  CBC:  Recent Labs Lab 04/01/16 0447 04/02/16 0446 04/03/16 0500 04/04/16 0300 04/05/16 0445  WBC 21.8* 13.5* 9.1 8.5 6.3  NEUTROABS  --   --  7.4  --   --   HGB 13.2 14.0 14.4 14.2 14.3  HCT 38.9* 41.8 44.0 43.3 43.1  MCV 98.1 97.7 97.8 97.5 98.0  PLT 211 181 185 177 166    Cardiac Enzymes:  Recent Labs Lab 03/31/16 0658 03/31/16 1245 03/31/16 1739  TROPONINI 0.17* 0.20* 0.19*    BNP: BNP (last 3 results)  Recent Labs  03/31/16 0658 04/03/16 0500  BNP 1693.0* 887.2*    ProBNP (last 3 results) No results for input(s): PROBNP in the last 8760 hours.    Other results:  Imaging: No results found.   Medications:     Scheduled Medications: . ALPRAZolam  1 mg Oral TID  . apixaban  5 mg Oral BID  . aspirin EC  81 mg Oral Daily  . digoxin  0.125 mg Oral Daily  . folic acid  1 mg Oral Daily  . furosemide  40 mg Oral Daily  . lactulose  10 g Oral BID  . sodium chloride flush  10-40 mL Intracatheter Q12H  . sodium chloride flush  3 mL Intravenous Q12H  . sodium chloride flush  3 mL Intravenous Q12H  . sodium chloride flush  3 mL Intravenous Q12H  . spironolactone  12.5 mg Oral Daily  . thiamine  100 mg Oral Daily  . tiotropium  18 mcg Inhalation Daily    Infusions: . amiodarone 30 mg/hr (04/05/16 1031)  . milrinone 0.25 mcg/kg/min (04/05/16 0300)    PRN Medications: sodium chloride, sodium chloride, sodium chloride, acetaminophen, ondansetron (ZOFRAN) IV, sodium chloride flush, sodium chloride flush, sodium chloride flush, sodium chloride flush     Assessment/Plan/Discussion:    60 yo heavy drinker presented to [facility] in afib/RVR and acute systolic CHF with cardiogenic shock.  Initially on dopamine and dobutamine at [facility], titrated off at [facility].   1. Acute systolic CHF: ?etiology.  Possibilities include tachy-mediated CMP with atrial fib/RVR of  unknown duration and ETOH CMP.  No CAD on coronary angiography 4/7.  - Co-ox improved on milrinone will cut to 0.125. CVP 3. Will continue lasix for now. May need to hold.  - Now on digoxin and low dose spironolactone. Add losartan 25 qhs as BP tolerates. Avoid beta blocker with  low output.    - Continue to follow CVP/co-ox over central line.  - If tachy-mediated CMP, hopefully will get better soon. Continue amio. Switch to 200 bid. Given ETOH history, may be poor candidate for advanced therapies.  2. Elevated LFTs: Suspect shock liver.  Abdominal US ok at [facility].  Coming down.  Can continue amiodarone.  3. Atrial fibrillation: Paroxysmal, now back in NSR on amiodarone.  Uncertain duration.  Switch amio to 200 bid. Continue Eliquis.  4. ETOH abuse: Counseled to quit.  5. AKI: In setting of suspected cardiogenic shock.  Now resolving.   6. Hyponatremia, mild  Can go to SDU.  Arvilla Meres 04/05/2016 12:58 PM

## 2016-04-05 NOTE — Progress Notes (Signed)
CARDIAC REHAB PHASE I   PRE:  Rate/Rhythm: 80 sinus rhythm  BP:  Supine:   Sitting: 113/79  Standing:    SaO2: 96% ra  MODE:  Ambulation: 350 ft   POST:  Rate/Rhythem: 87 sinus rhythm  BP:  Supine:    Sitting: 115/87   Standing:    SaO2: 99%  ra  1145-1225  Pt ambulated in hallway x1 assist using gait belt and rolling walker. Quick pace, veered to right. Pt returned to chair, call light in reach, family at bedside.   [employer]

## 2016-04-06 LAB — CBC
HCT: 44 % (ref 39.0–52.0)
Hemoglobin: 14.4 g/dL (ref 13.0–17.0)
MCH: 31.6 pg (ref 26.0–34.0)
MCHC: 32.7 g/dL (ref 30.0–36.0)
MCV: 96.5 fL (ref 78.0–100.0)
Platelets: 150 10*3/uL (ref 150–400)
RBC: 4.56 MIL/uL (ref 4.22–5.81)
RDW: 14.4 % (ref 11.5–15.5)
WBC: 7.9 10*3/uL (ref 4.0–10.5)

## 2016-04-06 LAB — COMPREHENSIVE METABOLIC PANEL
ALT: 1213 U/L — ABNORMAL HIGH (ref 17–63)
AST: 196 U/L — ABNORMAL HIGH (ref 15–41)
Albumin: 3 g/dL — ABNORMAL LOW (ref 3.5–5.0)
Alkaline Phosphatase: 90 U/L (ref 38–126)
Anion gap: 10 (ref 5–15)
BUN: 16 mg/dL (ref 6–20)
CO2: 27 mmol/L (ref 22–32)
Calcium: 8.7 mg/dL — ABNORMAL LOW (ref 8.9–10.3)
Chloride: 96 mmol/L — ABNORMAL LOW (ref 101–111)
Creatinine, Ser: 1.24 mg/dL (ref 0.61–1.24)
GFR calc Af Amer: >60 mL/min (ref >60)
GFR calc non Af Amer: >60 mL/min (ref >60)
Glucose, Bld: 120 mg/dL — ABNORMAL HIGH (ref 65–99)
Potassium: 4.1 mmol/L (ref 3.5–5.1)
Sodium: 133 mmol/L — ABNORMAL LOW (ref 135–145)
Total Bilirubin: 2.1 mg/dL — ABNORMAL HIGH (ref 0.3–1.2)
Total Protein: 6.2 g/dL — ABNORMAL LOW (ref 6.5–8.1)

## 2016-04-06 LAB — CARBOXYHEMOGLOBIN
Carboxyhemoglobin: 1.7 % — ABNORMAL HIGH (ref 0.5–1.5)
Methemoglobin: 0.6 % (ref 0.0–1.5)
O2 Saturation: 67.8 %
Total hemoglobin: 15.2 g/dL (ref 13.5–18.0)

## 2016-04-06 MED ORDER — LOSARTAN POTASSIUM 25 MG PO TABS
25.0000 mg | ORAL_TABLET | Freq: Every day | ORAL | Status: DC
  Administered 2016-04-06 – 2016-04-07 (×2): 25 mg via ORAL
  Filled 2016-04-06 (×2): qty 1

## 2016-04-06 NOTE — Progress Notes (Addendum)
Patient ID: Caleb Barnes, male   DOB: March 25, 1956, 60 y.o.   MRN: [id number]    Advanced Heart Failure Rounding Note   Subjective:    59 y.o. male with h/o tobacco abuse up until 2014 and ETOH abuse. He presented to [facility] ED on 4/3 with SOB. Had elevated BNP and LFTs. He was found to be in atrial flutter with RVR with 2:1 conduction. CXR concerning for edema. ECHO with EF 20-25%. GI consulted due to elevated LFTs. Abd [place] with gall bladder thickening but normal-appearing liver.  CTA chest with no PE.   Started on amio drip and diuresed with 40 mg IV lasix at [facility].  He had limited urine output and was started on dopamine and dobutamine. Due to worsening condition he was transferred to [facility] for HF management. He was titrated off both meds in the CCU and went back into NSR.     [name] 4/7. Normal cors.  RHC  showed mildly elevated left and right heart filling pressures and low cardiac output (off inotropes).    Started on milrinone 4/7.   Milrinone turned down to 0.125 on 4/8. Feels well. He remains in NSR. No complaints, breathing better. Ambulating hall. Co-ox 68%. CVP 3.  Renal function stable.      RHC/LHC 4/7 Left Main  No significant disease.      Left Anterior Descending  Luminal irregularities, 30% mid LAD stenosis.     Ramus Intermedius  No significant disease.     Left Circumflex  Luminal irregularities.     Right Coronary Artery  Luminal irregularities.       Right Heart Pressures RHC Procedural Findings: Hemodynamics (mmHg) RA mean 7 RV 43/9 PA 43/25, mean 32 PCWP mean 21 LV 83/20 AO 84/58  Oxygen saturations: PA 46% AO 95%  Cardiac Output (Fick) 2.67  Cardiac Index (Fick) 1.41  Cardiac Output (Thermo) 3.38 Cardiac Index (Thermo) 1.78     Objective:   Weight Range:  Vital Signs:   Temp:  [97.6 F (36.4 C)-98 F (36.7 C)] 97.8 F (36.6 C) (04/09 1128) Pulse Rate:  [81-88] 82 (04/09 1128) Resp:  [16-20] 18 (04/09 1128) BP: (94-120)/(52-89)  113/84 mmHg (04/09 1128) SpO2:  [97 %-100 %] 98 % (04/09 1128) Weight:  [69.4 kg (153 lb)] 69.4 kg (153 lb) (04/09 0653) Last BM Date: 04/03/16  Weight change: Filed Weights   04/04/16 0300 04/05/16 0447 04/06/16 0653  Weight: 71.351 kg (157 lb 4.8 oz) 69.627 kg (153 lb 8 oz) 69.4 kg (153 lb)    Intake/Output:   Intake/Output Summary (Last 24 hours) at 04/06/16 1426 Last data filed at 04/06/16 1100  Gross per 24 hour  Intake  256.7 ml  Output   2600 ml  Net -2343.3 ml     Physical Exam: General:  Well appearing. No resp difficulty. In bed  HEENT: normal. LIJ  Neck: supple. JVP 3 Carotids 2+ bilat; no bruits. No lymphadenopathy or thryomegaly appreciated. Cor: PMI nondisplaced. Regular rate & rhythm. No rubs, gallops or murmurs. Lungs: clear Abdomen: soft, nontender, nondistended. No hepatosplenomegaly. No bruits or masses. Good bowel sounds. Extremities: no cyanosis, clubbing, rash, edema R and LLE warm  Neuro: alert & orientedx3, cranial nerves grossly intact. moves all 4 extremities w/o difficulty. Affect pleasant  Telemetry: NSR in 90s   Labs: Basic Metabolic Panel:  Recent Labs Lab 03/31/16 0658 03/31/16 1438 04/01/16 0447 04/02/16 0446 04/03/16 0500 04/04/16 0300 04/05/16 0445 04/06/16 0536  NA 132*  --  127* 133* 137  133* 134* 133*  K 3.5  --  4.4 3.2* 3.4* 3.9 3.8 4.1  CL 99*  --  93* 95* 93* 95* 97* 96*  CO2 23  --  22 31 32 27 28 27   GLUCOSE 146*  --  147* 104* 104* 105* 146* 120*  BUN 19  --  39* 51* 30* 21* 18 16  CREATININE 1.12  --  2.20* 2.00* 1.49* 1.32* 1.30* 1.24  CALCIUM 8.9  --  8.5* 8.4* 8.9 8.4* 8.5* 8.7*  MG <0.1* 2.9* 2.7* 2.5* 2.0  --   --   --   PHOS  --   --  5.8*  --   --   --   --   --     Liver Function Tests:  Recent Labs Lab 04/01/16 0447 04/02/16 0446 04/03/16 0500 04/05/16 0445 04/06/16 0536  AST >2275* 5483* 1763* 351* 196*  ALT 5950* 4627* 3216* 1616* 1213*  ALKPHOS 76 101 91 84 90  BILITOT 3.4* 3.2* 3.1* 2.0*  2.1*  PROT 6.0* 6.1* 6.0* 5.8* 6.2*  ALBUMIN 3.5 3.3* 3.1* 2.8* 3.0*   No results for input(s): LIPASE, AMYLASE in the last 168 hours. No results for input(s): AMMONIA in the last 168 hours.  CBC:  Recent Labs Lab 04/02/16 0446 04/03/16 0500 04/04/16 0300 04/05/16 0445 04/06/16 0536  WBC 13.5* 9.1 8.5 6.3 7.9  NEUTROABS  --  7.4  --   --   --   HGB 14.0 14.4 14.2 14.3 14.4  HCT 41.8 44.0 43.3 43.1 44.0  MCV 97.7 97.8 97.5 98.0 96.5  PLT 181 185 177 166 150    Cardiac Enzymes:  Recent Labs Lab 03/31/16 0658 03/31/16 1245 03/31/16 1739  TROPONINI 0.17* 0.20* 0.19*    BNP: BNP (last 3 results)  Recent Labs  03/31/16 0658 04/03/16 0500  BNP 1693.0* 887.2*    ProBNP (last 3 results) No results for input(s): PROBNP in the last 8760 hours.    Other results:  Imaging: No results found.   Medications:     Scheduled Medications: . ALPRAZolam  1 mg Oral TID  . amiodarone  200 mg Oral BID  . apixaban  5 mg Oral BID  . aspirin EC  81 mg Oral Daily  . digoxin  0.125 mg Oral Daily  . folic acid  1 mg Oral Daily  . furosemide  40 mg Oral Daily  . lactulose  10 g Oral BID  . sodium chloride flush  10-40 mL Intracatheter Q12H  . sodium chloride flush  3 mL Intravenous Q12H  . spironolactone  12.5 mg Oral Daily  . thiamine  100 mg Oral Daily  . tiotropium  18 mcg Inhalation Daily    Infusions: . milrinone 0.125 mcg/kg/min (04/06/16 0000)    PRN Medications: sodium chloride, sodium chloride, sodium chloride, acetaminophen, ondansetron (ZOFRAN) IV, sodium chloride flush     Assessment/Plan/Discussion:    60 yo heavy drinker presented to [facility] in afib/RVR and acute systolic CHF with cardiogenic shock.  Initially on dopamine and dobutamine at [facility], titrated off at [facility].   1. Acute systolic CHF: ?etiology.  Possibilities include tachy-mediated CMP with atrial fib/RVR of unknown duration and ETOH CMP.  No CAD on coronary angiography 4/7.  - Co-ox improved  on milrinone 0.125. CVP 3. Will stop milrinone.  - Now on digoxin and low dose spironolactone. Start losartan 25 qhs. Avoid beta blocker with low output.    - Continue to follow CVP/co-ox  - If tachy-mediated  CMP, hopefully will get better soon. Continue amio. Switch to 200 bid. Given ETOH history, may be poor candidate for advanced therapies.  2. Elevated LFTs: Suspect shock liver.  Abdominal US ok at [facility].  Coming down.  Can continue amiodarone.  3. Atrial fibrillation: Paroxysmal, now back in NSR on amiodarone.  Uncertain duration.  On amio 200 bid. Continue Eliquis.  4. ETOH abuse: Counseled to quit.  5. AKI: In setting of suspected cardiogenic shock.  Now resolving.   6. Hyponatremia, mild   Bensimhon, Daniel MD 04/06/2016 2:26 PM

## 2016-04-07 LAB — COMPREHENSIVE METABOLIC PANEL
ALT: 905 U/L — ABNORMAL HIGH (ref 17–63)
AST: 115 U/L — ABNORMAL HIGH (ref 15–41)
Albumin: 3 g/dL — ABNORMAL LOW (ref 3.5–5.0)
Alkaline Phosphatase: 81 U/L (ref 38–126)
Anion gap: 10 (ref 5–15)
BUN: 17 mg/dL (ref 6–20)
CO2: 27 mmol/L (ref 22–32)
Calcium: 8.7 mg/dL — ABNORMAL LOW (ref 8.9–10.3)
Chloride: 97 mmol/L — ABNORMAL LOW (ref 101–111)
Creatinine, Ser: 1.3 mg/dL — ABNORMAL HIGH (ref 0.61–1.24)
GFR calc Af Amer: >60 mL/min (ref >60)
GFR calc non Af Amer: 59 mL/min — ABNORMAL LOW (ref >60)
Glucose, Bld: 104 mg/dL — ABNORMAL HIGH (ref 65–99)
Potassium: 4.2 mmol/L (ref 3.5–5.1)
Sodium: 134 mmol/L — ABNORMAL LOW (ref 135–145)
Total Bilirubin: 2 mg/dL — ABNORMAL HIGH (ref 0.3–1.2)
Total Protein: 6.4 g/dL — ABNORMAL LOW (ref 6.5–8.1)

## 2016-04-07 LAB — CBC
HCT: 46.1 % (ref 39.0–52.0)
Hemoglobin: 15.3 g/dL (ref 13.0–17.0)
MCH: 32.2 pg (ref 26.0–34.0)
MCHC: 33.2 g/dL (ref 30.0–36.0)
MCV: 97.1 fL (ref 78.0–100.0)
Platelets: 152 10*3/uL (ref 150–400)
RBC: 4.75 MIL/uL (ref 4.22–5.81)
RDW: 14.4 % (ref 11.5–15.5)
WBC: 7 10*3/uL (ref 4.0–10.5)

## 2016-04-07 LAB — CARBOXYHEMOGLOBIN
Carboxyhemoglobin: 1.2 % (ref 0.5–1.5)
Carboxyhemoglobin: 1.5 % (ref 0.5–1.5)
Methemoglobin: 0.6 % (ref 0.0–1.5)
Methemoglobin: 0.6 % (ref 0.0–1.5)
O2 Saturation: 59.3 %
O2 Saturation: 53.4 %
Total hemoglobin: 16 g/dL (ref 13.5–18.0)
Total hemoglobin: 16.2 g/dL (ref 13.5–18.0)

## 2016-04-07 MED ORDER — SODIUM CHLORIDE 0.9 % IV BOLUS (SEPSIS)
250.0000 mL | Freq: Once | INTRAVENOUS | Status: AC
Start: 2016-04-07 — End: 2016-04-07
  Administered 2016-04-07: 250 mL via INTRAVENOUS

## 2016-04-07 NOTE — Progress Notes (Signed)
  Repeat co-ox 53.4%.  Discussed with patient.  Will leave off milrinone for the evening and check Mixed Venous sat in the morning.   Will make decision on going home on/off milrinone in am.     Baldwin Crown" [place], [place] 04/07/2016 2:56 PM

## 2016-04-07 NOTE — Progress Notes (Addendum)
Patient ID: Caleb Barnes, male   DOB: [birth year], 59 y.o.   MRN: [id number]    Advanced Heart Failure Rounding Note   Subjective:    60 y.o. male with h/o tobacco abuse up until 2014 and ETOH abuse. He presented to [facility] ED on 4/3 with SOB. Had elevated BNP and LFTs. He was found to be in atrial flutter with RVR with 2:1 conduction. CXR concerning for edema. ECHO with EF 20-25%. GI consulted due to elevated LFTs. Abd [place] with gall bladder thickening but normal-appearing liver.  CTA chest with no PE.   Started on amio drip and diuresed with 40 mg IV lasix at [facility].  He had limited urine output and was started on dopamine and dobutamine. Due to worsening condition he was transferred to [facility] for HF management. He was titrated off both meds in the CCU and went back into NSR.     [facility] 4/7. Normal cors.  RHC  showed mildly elevated left and right heart filling pressures and low cardiac output (off inotropes).    Started on milrinone 4/7.   Milrinone turned off yesterday. Feels well. He remains in NSR. No complaints, breathing better. Ambulating hall. Co-ox 68%-> 59%. CVP 1-2.  Renal function stable.  SBP 90.      RHC/LHC 4/7 Left Main  No significant disease.      Left Anterior Descending  Luminal irregularities, 30% mid LAD stenosis.     Ramus Intermedius  No significant disease.     Left Circumflex  Luminal irregularities.     Right Coronary Artery  Luminal irregularities.       Right Heart Pressures RHC Procedural Findings: Hemodynamics (mmHg) RA mean 7 RV 43/9 PA 43/25, mean 32 PCWP mean 21 LV 83/20 AO 84/58  Oxygen saturations: PA 46% AO 95%  Cardiac Output (Fick) 2.67  Cardiac Index (Fick) 1.41  Cardiac Output (Thermo) 3.38 Cardiac Index (Thermo) 1.78     Objective:   Weight Range:  Vital Signs:   Temp:  [97.7 F (36.5 C)-99.3 F (37.4 C)] 98.7 F (37.1 C) (04/10 0400) Pulse Rate:  [78-90] 78 (04/10 0000) Resp:  [18-20] 18 (04/09 1700) BP:  (83-115)/(52-84) 91/68 mmHg (04/10 0000) SpO2:  [97 %-100 %] 100 % (04/09 2320) Weight:  [68.357 kg (150 lb 11.2 oz)-69.4 kg (153 lb)] 68.357 kg (150 lb 11.2 oz) (04/10 0400) Last BM Date: 04/03/16  Weight change: Filed Weights   04/05/16 0447 04/06/16 0653 04/07/16 0400  Weight: 69.627 kg (153 lb 8 oz) 69.4 kg (153 lb) 68.357 kg (150 lb 11.2 oz)    Intake/Output:   Intake/Output Summary (Last 24 hours) at 04/07/16 0652 Last data filed at 04/07/16 0438  Gross per 24 hour  Intake 228.22 ml  Output   2100 ml  Net -1871.78 ml     Physical Exam: General:  Well appearing. No resp difficulty. In bed  HEENT: normal. LIJ  Neck: supple. JVP 3 Carotids 2+ bilat; no bruits. No lymphadenopathy or thryomegaly appreciated. Cor: PMI nondisplaced. Regular rate & rhythm. No rubs, gallops or murmurs. Lungs: clear Abdomen: soft, nontender, nondistended. No hepatosplenomegaly. No bruits or masses. Good bowel sounds. Extremities: no cyanosis, clubbing, rash, edema R and LLE warm  Neuro: alert & orientedx3, cranial nerves grossly intact. moves all 4 extremities w/o difficulty. Affect pleasant  Telemetry: NSR in 90s   Labs: Basic Metabolic Panel:  Recent Labs Lab 03/31/16 0658 03/31/16 1438 04/01/16 0447 04/02/16 0446 04/03/16 0500 04/04/16 0300 04/05/16 0445 04/06/16 0536 04/07/16 0502  NA 132*  --  127* 133* 137 133* 134* 133* 134*  K 3.5  --  4.4 3.2* 3.4* 3.9 3.8 4.1 4.2  CL 99*  --  93* 95* 93* 95* 97* 96* 97*  CO2 23  --  22 31 32 27 28 27 27   GLUCOSE 146*  --  147* 104* 104* 105* 146* 120* 104*  BUN 19  --  39* 51* 30* 21* 18 16 17   CREATININE 1.12  --  2.20* 2.00* 1.49* 1.32* 1.30* 1.24 1.30*  CALCIUM 8.9  --  8.5* 8.4* 8.9 8.4* 8.5* 8.7* 8.7*  MG <0.1* 2.9* 2.7* 2.5* 2.0  --   --   --   --   PHOS  --   --  5.8*  --   --   --   --   --   --     Liver Function Tests:  Recent Labs Lab 04/02/16 0446 04/03/16 0500 04/05/16 0445 04/06/16 0536 04/07/16 0502  AST 5483*  1763* 351* 196* 115*  ALT 4627* 3216* 1616* 1213* 905*  ALKPHOS 101 91 84 90 81  BILITOT 3.2* 3.1* 2.0* 2.1* 2.0*  PROT 6.1* 6.0* 5.8* 6.2* 6.4*  ALBUMIN 3.3* 3.1* 2.8* 3.0* 3.0*   No results for input(s): LIPASE, AMYLASE in the last 168 hours. No results for input(s): AMMONIA in the last 168 hours.  CBC:  Recent Labs Lab 04/03/16 0500 04/04/16 0300 04/05/16 0445 04/06/16 0536 04/07/16 0502  WBC 9.1 8.5 6.3 7.9 7.0  NEUTROABS 7.4  --   --   --   --   HGB 14.4 14.2 14.3 14.4 15.3  HCT 44.0 43.3 43.1 44.0 46.1  MCV 97.8 97.5 98.0 96.5 97.1  PLT 185 177 166 150 152    Cardiac Enzymes:  Recent Labs Lab 03/31/16 0658 03/31/16 1245 03/31/16 1739  TROPONINI 0.17* 0.20* 0.19*    BNP: BNP (last 3 results)  Recent Labs  03/31/16 0658 04/03/16 0500  BNP 1693.0* 887.2*    ProBNP (last 3 results) No results for input(s): PROBNP in the last 8760 hours.    Other results:  Imaging: No results found.   Medications:     Scheduled Medications: . ALPRAZolam  1 mg Oral TID  . amiodarone  200 mg Oral BID  . apixaban  5 mg Oral BID  . aspirin EC  81 mg Oral Daily  . digoxin  0.125 mg Oral Daily  . folic acid  1 mg Oral Daily  . furosemide  40 mg Oral Daily  . lactulose  10 g Oral BID  . losartan  25 mg Oral Daily  . sodium chloride flush  10-40 mL Intracatheter Q12H  . sodium chloride flush  3 mL Intravenous Q12H  . spironolactone  12.5 mg Oral Daily  . thiamine  100 mg Oral Daily  . tiotropium  18 mcg Inhalation Daily    Infusions:    PRN Medications: sodium chloride, sodium chloride, sodium chloride, acetaminophen, ondansetron (ZOFRAN) IV, sodium chloride flush     Assessment/Plan/Discussion:    61 yo heavy drinker presented to [facility] in afib/RVR and acute systolic CHF with cardiogenic shock.  Initially on dopamine and dobutamine at [facility], titrated off at [facility].   1. Acute systolic CHF: ?etiology.  Possibilities include tachy-mediated CMP with atrial  fib/RVR of unknown duration and ETOH CMP.  No CAD on coronary angiography 4/7.  - Milrinone off. Co-ox down to 59%. Now on losartan, digoxin and low dose spironolactone. Avoid beta blocker  with low output.    - CVP 1-2. Will give 250cc NS. Hold lasix. Will recheck co-ox later today and if >= 59% can go home later today.  - If tachy-mediated CMP, hopefully will get better soon. Continue amio 200 bid. Given ETOH history, may be poor candidate for advanced therapies.  2. Elevated LFTs: Suspect shock liver.  Abdominal US ok at [facility].  Coming down.  Can continue amiodarone.  3. Atrial fibrillation: Paroxysmal, now back in NSR on amiodarone.  Uncertain duration.  On amio 200 bid. Continue Eliquis.  4. ETOH abuse: Counseled to quit.  5. AKI: In setting of suspected cardiogenic shock.  Now resolved 6. Hyponatremia, mild   Arvilla Meres MD 04/07/2016 6:52 AM

## 2016-04-07 NOTE — Progress Notes (Signed)
CARDIAC REHAB PHASE I   PRE:  Rate/Rhythm: 77 SR  BP:  Sitting: 99/64        SaO2: 96 RA  MODE:  Ambulation: 700 ft   POST:  Rate/Rhythm: 82 SR  BP:  Sitting: 90/71         SaO2: 97 RA  Pt ambulated 700 ft on RA, IV, gait belt, fairly steady gait, tolerated well, no complaints. Completed a.fib/CHF educaiton with pt and mother at bedside. Reviewed risk factors, alcohol cessation, "Off the beat book," anticoagulation, CHF booklet and zone tool, s/s heart failure, daily weights, sodium and fluid restrictions, exercise guidelines, heart healthy diet and phase 2 cardiac rehab. Pt verbalized understanding, receptive to education. Pt agrees to phase 2 cardiac rehab referral, will send to [facility] per pt request. Pt to recliner after walk, call bell within reach. Will follow.   [id number] Joylene Grapes, RN, BSN 04/07/2016 11:58 AM

## 2016-04-08 ENCOUNTER — Encounter (HOSPITAL_COMMUNITY): Payer: Self-pay | Admitting: Student

## 2016-04-08 LAB — CBC
HCT: 46.1 % (ref 39.0–52.0)
Hemoglobin: 15 g/dL (ref 13.0–17.0)
MCH: 31.8 pg (ref 26.0–34.0)
MCHC: 32.5 g/dL (ref 30.0–36.0)
MCV: 97.7 fL (ref 78.0–100.0)
Platelets: 163 10*3/uL (ref 150–400)
RBC: 4.72 MIL/uL (ref 4.22–5.81)
RDW: 14.4 % (ref 11.5–15.5)
WBC: 5.9 10*3/uL (ref 4.0–10.5)

## 2016-04-08 LAB — COMPREHENSIVE METABOLIC PANEL
ALT: 658 U/L — ABNORMAL HIGH (ref 17–63)
AST: 82 U/L — ABNORMAL HIGH (ref 15–41)
Albumin: 3.1 g/dL — ABNORMAL LOW (ref 3.5–5.0)
Alkaline Phosphatase: 72 U/L (ref 38–126)
Anion gap: 10 (ref 5–15)
BUN: 14 mg/dL (ref 6–20)
CO2: 25 mmol/L (ref 22–32)
Calcium: 8.6 mg/dL — ABNORMAL LOW (ref 8.9–10.3)
Chloride: 96 mmol/L — ABNORMAL LOW (ref 101–111)
Creatinine, Ser: 1.26 mg/dL — ABNORMAL HIGH (ref 0.61–1.24)
GFR calc Af Amer: >60 mL/min (ref >60)
GFR calc non Af Amer: >60 mL/min (ref >60)
Glucose, Bld: 101 mg/dL — ABNORMAL HIGH (ref 65–99)
Potassium: 4.4 mmol/L (ref 3.5–5.1)
Sodium: 131 mmol/L — ABNORMAL LOW (ref 135–145)
Total Bilirubin: 1.7 mg/dL — ABNORMAL HIGH (ref 0.3–1.2)
Total Protein: 6.7 g/dL (ref 6.5–8.1)

## 2016-04-08 LAB — CARBOXYHEMOGLOBIN
Carboxyhemoglobin: 1.7 % — ABNORMAL HIGH (ref 0.5–1.5)
Methemoglobin: 0.6 % (ref 0.0–1.5)
O2 Saturation: 61.8 %
Total hemoglobin: 15.8 g/dL (ref 13.5–18.0)

## 2016-04-08 LAB — MAGNESIUM: Magnesium: 2 mg/dL (ref 1.7–2.4)

## 2016-04-08 LAB — DIGOXIN LEVEL: Digoxin Level: 0.6 ng/mL — ABNORMAL LOW (ref 0.8–2.0)

## 2016-04-08 MED ORDER — FUROSEMIDE 20 MG PO TABS: 20.0000 mg | ORAL_TABLET | ORAL | Status: DC | PRN

## 2016-04-08 MED ORDER — APIXABAN 5 MG PO TABS: 5.0000 mg | ORAL_TABLET | Freq: Two times a day (BID) | ORAL | Status: DC

## 2016-04-08 MED ORDER — LOSARTAN POTASSIUM 25 MG PO TABS: 25.0000 mg | ORAL_TABLET | Freq: Every day | ORAL | Status: DC

## 2016-04-08 MED ORDER — AMIODARONE HCL 200 MG PO TABS: 200.0000 mg | ORAL_TABLET | Freq: Two times a day (BID) | ORAL | Status: DC

## 2016-04-08 MED ORDER — DIGOXIN 125 MCG PO TABS: 0.1250 mg | ORAL_TABLET | Freq: Every day | ORAL | Status: DC

## 2016-04-08 MED ORDER — SPIRONOLACTONE 25 MG PO TABS: 12.5000 mg | ORAL_TABLET | Freq: Every day | ORAL | Status: DC

## 2016-04-08 NOTE — Discharge Summary (Signed)
Advanced Heart Failure Discharge Note   Discharge Summary   Patient ID: Caleb Barnes MRN: [id number], DOB/AGE: 1956-01-26 60 y.o. Admit date: 04/02/2016 D/C date:     04/08/2016   Primary Discharge Diagnoses:  1. Acute systolic CHF: New diagnosis. EF 20-25% ? 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  2. Elevated LFTs 3. Paroxysmal atrial fibrillation 4. ETOH abuse 5. AKI - resolved 6. Hyponatremia  Consultations:  GI [facility]) CCM [facility])  Hospital Course:   Caleb Barnes is a 60 y.o. male with h/o tobacco abuse and ETOH abuse. Who presented to [facility] ED on 03/31/16 with SOB. BNP and LFTs were elevated. Was found to be in acute HF in setting of Aflutter with RVR with 2:1 conduction. Echo 03/31/16 with reduced EF to 20-25%, mild MR, mild/mod reduced RV, Peak PA systolic 43 mm Hg.  CTA chest with no PE. He was also placed on CIWA protocol for extensive ETOH use as an outpatient. GI consulted with elevated LFTs at [facility]. Abd [place] with gall bladder thickening but normal-appearing liver. Elevated LFTs felt to be to hid HF. He was started on amio drip for aflutter. Diuresed with IV lasix 40 mg BID  He was started on Dobutamine and Dopamine drips with low urine output on 04/02/16. Was transferred to [facility] for further evaluation and treatment with worsening condition.   He was weaned off both dopamine and dobutamine in CCU and went back into NSR.  [facility] 04/04/16 with low CO, mildly elevated filling pressures, and mild nonobstructive CAD. (Full report below)  Started on milrinone with low cardiac output. 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  With ETOH history, would be poor candidate for advanced therapies. LFTs continued to trend down with hemodynamic stability.  He continued to improve symptomatically as his medications were adjusted.  Mixed venous saturation remained stable with milrinone wean.  Morning of 04/07/16 mixed venous saturation was 59% off milrinone.  Recheck that afternoon 54%. Kept overnight for recheck in am to decide further on home inotrope support and medication adjustment.   On am of 04/08/16, mixed venous saturation 61.8% OFF milrinone so thought stable to be sent home without it.  Volume status stable so given 20 mg po lasix as needed for home.   Overall he was out 9 L and down 5 lbs from highest weight this admission. He will be discharged home in stable condition with close follow up in the HF clinic as below.   Discharge Weight Range: 152 lbs Discharge Vitals: Blood pressure 92/76, pulse 86, temperature 97.8 F (36.6 C), temperature source Oral, resp. rate 18, height 5\' 11"  (1.803 m), weight 152 lb (68.947 kg), SpO2 96 %.  Labs: Lab Results  Component Value Date   WBC 5.9 04/08/2016   HGB 15.0 04/08/2016   HCT 46.1 04/08/2016   MCV 97.7 04/08/2016   PLT 163 04/08/2016     Recent Labs Lab 04/08/16 0400  NA 131*  K 4.4  CL 96*  CO2 25  BUN 14  CREATININE 1.26*  CALCIUM 8.6*  PROT 6.7  BILITOT 1.7*  ALKPHOS 72  ALT 658*  AST 82*  GLUCOSE 101*   No results found for: CHOL, HDL, LDLCALC, TRIG BNP (last 3 results)  Recent Labs  03/31/16 0658 04/03/16 0500  BNP 1693.0* 887.2*    ProBNP (last 3 results) No results for input(s): PROBNP in the last 8760 hours.   Diagnostic Studies/Procedures   [facility] 04/04/16  Dominance: Left   Left  Main  No significant disease.     Left Anterior Descending  Luminal irregularities, 30% mid LAD stenosis.     Ramus Intermedius  No significant disease.     Left Circumflex  Luminal irregularities.     Right Coronary Artery  Luminal irregularities.       Right Heart Pressures RHC Procedural Findings: Hemodynamics (mmHg) RA mean 7 RV 43/9 PA 43/25, mean 32 PCWP mean 21 LV 83/20 AO 84/58  Oxygen saturations: PA 46% AO 95%  Cardiac Output (Fick) 2.67  Cardiac Index (Fick) 1.41  Cardiac Output (Thermo) 3.38 Cardiac Index (Thermo) 1.78      Discharge Medications     Medication List    TAKE these medications        albuterol 108 (90 Base) MCG/ACT inhaler  Commonly known as:  PROVENTIL HFA;VENTOLIN HFA  Inhale 1 puff into the lungs every 4 (four) hours as needed for wheezing or shortness of breath.     amiodarone 200 MG tablet  Commonly known as:  PACERONE  Take 1 tablet (200 mg total) by mouth 2 (two) times daily.     apixaban 5 MG Tabs tablet  Commonly known as:  ELIQUIS  Take 1 tablet (5 mg total) by mouth 2 (two) times daily.     digoxin 0.125 MG tablet  Commonly known as:  LANOXIN  Take 1 tablet (0.125 mg total) by mouth daily.     furosemide 20 MG tablet  Commonly known as:  LASIX  Take 1 tablet (20 mg total) by mouth as needed. For swelling or weight gain of 3 lbs overnight or 5 lbs within one week.     losartan 25 MG tablet  Commonly known as:  COZAAR  Take 1 tablet (25 mg total) by mouth at bedtime.     spironolactone 25 MG tablet  Commonly known as:  ALDACTONE  Take 0.5 tablets (12.5 mg total) by mouth daily.     tiotropium 18 MCG inhalation capsule  Commonly known as:  SPIRIVA  Place 18 mcg into inhaler and inhale daily.        Disposition   The patient will be discharged in stable condition to home. Discharge Instructions    Amb Referral to Cardiac Rehabilitation    Complete by:  As directed   Diagnosis:  Heart Failure (see criteria below if ordering Phase II)  Heart Failure Type:  Chronic Systolic     Amb Referral to HF Clinic    Complete by:  As directed      Amb Referral to Phase II Cardiac  Rehab    Complete by:  As directed   Diagnosis:  Heart Failure (see criteria below if ordering Phase II)  Heart Failure Type:  Chronic Systolic     Diet - low sodium heart healthy    Complete by:  As directed      Heart Failure patients record your daily weight using the same scale at the same time of day    Complete by:  As directed      Increase activity slowly    Complete by:  As  directed           Follow-up Information    Follow up with [facility] On 04/16/2016.   Specialty:  Cardiology   Why:  at 1000 for post hospital follow up. Please bring all of your medications to your visit. The code for patient parking is 2000.   Contact information:  [address] [id number] mc [place] [address] [phone]        Duration of Discharge Encounter: Greater than 35 minutes   Signed, Luane School 04/08/2016, 10:32 AM   Patient seen and examined with Otilio Saber, PA-C. We discussed all aspects of the encounter. I agree with the assessment and plan as stated above.   Ready for d/c. Co-ox improved. Will need f/u in HF clinic.   Elliott Quade,MD 8:59 PM

## 2016-04-08 NOTE — Progress Notes (Signed)
Patient discharged per orders. Patient's mother at bedside during d/c teaching/instructions. Medications, prescriptions, follow up appointments discussed. Daily weights, diet, exercise discussed. Time spent discussing when to take PRN lasix. Time spent/scenarios discussed regarding when call the doctor (short of breath, swelling, etc.) Pt and patient's mother verbalized understanding. Pt transported to the main entrance via wheelchair for d/c home.  Asher Muir Carnesha Maravilla,RN

## 2016-04-08 NOTE — Progress Notes (Signed)
Patient ID: Uzziel Russey, male   DOB: 1956-07-09, 60 y.o.   MRN: [id number]    Advanced Heart Failure Rounding Note   Subjective:    60 y.o. male with h/o tobacco abuse up until 2014 and ETOH abuse. He presented to [facility] ED on 4/3 with SOB. Had elevated BNP and LFTs. He was found to be in atrial flutter with RVR with 2:1 conduction. CXR concerning for edema. ECHO with EF 20-25%. GI consulted due to elevated LFTs. Abd [place] with gall bladder thickening but normal-appearing liver.  CTA chest with no PE.   Started on amio drip and diuresed with 40 mg IV lasix at [facility].  He had limited urine output and was started on dopamine and dobutamine. Due to worsening condition he was transferred to [facility] for HF management. He was titrated off both meds in the CCU and went back into NSR.     [facility] 4/7. Normal cors.  RHC  showed mildly elevated left and right heart filling pressures and low cardiac output (off inotropes).    Started on milrinone 4/7.   Milrinone turned off 04/06/16. Held overnight 04/07/16 with Coox 59% -> 54%.  Coox 61% this am.  Feels good. Remains in NSR. SBPs in 80s this am.  Ready to go home. Denies lightheadedness or dizziness. No CP. Walking unit without difficulty.      RHC/LHC 4/7 Left Main  No significant disease.      Left Anterior Descending  Luminal irregularities, 30% mid LAD stenosis.     Ramus Intermedius  No significant disease.     Left Circumflex  Luminal irregularities.     Right Coronary Artery  Luminal irregularities.       Right Heart Pressures RHC Procedural Findings: Hemodynamics (mmHg) RA mean 7 RV 43/9 PA 43/25, mean 32 PCWP mean 21 LV 83/20 AO 84/58  Oxygen saturations: PA 46% AO 95%  Cardiac Output (Fick) 2.67  Cardiac Index (Fick) 1.41  Cardiac Output (Thermo) 3.38 Cardiac Index (Thermo) 1.78     Objective:   Weight Range:  Vital Signs:   Temp:  [97.7 F (36.5 C)-99.4 F (37.4 C)] 97.7 F (36.5 C) (04/11 0352) Pulse Rate:   [77-86] 86 (04/10 1800) Resp:  [16-20] 16 (04/11 0348) BP: (79-114)/(55-80) 80/63 mmHg (04/11 0600) SpO2:  [96 %-100 %] 96 % (04/11 0348) Weight:  [152 lb (68.947 kg)] 152 lb (68.947 kg) (04/11 0352) Last BM Date: 04/06/16  Weight change: Filed Weights   04/06/16 0653 04/07/16 0400 04/08/16 0352  Weight: 153 lb (69.4 kg) 150 lb 11.2 oz (68.357 kg) 152 lb (68.947 kg)    Intake/Output:   Intake/Output Summary (Last 24 hours) at 04/08/16 0719 Last data filed at 04/08/16 0400  Gross per 24 hour  Intake    940 ml  Output   1500 ml  Net   -560 ml     Physical Exam: CVP 6-7 General:  No resp difficulty. Lying in bed  HEENT: normal. LIJ  Neck: supple. JVP 6-7 Carotids 2+ bilat; no bruits. No thyromegaly or nodule noted Cor: PMI nondisplaced. RRR. No M/G/R Lungs: clear Abdomen: soft, NT, ND, no HSM. No bruits or masses. +BS  Extremities: no cyanosis, clubbing, rash, edema R and LLE warm  Neuro: alert & orientedx3, cranial nerves grossly intact. moves all 4 extremities w/o difficulty. Affect pleasant  Telemetry: NSR in 90s   Labs: Basic Metabolic Panel:  Recent Labs Lab 04/02/16 0446 04/03/16 0500 04/04/16 0300 04/05/16 0445 04/06/16 0536 04/07/16 0502 04/08/16  0400  NA 133* 137 133* 134* 133* 134* 131*  K 3.2* 3.4* 3.9 3.8 4.1 4.2 4.4  CL 95* 93* 95* 97* 96* 97* 96*  CO2 31 32 27 28 27 27 25   GLUCOSE 104* 104* 105* 146* 120* 104* 101*  BUN 51* 30* 21* 18 16 17 14   CREATININE 2.00* 1.49* 1.32* 1.30* 1.24 1.30* 1.26*  CALCIUM 8.4* 8.9 8.4* 8.5* 8.7* 8.7* 8.6*  MG 2.5* 2.0  --   --   --   --  2.0    Liver Function Tests:  Recent Labs Lab 04/03/16 0500 04/05/16 0445 04/06/16 0536 04/07/16 0502 04/08/16 0400  AST 1763* 351* 196* 115* 82*  ALT 3216* 1616* 1213* 905* 658*  ALKPHOS 91 84 90 81 72  BILITOT 3.1* 2.0* 2.1* 2.0* 1.7*  PROT 6.0* 5.8* 6.2* 6.4* 6.7  ALBUMIN 3.1* 2.8* 3.0* 3.0* 3.1*   No results for input(s): LIPASE, AMYLASE in the last 168  hours. No results for input(s): AMMONIA in the last 168 hours.  CBC:  Recent Labs Lab 04/03/16 0500 04/04/16 0300 04/05/16 0445 04/06/16 0536 04/07/16 0502 04/08/16 0400  WBC 9.1 8.5 6.3 7.9 7.0 5.9  NEUTROABS 7.4  --   --   --   --   --   HGB 14.4 14.2 14.3 14.4 15.3 15.0  HCT 44.0 43.3 43.1 44.0 46.1 46.1  MCV 97.8 97.5 98.0 96.5 97.1 97.7  PLT 185 177 166 150 152 163    Cardiac Enzymes: No results for input(s): CKTOTAL, CKMB, CKMBINDEX, TROPONINI in the last 168 hours.  BNP: BNP (last 3 results)  Recent Labs  03/31/16 0658 04/03/16 0500  BNP 1693.0* 887.2*    ProBNP (last 3 results) No results for input(s): PROBNP in the last 8760 hours.    Other results:  Imaging: No results found.   Medications:     Scheduled Medications: . ALPRAZolam  1 mg Oral TID  . amiodarone  200 mg Oral BID  . apixaban  5 mg Oral BID  . aspirin EC  81 mg Oral Daily  . digoxin  0.125 mg Oral Daily  . folic acid  1 mg Oral Daily  . losartan  25 mg Oral Daily  . sodium chloride flush  10-40 mL Intracatheter Q12H  . sodium chloride flush  3 mL Intravenous Q12H  . spironolactone  12.5 mg Oral Daily  . thiamine  100 mg Oral Daily  . tiotropium  18 mcg Inhalation Daily    Infusions:    PRN Medications: sodium chloride, sodium chloride, sodium chloride, acetaminophen, ondansetron (ZOFRAN) IV, sodium chloride flush     Assessment/Plan/Discussion:    60 yo heavy drinker presented to [facility] in afib/RVR and acute systolic CHF with cardiogenic shock.  Initially on dopamine and dobutamine at [facility], titrated off at [facility].   1. Acute systolic CHF: ?etiology.  Possibilities include tachy-mediated CMP with atrial fib/RVR of unknown duration and ETOH CMP.  No CAD on coronary angiography 4/7.  - Coox marginal this am.  Will leave off milrinone for now. Will need close follow up.   - Remains on losartan, digoxin and low dose spironolactone. Avoid beta blocker with low output.    - CVP  6-7.  Will give lasix 20 mg to use as needed for swelling.  - If tachy-mediated CMP, hopefully will improve over time. Continue amio 200 bid. Given ETOH history, may be poor candidate for advanced therapies.  2. Elevated LFTs: Suspect shock liver.  Abdominal US ok  at [facility].  Continue to trend down.   - OK to continue amiodarone with downtrend 3. Atrial fibrillation: Paroxysmal, Remains in NSR on amiodarone.  Uncertain duration.  On amio 200 bid. Continue Eliquis.  4. ETOH abuse: Counseled to quit.  5. AKI: In setting of suspected cardiogenic shock.  Now resolved 6. Hyponatremia, mild  Graciella Freer PA-C 04/08/2016 7:19 AM   Advanced Heart Failure Team Pager [phone] (M-F; 7a - 4p)  Please contact [facility] for night-coverage after hours (4p -7a ) and weekends on [email or web address]     Patient seen and examined with Otilio Saber, PA-C. We discussed all aspects of the encounter. I agree with the assessment and plan as stated above.   Co-ox improved today. Feels better. CVP 6. Will send home today with close f/u. Losartan 25 at night. Spiro 12.5. Lasix prn only. No work for now.   Sibbie Flammia,MD 8:17 AM

## 2016-04-08 NOTE — Care Management Note (Signed)
Case Management Note  Patient Details  Name: Torrence Mcnear MRN: [id number] Date of Birth: 06/03/1956  Subjective/Objective:   Adm w chf                 Action/Plan: lives at home   Expected Discharge Date:                  Expected Discharge Plan:  Home/Self Care  In-House Referral:     Discharge planning Services  CM Consult, Medication Assistance, HF Clinic  Post Acute Care Choice:    Choice offered to:     DME Arranged:    DME Agency:     HH Arranged:    HH Agency:     Status of Service:  Completed, signed off  Medicare Important Message Given:    Date Medicare IM Given:    Medicare IM give by:    Date Additional Medicare IM Given:    Additional Medicare Important Message give by:     If discussed at Long Length of Stay Meetings, dates discussed:    Additional Comments: per nse pt ambul indep around unit. Gave pt 30day free and copay card for 10.00 per month for eliquis. Pt has [employer] ins.  Hanley Hays, RN 04/08/2016, 10:19 AM

## 2016-04-08 NOTE — Discharge Instructions (Signed)
Information on my medicine - ELIQUIS (apixaban)  This medication education was reviewed with me or my healthcare representative as part of my discharge preparation.    Why was Eliquis prescribed for you? Eliquis was prescribed for you to reduce the risk of forming blood clots that can cause a stroke if you have a medical condition called atrial fibrillation (a type of irregular heartbeat) What do You need to know about Eliquis ? Take your Eliquis TWICE DAILY - one tablet in the morning and one tablet in the evening with or without food.  It would be best to take the doses about the same time each day.  If you have difficulty swallowing the tablet whole please discuss with your pharmacist how to take the medication safely.  Take Eliquis exactly as prescribed by your doctor and DO NOT stop taking Eliquis without talking to the doctor who prescribed the medication.  Stopping may increase your risk of developing a new clot or stroke.  Refill your prescription before you run out.  After discharge, you should have regular check-up appointments with your healthcare provider that is prescribing your Eliquis.  In the future your dose may need to be changed if your kidney function or weight changes by a significant amount or as you get older.  What do you do if you miss a dose? If you miss a dose, take it as soon as you remember on the same day and resume taking twice daily.  Do not take more than one dose of ELIQUIS at the same time.  Important Safety Information A possible side effect of Eliquis is bleeding. You should call your healthcare provider right away if you experience any of the following: ? Bleeding from an injury or your nose that does not stop. ? Unusual colored urine (red or dark brown) or unusual colored stools (red or black). ? Unusual bruising for unknown reasons. ? A serious fall or if you hit your head (even if there is no bleeding).  Some medicines may interact with  Eliquis and might increase your risk of bleeding or clotting while on Eliquis. To help avoid this, consult your healthcare provider or pharmacist prior to using any new prescription or non-prescription medications, including herbals, vitamins, non-steroidal anti-inflammatory drugs (NSAIDs) and supplements.  This website has more information on Eliquis (apixaban): www.Eliquis.com.    

## 2016-04-08 NOTE — Progress Notes (Signed)
CARDIAC REHAB PHASE I   PRE:  Rate/Rhythm: 83 SR    BP: sitting 101/73    SaO2:   MODE:  Ambulation: 740 ft   POST:  Rate/Rhythm: 93 SR    BP: sitting 127/77     SaO2:   Pt slightly wobbly at first with walking. Steadier after a few feet and able to walk with supervision. Teach back for daily wts and low sodium, pt struggled some. Needs continued reinforcement. Sts he plans to read materials at home with his wife. [phone]  Harriet Masson CES, ACSM 04/08/2016 10:41 AM

## 2016-04-15 NOTE — Progress Notes (Signed)
Patient ID: [facility], male   DOB: 06-05-1956, 60 y.o.   MRN: [id number]    Advanced Heart Failure Clinic Note    Primary Care: [facility] Primary HF: Dr. Shirlee Latch   HPI:  [facility]aleb [facility] is a 61 y.o. male with h/o tobacco abuse and ETOH abuse. Who presented to [facility] ED on 03/31/16 with SOB. BNP and LFTs were elevated. Was found to be in Aflutter with RVR with 2:1 conduction. Echo 03/31/16 with reduced EF to 20-25%, mild MR, mild/mod reduced RV, Peak PA systolic 43 mm Hg. CTA chest with no PE. He was also placed on CIWA protocol for extensive ETOH use as an outpatient. GI consulted with elevated LFTs at [facility]. Abd [place] with gall bladder thickening but normal-appearing liver. He was started on amiodarone drip for aflutter. Diuresed with IV lasix 40 mg BID  He was started on Dobutamine and Dopamine drips with low urine output on 04/02/16. Was transferred to [facility] for further evaluation and treatment with worsening condition. He was weaned off both dopamine and dobutamine in CCU and went back into NSR.  [facility] 04/04/16 with low CO, mildly elevated filling pressures, and mild nonobstructive CAD. (Full report below) Started on milrinone with low cardiac output. 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 With ETOH history, poor candidate for advanced therapies. He tolerated milrinone wean and was discharge to home without inotropic support.   He presents today for post hospital follow up. Felt bad the first couple of days with joint pains, but has felt better everyday since then.  Breathing is better than it has been in a while. Can get around a grocery store without DOE. No problem with inclines, hasn't had to do any stairs. No palpitations. No CP, orthopnea/PND, no bendopnea. Has trouble going to sleep, doesn't snore, used to snore really bad per wife. Weight at home 149 lbs. He has not needed any lasix.   PMH 1. Systolic CHF - ?etiology. Possibilities include tachy-mediated CMP with atrial  fib/RVR of unknown duration and ETOH CMP. No CAD on coronary angiography today.  R/LHC 04/04/16  Right Heart Pressures Mild Non-obstructive CAD (30% mild LAD stenosis, otherwise luminal irregularities only) RHC Procedural Findings: Hemodynamics (mmHg) RA mean 7 RV 43/9 PA 43/25, mean 32 PCWP mean 21 LV 83/20 AO 84/58 Oxygen saturations: PA 46% AO 95% Cardiac Output (Fick) 2.67 Cardiac Index (Fick) 1.41 Cardiac Output (Thermo) 3.38 Cardiac Index (Thermo) 1.78 2. PAF/PAFL - Chemically converted with amiodarone, thought to be 2/2 dobutamine.  3. ETOH abuse   Past Medical History  Diagnosis Date  . COPD (chronic obstructive pulmonary disease) (HCC)   . CHF (congestive heart failure) (HCC)   . Polysubstance abuse   . Shortness of breath dyspnea   . Anxiety   . Acute renal failure [facility])     Current Outpatient Prescriptions  Medication Sig Dispense Refill  . albuterol (PROVENTIL HFA;VENTOLIN HFA) 108 (90 Base) MCG/ACT inhaler Inhale 1 puff into the lungs every 4 (four) hours as needed for wheezing or shortness of breath.    Marland Kitchen amiodarone (PACERONE) 200 MG tablet Take 1 tablet (200 mg total) by mouth 2 (two) times daily. 60 tablet 6  . apixaban (ELIQUIS) 5 MG TABS tablet Take 1 tablet (5 mg total) by mouth 2 (two) times daily. 60 tablet 6  . digoxin (LANOXIN) 0.125 MG tablet Take 1 tablet (0.125 mg total) by mouth daily. 30 tablet 6  . furosemide (LASIX) 20 MG tablet Take 1 tablet (20 mg total)  by mouth as needed. For swelling or weight gain of 3 lbs overnight or 5 lbs within one week. 30 tablet 6  . losartan (COZAAR) 25 MG tablet Take 1 tablet (25 mg total) by mouth at bedtime. 30 tablet 6  . spironolactone (ALDACTONE) 25 MG tablet Take 0.5 tablets (12.5 mg total) by mouth daily. 30 tablet 6  . tiotropium (SPIRIVA) 18 MCG inhalation capsule Place 18 mcg into inhaler and inhale daily.     No current facility-administered medications for this encounter.    No Known Allergies      Social History   Social History  . Marital Status: Divorced    Spouse Name: N/A  . Number of Children: N/A  . Years of Education: N/A   Occupational History  . Not on file.   Social History Main Topics  . Smoking status: Former Games developer  . Smokeless tobacco: Never Used  . Alcohol Use: No  . Drug Use: No  . Sexual Activity: Not on file   Other Topics Concern  . Not on file   Social History Narrative      Family History  Problem Relation Age of Onset  . CAD Father     Ceasar Mons Vitals:   04/16/16 1004  BP: 92/70  Pulse: 86  Weight: 156 lb 9.6 oz (71.033 kg)  SpO2: 94%   Wt Readings from Last 3 Encounters:  04/16/16 156 lb 9.6 oz (71.033 kg)  04/08/16 152 lb (68.947 kg)  03/31/16 168 lb 6.9 oz (76.4 kg)     PHYSICAL EXAM: General:  Well appearing. No respiratory difficulty HEENT: normal Neck: supple. no JVD. Carotids 2+ bilat; no bruits. No thyromegaly or nodule noted Cor: PMI nondisplaced. RRR. No M/G/R Lungs: CTAB, normal effort.  Abdomen: soft, nontender, nondistended. No hepatosplenomegaly. No bruits or masses. Good bowel sounds. Extremities: no cyanosis, clubbing, rash, edema Neuro: alert & oriented x 3, cranial nerves grossly intact. moves all 4 extremities w/o difficulty. Affect pleasant.  ECG: NSR 69 bpm, NSR, LAD, LBBB  ASSESSMENT & PLAN:  1. Chronic systolic CHF: NYHA class II.  EF 20-25% ? 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  - Continue lasix 20 mg prn for weight gain.  - Continue losartan 25 mg qhs - Continue spironolactone 12.5 mg.  2. Elevated LFTs - Trended down in hospital, will recheck CMET today.  3. Paroxysmal atrial flutter - Think this was likely 2/2 dobutamine. - NSR on EKG today. - Decrease amio to 200 mg.  4. ETOH abuse - Encouraged to continue to limit alcohol with complete abstinence preferred.  CMET today. No up-titration with soft BP. Decrease amio. Follow up PA/NP side x 2 weeks. Will  follow up with MD after that.   Casimiro Needle [address] [place], PA-C 04/16/2016 10:45 AM

## 2016-04-16 ENCOUNTER — Ambulatory Visit (HOSPITAL_COMMUNITY)
Admit: 2016-04-16 | Discharge: 2016-04-16 | Disposition: A | Discharge status: Home / Self Care | Payer: 59 | Source: Ambulatory Visit | Attending: Adult Health | Admitting: Adult Health

## 2016-04-16 VITALS — BP 92/70 | HR 86 | Wt 156.6 lb

## 2016-04-16 DIAGNOSIS — Z79899 Other long term (current) drug therapy: Secondary | ICD-10-CM | POA: Insufficient documentation

## 2016-04-16 DIAGNOSIS — R945 Abnormal results of liver function studies: Secondary | ICD-10-CM

## 2016-04-16 DIAGNOSIS — R7989 Other specified abnormal findings of blood chemistry: Secondary | ICD-10-CM | POA: Diagnosis not present

## 2016-04-16 DIAGNOSIS — Z7901 Long term (current) use of anticoagulants: Secondary | ICD-10-CM | POA: Diagnosis not present

## 2016-04-16 DIAGNOSIS — Z8249 Family history of ischemic heart disease and other diseases of the circulatory system: Secondary | ICD-10-CM | POA: Insufficient documentation

## 2016-04-16 DIAGNOSIS — I4892 Unspecified atrial flutter: Secondary | ICD-10-CM | POA: Diagnosis not present

## 2016-04-16 DIAGNOSIS — J449 Chronic obstructive pulmonary disease, unspecified: Secondary | ICD-10-CM | POA: Insufficient documentation

## 2016-04-16 DIAGNOSIS — Z87891 Personal history of nicotine dependence: Secondary | ICD-10-CM | POA: Diagnosis not present

## 2016-04-16 DIAGNOSIS — I5021 Acute systolic (congestive) heart failure: Secondary | ICD-10-CM

## 2016-04-16 DIAGNOSIS — I5022 Chronic systolic (congestive) heart failure: Secondary | ICD-10-CM

## 2016-04-16 DIAGNOSIS — I2510 Atherosclerotic heart disease of native coronary artery without angina pectoris: Secondary | ICD-10-CM | POA: Diagnosis not present

## 2016-04-16 DIAGNOSIS — F1010 Alcohol abuse, uncomplicated: Secondary | ICD-10-CM

## 2016-04-16 LAB — COMPREHENSIVE METABOLIC PANEL
ALT: 133 U/L — ABNORMAL HIGH (ref 17–63)
AST: 48 U/L — ABNORMAL HIGH (ref 15–41)
Albumin: 3.5 g/dL (ref 3.5–5.0)
Alkaline Phosphatase: 71 U/L (ref 38–126)
Anion gap: 7 (ref 5–15)
BUN: 13 mg/dL (ref 6–20)
CO2: 27 mmol/L (ref 22–32)
Calcium: 9.6 mg/dL (ref 8.9–10.3)
Chloride: 105 mmol/L (ref 101–111)
Creatinine, Ser: 1.06 mg/dL (ref 0.61–1.24)
GFR calc Af Amer: >60 mL/min (ref >60)
GFR calc non Af Amer: >60 mL/min (ref >60)
Glucose, Bld: 99 mg/dL (ref 65–99)
Potassium: 4.7 mmol/L (ref 3.5–5.1)
Sodium: 139 mmol/L (ref 135–145)
Total Bilirubin: 1.1 mg/dL (ref 0.3–1.2)
Total Protein: 7.6 g/dL (ref 6.5–8.1)

## 2016-04-16 MED ORDER — AMIODARONE HCL 200 MG PO TABS: 200.0000 mg | ORAL_TABLET | Freq: Every day | ORAL | Status: DC

## 2016-04-16 NOTE — Patient Instructions (Signed)
DECREASE Amiodarone to 200 mg, one tab daily  Your physician recommends that you schedule a follow-up appointment in: 2 weeks In the [facility] and in 6 weeks with Dr.McLean  Do the following things EVERYDAY: 1) Weigh yourself in the morning before breakfast. Write it down and keep it in a log. 2) Take your medicines as prescribed 3) Eat low salt foods-Limit salt (sodium) to 2000 mg per day.  4) Stay as active as you can everyday 5) Limit all fluids for the day to less than 2 liters 6)

## 2016-04-17 ENCOUNTER — Telehealth (HOSPITAL_COMMUNITY): Payer: Self-pay | Admitting: Pharmacist

## 2016-04-17 NOTE — Telephone Encounter (Signed)
Eliquis 5 mg BID PA approved by [facility] through 04/08/17.   Tyler Deis. Bonnye Fava, PharmD, BCPS, CPP Clinical Pharmacist Pager: [phone] Phone: [phone] 04/17/2016 4:07 PM

## 2016-04-21 ENCOUNTER — Encounter (HOSPITAL_COMMUNITY): Payer: Self-pay

## 2016-04-21 NOTE — Progress Notes (Signed)
Short term disability paperwork received, completed, and pending signature from Dr. Gala Romney. Will fax as soon as signed by MD.  Ave Filter

## 2016-04-28 ENCOUNTER — Telehealth (HOSPITAL_COMMUNITY): Payer: Self-pay | Admitting: *Deleted

## 2016-04-28 NOTE — Telephone Encounter (Signed)
[employer] called requesting disability paperwork. I see a phone note from [facility] on 4/24 stating she was waiting for a signature from Dr.Bensimhon. I told Monia Pouch rep that i would look for paperwork but Aundra Millet does our disability and it out of the office until tomorrow. If I located paperwork and it was complete I would fax today otherwise Aundra Millet would fax the paperwork out tomorrow. I did not locate the paperwork will follow up with Megan on 5/2  Fax number to submit disability paperwork [phone]

## 2016-04-29 NOTE — Progress Notes (Addendum)
Patient ID: [facility], male   DOB: [birth year], 60 y.o.   MRN: [id number]    Advanced Heart Failure Clinic Note    [facility] Care: [facility] Primary HF: Dr. Shirlee Barnes   HPI:  [facility] is a 60 y.o. male with h/o tobacco abuse and ETOH abuse. Who presented to [facility] on 03/31/16 with SOB. BNP and LFTs were elevated. Was found to be in Aflutter with RVR with 2:1 conduction. Echo 03/31/16 with reduced EF to 20-25%, mild Caleb, mild/mod reduced RV, Peak PA systolic 43 mm Hg. CTA chest with no PE. He was also placed on CIWA protocol for extensive ETOH use as an outpatient. GI consulted with elevated LFTs at [facility]. Abd [place] with gall bladder thickening but normal-appearing liver. He was started on amiodarone drip for aflutter. Diuresed with IV lasix 40 mg BID  He was started on Dobutamine and Dopamine drips with low urine output on 04/02/16. Was transferred to [facility] for further evaluation and treatment with worsening condition. He was weaned off both dopamine and dobutamine in CCU and went back into NSR.  [facility] 04/04/16 with low CO, mildly elevated filling pressures, and mild nonobstructive CAD. (Full report below) Started on milrinone with low cardiac output. 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 With ETOH history, poor candidate for advanced therapies. He tolerated milrinone wean and was discharge to home without inotropic support.   He presents today for regular follow up. Hasn't had any problem with breathing since last visit. Weight up 3 lbs since last visit. Can walk as long as he wants on flat ground. No problem with stairs or incline. Denies palpitations. No CP, orthopnea/PND, no bendopnea. Seems like he's sleeping better at night since starting on Melatonin.  Weight at home 151-153 lbs. Feels like he is eating better. Has abstained from alcohol completely. He would like to return to work.   PMH 1. Systolic CHF - ?etiology. Possibilities include tachy-mediated CMP with atrial  fib/RVR of unknown duration and ETOH CMP. No CAD on coronary angiography today.  R/LHC 04/04/16  Right Heart Pressures Mild Non-obstructive CAD (30% mild LAD stenosis, otherwise luminal irregularities only) RHC Procedural Findings: Hemodynamics (mmHg) RA mean 7 RV 43/9 PA 43/25, mean 32 PCWP mean 21 LV 83/20 AO 84/58 Oxygen saturations: PA 46% AO 95% Cardiac Output (Fick) 2.67 Cardiac Index (Fick) 1.41 Cardiac Output (Thermo) 3.38 Cardiac Index (Thermo) 1.78 2. PAF/PAFL - Chemically converted with amiodarone, thought to be 2/2 dobutamine.  3. ETOH abuse   Past Medical History  Diagnosis Date  . COPD (chronic obstructive pulmonary disease) (HCC)   . CHF (congestive heart failure) (HCC)   . Polysubstance abuse   . Shortness of breath dyspnea   . Anxiety   . Acute renal failure [facility])     Current Outpatient Prescriptions  Medication Sig Dispense Refill  . albuterol (PROVENTIL HFA;VENTOLIN HFA) 108 (90 Base) MCG/ACT inhaler Inhale 1 puff into the lungs every 4 (four) hours as needed for wheezing or shortness of breath.    Marland Kitchen amiodarone (PACERONE) 200 MG tablet Take 1 tablet (200 mg total) by mouth daily. 30 tablet 6  . apixaban (ELIQUIS) 5 MG TABS tablet Take 1 tablet (5 mg total) by mouth 2 (two) times daily. 60 tablet 6  . digoxin (LANOXIN) 0.125 MG tablet Take 1 tablet (0.125 mg total) by mouth daily. 30 tablet 6  . furosemide (LASIX) 20 MG tablet Take 1 tablet (20 mg total) by mouth as needed. For swelling or weight  gain of 3 lbs overnight or 5 lbs within one week. 30 tablet 6  . losartan (COZAAR) 25 MG tablet Take 1 tablet (25 mg total) by mouth at bedtime. 30 tablet 6  . spironolactone (ALDACTONE) 25 MG tablet Take 0.5 tablets (12.5 mg total) by mouth daily. 30 tablet 6  . tiotropium (SPIRIVA) 18 MCG inhalation capsule Place 18 mcg into inhaler and inhale daily.     No current facility-administered medications for this encounter.    No Known Allergies    Social History    Social History  . Marital Status: Divorced    Spouse Name: N/A  . Number of Children: N/A  . Years of Education: N/A   Occupational History  . Not on file.   Social History Main Topics  . Smoking status: Former Games developer  . Smokeless tobacco: Never Used  . Alcohol Use: No  . Drug Use: No  . Sexual Activity: Not on file   Other Topics Concern  . Not on file   Social History Narrative      Family History  Problem Relation Age of Onset  . CAD Father     [facility] Vitals:   04/30/16 1005  BP: 122/84  Pulse: 78  Weight: 159 lb 6.4 oz (72.303 kg)  SpO2: 99%   Wt Readings from Last 3 Encounters:  04/30/16 159 lb 6.4 oz (72.303 kg)  04/16/16 156 lb 9.6 oz (71.033 kg)  04/08/16 152 lb (68.947 kg)     PHYSICAL EXAM: General:  Well appearing. No respiratory difficulty HEENT: normal Neck: supple. JVD 6-7 cm. Carotids 2+ bilat; no bruits. No thyromegaly or lymphadenopathy noted Cor: PMI nondisplaced. RRR. No M/G/R Lungs: Clear Abdomen: soft, non-tender, non-distended, no HSM. No bruits or masses. +BS  Extremities: no cyanosis, clubbing, rash, edema Neuro: alert & oriented x 3, cranial nerves grossly intact. moves all 4 extremities w/o difficulty. Affect pleasant.  ASSESSMENT & PLAN:  1. Chronic systolic CHF: NYHA class II.  EF 20-25% ? 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  - Continue lasix 20 mg prn for weight gain.  - Start coreg 3.125 mg BID.   - Continue losartan 25 mg qhs. CMET today.  - Continue spironolactone 12.5 mg.  2. Elevated LFTs - Continued to trend down at last visit, recheck CMET today.  3. Paroxysmal atrial flutter - Think this was likely 2/2 dobutamine. - NSR on exam.  - Continue amio to 200 mg.  4. ETOH abuse - Has stopped completely. Congratulated on his abstinence.  5. Out-of-Work - [facility] is cleared to return to work on 05/05/16. Discussed with Dr. Shirlee Barnes. Pt given note to take to work.   CMET today. Med  changes as above. Follow up with MD scheduled for 05/27/16.  Caleb Needle "Mardelle Matte" [place], PA-C 04/30/2016 10:07 AM     Total time spent > 25 minutes, over half that discussing the above.

## 2016-04-30 ENCOUNTER — Encounter (HOSPITAL_COMMUNITY): Payer: Self-pay | Admitting: Cardiology

## 2016-04-30 ENCOUNTER — Encounter (HOSPITAL_COMMUNITY): Payer: Self-pay

## 2016-04-30 ENCOUNTER — Ambulatory Visit (HOSPITAL_COMMUNITY)
Admission: RE | Admit: 2016-04-30 | Discharge: 2016-04-30 | Disposition: A | Discharge status: Home / Self Care | Payer: 59 | Source: Ambulatory Visit | Attending: Cardiology | Admitting: Cardiology

## 2016-04-30 VITALS — BP 122/84 | HR 78 | Wt 159.4 lb

## 2016-04-30 DIAGNOSIS — F1010 Alcohol abuse, uncomplicated: Secondary | ICD-10-CM | POA: Diagnosis not present

## 2016-04-30 DIAGNOSIS — J449 Chronic obstructive pulmonary disease, unspecified: Secondary | ICD-10-CM | POA: Diagnosis not present

## 2016-04-30 DIAGNOSIS — I5022 Chronic systolic (congestive) heart failure: Secondary | ICD-10-CM | POA: Diagnosis not present

## 2016-04-30 DIAGNOSIS — Z87891 Personal history of nicotine dependence: Secondary | ICD-10-CM | POA: Diagnosis not present

## 2016-04-30 DIAGNOSIS — Z8249 Family history of ischemic heart disease and other diseases of the circulatory system: Secondary | ICD-10-CM | POA: Diagnosis not present

## 2016-04-30 DIAGNOSIS — R7989 Other specified abnormal findings of blood chemistry: Secondary | ICD-10-CM | POA: Diagnosis not present

## 2016-04-30 DIAGNOSIS — Z7901 Long term (current) use of anticoagulants: Secondary | ICD-10-CM | POA: Insufficient documentation

## 2016-04-30 DIAGNOSIS — I2510 Atherosclerotic heart disease of native coronary artery without angina pectoris: Secondary | ICD-10-CM | POA: Insufficient documentation

## 2016-04-30 DIAGNOSIS — R945 Abnormal results of liver function studies: Secondary | ICD-10-CM

## 2016-04-30 DIAGNOSIS — I4892 Unspecified atrial flutter: Secondary | ICD-10-CM

## 2016-04-30 DIAGNOSIS — Z79899 Other long term (current) drug therapy: Secondary | ICD-10-CM | POA: Diagnosis not present

## 2016-04-30 LAB — COMPREHENSIVE METABOLIC PANEL
ALT: 37 U/L (ref 17–63)
AST: 31 U/L (ref 15–41)
Albumin: 3.6 g/dL (ref 3.5–5.0)
Alkaline Phosphatase: 58 U/L (ref 38–126)
Anion gap: 7 (ref 5–15)
BUN: 8 mg/dL (ref 6–20)
CO2: 23 mmol/L (ref 22–32)
Calcium: 9.3 mg/dL (ref 8.9–10.3)
Chloride: 106 mmol/L (ref 101–111)
Creatinine, Ser: 0.98 mg/dL (ref 0.61–1.24)
GFR calc Af Amer: >60 mL/min (ref >60)
GFR calc non Af Amer: >60 mL/min (ref >60)
Glucose, Bld: 118 mg/dL — ABNORMAL HIGH (ref 65–99)
Potassium: 4.3 mmol/L (ref 3.5–5.1)
Sodium: 136 mmol/L (ref 135–145)
Total Bilirubin: 0.5 mg/dL (ref 0.3–1.2)
Total Protein: 6.6 g/dL (ref 6.5–8.1)

## 2016-04-30 MED ORDER — CARVEDILOL 3.125 MG PO TABS: 3.1250 mg | ORAL_TABLET | Freq: Two times a day (BID) | ORAL | Status: DC

## 2016-04-30 NOTE — Patient Instructions (Signed)
START Carvedilol 3.125mg  , one tab twice a day  Labs today  Your physician recommends that you schedule a follow-up appointment in: 4 weeks with Dr.McLean  Do the following things EVERYDAY: 1) Weigh yourself in the morning before breakfast. Write it down and keep it in a log. 2) Take your medicines as prescribed 3) Eat low salt foods-Limit salt (sodium) to 2000 mg per day.  4) Stay as active as you can everyday 5) Limit all fluids for the day to less than 2 liters 6)

## 2016-05-02 ENCOUNTER — Encounter (HOSPITAL_COMMUNITY): Payer: Self-pay

## 2016-05-02 NOTE — Progress Notes (Signed)
[employer] physician statement completed on behalf of patient for disability claim. Forms with necessary supporting documents/notes/results faxed to provided # [phone] Copy of forms scanned into patient's electronic medical record.  Ave Filter

## 2016-05-05 ENCOUNTER — Telehealth (HOSPITAL_COMMUNITY): Payer: Self-pay

## 2016-05-05 NOTE — Telephone Encounter (Signed)
Edwina Barth with [employer] called to reference to [employer] paperwork on behalf of patient. Per our records, Monia Pouch FMLA paperwork was completed and faxed last week. Left return VM for Becky that these papers were faxed and to return our call if these papers needed to be faxed to alternate number. Also, company needing clarification on return to work letter we sent last week stating "allow adequate time to return to full duties, rest breaks as needed". Asked also on return VM to specify what needs to be said in return to work note.  Ave Filter

## 2016-05-06 ENCOUNTER — Telehealth (HOSPITAL_COMMUNITY): Payer: Self-pay

## 2016-05-06 NOTE — Telephone Encounter (Signed)
Spoke with [employer]'s employer HR rep Edwina Barth concerning patient's current return to work status. CHF clinic had recently released him abck to work with lifting restrictions and rest breaks as needed, however, Kriste Basque endorses that they are unable to accomodate any sort of restrictions or light duty.  Patient must either stay out completely or be released full duty without restrictions. In speaking with Dr. Shirlee Latch, will update forms to keep patient out of work until seen by MD on 05/27/2016. Certification of Health care Provider for Employee's Serious Health Condition forms updated and faxed to provided # [phone] [facility] and patient made aware of these changes and of forms being faxed.  Updated forms scanned into patient's electronic medical record. If patient is able to return to work after 05/27/2016, will fax in a return to work note for FULL WORK DUTY without restriction to [phone] Attn: becky kirby.  Ave Filter

## 2016-05-27 ENCOUNTER — Encounter (HOSPITAL_COMMUNITY): Payer: 59

## 2016-05-27 ENCOUNTER — Encounter (HOSPITAL_COMMUNITY): Payer: Self-pay | Admitting: *Deleted

## 2016-05-27 ENCOUNTER — Encounter (HOSPITAL_COMMUNITY): Payer: Self-pay

## 2016-05-27 ENCOUNTER — Ambulatory Visit (HOSPITAL_COMMUNITY)
Admission: RE | Admit: 2016-05-27 | Discharge: 2016-05-27 | Disposition: A | Discharge status: Home / Self Care | Payer: 59 | Source: Ambulatory Visit | Attending: Cardiology | Admitting: Cardiology

## 2016-05-27 VITALS — BP 129/63 | HR 70 | Resp 18 | Wt 161.5 lb

## 2016-05-27 DIAGNOSIS — Z8249 Family history of ischemic heart disease and other diseases of the circulatory system: Secondary | ICD-10-CM | POA: Diagnosis not present

## 2016-05-27 DIAGNOSIS — Z7901 Long term (current) use of anticoagulants: Secondary | ICD-10-CM | POA: Diagnosis not present

## 2016-05-27 DIAGNOSIS — R197 Diarrhea, unspecified: Secondary | ICD-10-CM | POA: Insufficient documentation

## 2016-05-27 DIAGNOSIS — I428 Other cardiomyopathies: Secondary | ICD-10-CM | POA: Diagnosis not present

## 2016-05-27 DIAGNOSIS — Z79899 Other long term (current) drug therapy: Secondary | ICD-10-CM | POA: Diagnosis not present

## 2016-05-27 DIAGNOSIS — I5022 Chronic systolic (congestive) heart failure: Secondary | ICD-10-CM | POA: Insufficient documentation

## 2016-05-27 DIAGNOSIS — I480 Paroxysmal atrial fibrillation: Secondary | ICD-10-CM | POA: Diagnosis not present

## 2016-05-27 DIAGNOSIS — F1010 Alcohol abuse, uncomplicated: Secondary | ICD-10-CM | POA: Diagnosis not present

## 2016-05-27 DIAGNOSIS — Z87891 Personal history of nicotine dependence: Secondary | ICD-10-CM | POA: Diagnosis not present

## 2016-05-27 DIAGNOSIS — J449 Chronic obstructive pulmonary disease, unspecified: Secondary | ICD-10-CM | POA: Diagnosis not present

## 2016-05-27 LAB — BASIC METABOLIC PANEL
Anion gap: 7 (ref 5–15)
BUN: 10 mg/dL (ref 6–20)
CO2: 28 mmol/L (ref 22–32)
Calcium: 9.5 mg/dL (ref 8.9–10.3)
Chloride: 103 mmol/L (ref 101–111)
Creatinine, Ser: 1.07 mg/dL (ref 0.61–1.24)
GFR calc Af Amer: >60 mL/min (ref >60)
GFR calc non Af Amer: >60 mL/min (ref >60)
Glucose, Bld: 115 mg/dL — ABNORMAL HIGH (ref 65–99)
Potassium: 4.2 mmol/L (ref 3.5–5.1)
Sodium: 138 mmol/L (ref 135–145)

## 2016-05-27 LAB — DIGOXIN LEVEL: Digoxin Level: 0.9 ng/mL (ref 0.8–2.0)

## 2016-05-27 LAB — BRAIN NATRIURETIC PEPTIDE: B Natriuretic Peptide: 170.7 pg/mL — ABNORMAL HIGH (ref 0.0–100.0)

## 2016-05-27 LAB — TSH: TSH: 2.071 u[IU]/mL (ref 0.350–4.500)

## 2016-05-27 MED ORDER — SACUBITRIL-VALSARTAN 24-26 MG PO TABS: 1.0000 | ORAL_TABLET | Freq: Two times a day (BID) | ORAL | Status: DC

## 2016-05-27 MED ORDER — BISOPROLOL FUMARATE 5 MG PO TABS: 2.5000 mg | ORAL_TABLET | Freq: Every day | ORAL | Status: DC

## 2016-05-27 NOTE — Progress Notes (Signed)
Patient ID: Caleb Barnes, male   DOB: 12-13-1956, 60 y.o.   MRN: [id number]    Advanced Heart Failure Clinic Note    Primary Care: Dr. Silver Huguenin Primary HF: Dr. Shirlee Latch   HPI:  Caleb Barnes is a 60 y.o. male with h/o tobacco abuse and ETOH abuse. Who presented to [facility] ED on 03/31/16 with SOB. BNP and LFTs were elevated. Was found to be in atrial fibrillation with RVR. Echo 03/31/16 with reduced EF to 20-25%, mild MR, mild/mod reduced RV systolic function, Peak PA systolic 43 mm Hg. CTA chest with no PE. He was also placed on CIWA protocol for extensive ETOH use as an outpatient. GI consulted with elevated LFTs at [facility]. Abd [place] with gallbladder thickening but normal-appearing liver. He was started on amiodarone drip for atrial fibrillation. Diuresed with IV lasix 40 mg BID.  He was started on Dobutamine and Dopamine drips. Was transferred to [facility] for further evaluation and treatment with worsening condition. He was weaned off both dopamine and dobutamine in CCU and went back into NSR.  [facility] 04/04/16 showed low CO, mildly elevated filling pressures, and mild nonobstructive CAD. Started on milrinone with low cardiac output. 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 With ETOH history, poor candidate for advanced therapies. He tolerated milrinone wean and was discharged to home without inotropic support and in NSR.   He presents for followup today.  He remains in NSR.  He has done well since discharge.  He has quit drinking completely.  Weight is stable.  No exertional dyspnea, orthopnea/PND, chest pain, or lightheadedness.  No palpitations.  He is active and does his yardwork.  Wants to go back to work.  He has had diarrhea ever since starting low dose Coreg and wants to stop it.   ECG: NSR, LVH with repolarization abnormality, QRS 122 msec.   Labs (4/17): HCT 46.1 Labs (5/17): K 4.3, creatinine 0.98, LFTs normal  PMH 1. Chronic  systolic CHF: Nonischemic cardiomyopathy. Possible etiologies include tachy-mediated CMP with atrial fib/RVR of unknown duration and ETOH CMP. - R/LHC (4/17): Mild nonobstructive CAD; mean RA 7, PA 43/25, mean PCWP 21, CI 1.41 Fick and 1.78 thermodilution.  - Echo (4/17): EF < 20%, moderate LV dilation, mid to moderately decreased RV systolic function.  2. Atrial fibrillation: Paroxysmal. He is on amiodarone.  3. ETOH abuse: Has quit.  4. COPD   Current Outpatient Prescriptions  Medication Sig Dispense Refill  . albuterol (PROVENTIL HFA;VENTOLIN HFA) 108 (90 Base) MCG/ACT inhaler Inhale 1 puff into the lungs every 4 (four) hours as needed for wheezing or shortness of breath.    Marland Kitchen amiodarone (PACERONE) 200 MG tablet Take 1 tablet (200 mg total) by mouth daily. 30 tablet 6  . apixaban (ELIQUIS) 5 MG TABS tablet Take 1 tablet (5 mg total) by mouth 2 (two) times daily. 60 tablet 6  . digoxin (LANOXIN) 0.125 MG tablet Take 1 tablet (0.125 mg total) by mouth daily. 30 tablet 6  . furosemide (LASIX) 20 MG tablet Take 1 tablet (20 mg total) by mouth as needed. For swelling or weight gain of 3 lbs overnight or 5 lbs within one week. 30 tablet 6  . spironolactone (ALDACTONE) 25 MG tablet Take 0.5 tablets (12.5 mg total) by mouth daily. 30 tablet 6  . tiotropium (SPIRIVA) 18 MCG inhalation capsule Place 18 mcg into inhaler and inhale daily.    . bisoprolol (ZEBETA) 5 MG tablet  Take 0.5 tablets (2.5 mg total) by mouth daily. 15 tablet 3  . sacubitril-valsartan (ENTRESTO) 24-26 MG Take 1 tablet by mouth 2 (two) times daily. 60 tablet 6   No current facility-administered medications for this encounter.    No Known Allergies    Social History   Social History  . Marital Status: Divorced    Spouse Name: N/A  . Number of Children: N/A  . Years of Education: N/A   Occupational History  . Not on file.   Social History Main Topics  . Smoking status: Former Games developer  . Smokeless tobacco: Never Used   . Alcohol Use: No  . Drug Use: No  . Sexual Activity: Not on file   Other Topics Concern  . Not on file   Social History Narrative      Family History  Problem Relation Age of Onset  . CAD Father     Caleb Barnes Vitals:   05/27/16 1137  BP: 129/63  Pulse: 70  Resp: 18  Weight: 161 lb 8 oz (73.256 kg)  SpO2: 98%   Wt Readings from Last 3 Encounters:  05/27/16 161 lb 8 oz (73.256 kg)  04/30/16 159 lb 6.4 oz (72.303 kg)  04/16/16 156 lb 9.6 oz (71.033 kg)     PHYSICAL EXAM: General:  Well appearing. No respiratory difficulty HEENT: normal Neck: supple. JVD 6-7 cm. Carotids 2+ bilat; no bruits. No thyromegaly or lymphadenopathy noted Cor: PMI nondisplaced. RRR. No M/G/R Lungs: Clear Abdomen: soft, non-tender, non-distended, no HSM. No bruits or masses. +BS  Extremities: no cyanosis, clubbing, rash, edema Neuro: alert & oriented x 3, cranial nerves grossly intact. moves all 4 extremities w/o difficulty. Affect pleasant.  ASSESSMENT & PLAN:  1. Chronic systolic CHF: Nonischemic cardiomyopathy.  I suspect that this was a combination of tachy-mediated CMP + ETOH CMP.  Hopefully, function will improve now that he is remaining in NSR and has quit ETOH.  Currently NYHA class I-II.  He is not volume overloaded on exam.  - Has had diarrhea since starting Coreg.  Will let him stop this medication and will start bisoprolol 2.5 mg daily to replace it.   - Stop losartan, start Entresto 24/26 bid.  BMET 10 days.  - He has not had to use any Lasix recently, has it available for prn use.  - Continue spironolactone 12.5 daily.  - Continue digoxin 0.125 daily.  Check level.  - Repeat echo in 8/17 for ICD evaluation. QRS not particularly wide, so would not be CRT candidate.  2. Atrial fibrillation: Paroxysmal.  He is in NSR on amiodarone and apixaban.  - With amiodarone use, will check TSH today. He had recent LFTs that were normal.  He will need regular eye exam.  3. ETOH abuse: He says that  he has quit completely.   I will write him a note to let him go back to work next week, he seems to be doing quite well.   [name] 05/27/2016

## 2016-05-27 NOTE — Patient Instructions (Addendum)
Stop Losartan  Start Entresto 24/26 mg Twice daily   Stop Carvedilol  Start Bisoprolol 2.5 mg (1/2 tab) daily  Labs today  Labs in 10 days  Your physician recommends that you schedule a follow-up appointment in: 1 month

## 2016-05-29 ENCOUNTER — Encounter (HOSPITAL_COMMUNITY): Payer: Self-pay | Admitting: *Deleted

## 2016-05-30 ENCOUNTER — Telehealth (HOSPITAL_COMMUNITY): Payer: Self-pay | Admitting: *Deleted

## 2016-05-30 NOTE — Telephone Encounter (Signed)
Completed PA for pt's Entresto 24/26 mg, med approved 05/28/16-05/28/17, [facility] aware

## 2016-06-02 ENCOUNTER — Telehealth (HOSPITAL_COMMUNITY): Payer: Self-pay | Admitting: *Deleted

## 2016-06-02 NOTE — Telephone Encounter (Signed)
Received signed ROI from [facility] requesting pt's RTW date and last OV note, pt's last OV along w/6/1 RTW note faxed to [facility] [phone]

## 2016-06-06 ENCOUNTER — Ambulatory Visit (HOSPITAL_COMMUNITY)
Admission: RE | Admit: 2016-06-06 | Discharge: 2016-06-06 | Disposition: A | Discharge status: Home / Self Care | Payer: 59 | Source: Ambulatory Visit | Attending: Cardiology | Admitting: Cardiology

## 2016-06-06 DIAGNOSIS — I5022 Chronic systolic (congestive) heart failure: Secondary | ICD-10-CM | POA: Insufficient documentation

## 2016-06-06 LAB — BASIC METABOLIC PANEL
Anion gap: 6 (ref 5–15)
BUN: 11 mg/dL (ref 6–20)
CO2: 26 mmol/L (ref 22–32)
Calcium: 9.5 mg/dL (ref 8.9–10.3)
Chloride: 105 mmol/L (ref 101–111)
Creatinine, Ser: 1.14 mg/dL (ref 0.61–1.24)
GFR calc Af Amer: >60 mL/min (ref >60)
GFR calc non Af Amer: >60 mL/min (ref >60)
Glucose, Bld: 96 mg/dL (ref 65–99)
Potassium: 4.4 mmol/L (ref 3.5–5.1)
Sodium: 137 mmol/L (ref 135–145)

## 2016-06-26 ENCOUNTER — Encounter (HOSPITAL_COMMUNITY): Payer: 59

## 2016-07-03 ENCOUNTER — Encounter (HOSPITAL_COMMUNITY): Payer: Self-pay

## 2016-07-03 NOTE — Progress Notes (Signed)
[employer] medical record request received to CHF clinic dated 05/30/16. All medical records 04/30/16 -- present faxed to provided # [phone] as requested (24 total pages). Copy of request scanned into patient's electronic medical record. Claim # [id number]  [employer]

## 2016-07-08 ENCOUNTER — Ambulatory Visit (HOSPITAL_COMMUNITY)
Admission: RE | Admit: 2016-07-08 | Discharge: 2016-07-08 | Disposition: A | Discharge status: Home / Self Care | Payer: 59 | Source: Ambulatory Visit | Attending: Cardiology | Admitting: Cardiology

## 2016-07-08 VITALS — BP 120/56 | HR 65 | Wt 163.6 lb

## 2016-07-08 DIAGNOSIS — I4891 Unspecified atrial fibrillation: Secondary | ICD-10-CM

## 2016-07-08 DIAGNOSIS — Z87891 Personal history of nicotine dependence: Secondary | ICD-10-CM | POA: Diagnosis not present

## 2016-07-08 DIAGNOSIS — Z7901 Long term (current) use of anticoagulants: Secondary | ICD-10-CM | POA: Diagnosis not present

## 2016-07-08 DIAGNOSIS — I480 Paroxysmal atrial fibrillation: Secondary | ICD-10-CM | POA: Diagnosis not present

## 2016-07-08 DIAGNOSIS — F1010 Alcohol abuse, uncomplicated: Secondary | ICD-10-CM | POA: Insufficient documentation

## 2016-07-08 DIAGNOSIS — R42 Dizziness and giddiness: Secondary | ICD-10-CM | POA: Insufficient documentation

## 2016-07-08 DIAGNOSIS — I429 Cardiomyopathy, unspecified: Secondary | ICD-10-CM | POA: Insufficient documentation

## 2016-07-08 DIAGNOSIS — R197 Diarrhea, unspecified: Secondary | ICD-10-CM | POA: Diagnosis not present

## 2016-07-08 DIAGNOSIS — J449 Chronic obstructive pulmonary disease, unspecified: Secondary | ICD-10-CM | POA: Diagnosis not present

## 2016-07-08 DIAGNOSIS — I2510 Atherosclerotic heart disease of native coronary artery without angina pectoris: Secondary | ICD-10-CM | POA: Insufficient documentation

## 2016-07-08 DIAGNOSIS — I5022 Chronic systolic (congestive) heart failure: Secondary | ICD-10-CM | POA: Diagnosis present

## 2016-07-08 MED ORDER — SACUBITRIL-VALSARTAN 49-51 MG PO TABS: 1.0000 | ORAL_TABLET | Freq: Two times a day (BID) | ORAL | Status: DC

## 2016-07-08 NOTE — Patient Instructions (Signed)
INCREASE Entresto to 49/51 mg tablet twice daily.  Return in 1-2 weeks for lab work.  Follow up in August with echocardiogram and appointment with Dr. Shirlee Latch.  Do the following things EVERYDAY: 1) Weigh yourself in the morning before breakfast. Write it down and keep it in a log. 2) Take your medicines as prescribed 3) Eat low salt foods-Limit salt (sodium) to 2000 mg per day.  4) Stay as active as you can everyday 5) Limit all fluids for the day to less than 2 liters

## 2016-07-08 NOTE — Progress Notes (Signed)
Advanced Heart Failure Medication Review by a Pharmacist  Does the patient  feel that his/her medications are working for him/her?  yes  Has the patient been experiencing any side effects to the medications prescribed?  no  Does the patient measure his/her own blood pressure or blood glucose at home?  no   Does the patient have any problems obtaining medications due to transportation or finances?   no  Understanding of regimen: good Understanding of indications: good Potential of compliance: good Patient understands to avoid NSAIDs. Patient understands to avoid decongestants.  Issues to address at subsequent visits: None   Pharmacist comments:  Mr. Jessee is a pleasant 60 yo M presenting without a medication list but able to verbalize regimen to me. He reports good compliance with his regimen and states that he now is using coupon cards for lower copays of his Entresto and Eliquis. He did state that he sometimes has dizziness upon standing quickly but it is not frequent.   Tyler Deis. Bonnye Fava, PharmD, BCPS, CPP Clinical Pharmacist Pager: [phone] Phone: [phone] 07/08/2016 11:43 AM      Time with patient: 10 minutes Preparation and documentation time: 2 minutes Total time: 12 minutes

## 2016-07-08 NOTE — Progress Notes (Signed)
Patient ID: Caleb Barnes, male   DOB: January 28, 1956, 60 y.o.   MRN: [id number]    Advanced Heart Failure Clinic Note    Primary Care: Dr. Silver Huguenin Primary HF: Dr. Shirlee Latch   HPI:  Moyses Schlaff is a 60 y.o. male with h/o tobacco abuse and ETOH abuse. Who presented to [facility] ED on 03/31/16 with SOB. BNP and LFTs were elevated. Was found to be in atrial fibrillation with RVR. Echo 03/31/16 with reduced EF to 20-25%, mild MR, mild/mod reduced RV systolic function, Peak PA systolic 43 mm Hg. CTA chest with no PE. He was also placed on CIWA protocol for extensive ETOH use as an outpatient. GI consulted with elevated LFTs at [facility]. Abd [place] with gallbladder thickening but normal-appearing liver. He was started on amiodarone drip for atrial fibrillation. Diuresed with IV lasix 40 mg BID.  He was started on Dobutamine and Dopamine drips. Was transferred to [facility] for further evaluation and treatment with worsening condition. He was weaned off both dopamine and dobutamine in CCU and went back into NSR.  [facility] 04/04/16 showed low CO, mildly elevated filling pressures, and mild nonobstructive CAD. Started on milrinone with low cardiac output. 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 With ETOH history, poor candidate for advanced therapies. He tolerated milrinone wean and was discharged to home without inotropic support and in NSR.   He presents for regular follow up.  At last visit switched to [facility] and to bisoprolol from Coreg due to diarrhea. Gets mild lightheadedness when he bends over and then stands up quickly.  Breathing is great. Denies any exertional dyspnea. Hasn't need his inhaler. No orthopnea, PND, CP, or palpitations. 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 Has not needed any lasix.  Weight up 2 lbs, but increased activity and appetite. Weight at home 157 lbs.   Labs (4/17): HCT  46.1 Labs (5/17): K 4.3, creatinine 0.98, LFTs normal  PMH 1. Chronic systolic CHF: Nonischemic cardiomyopathy. Possible etiologies include tachy-mediated CMP with atrial fib/RVR of unknown duration and ETOH CMP. - R/LHC (4/17): Mild nonobstructive CAD; mean RA 7, PA 43/25, mean PCWP 21, CI 1.41 Fick and 1.78 thermodilution.  - Echo (4/17): EF < 20%, moderate LV dilation, mid to moderately decreased RV systolic function.  2. Atrial fibrillation: Paroxysmal. He is on amiodarone.  3. ETOH abuse: Has quit.  4. COPD   Current Outpatient Prescriptions  Medication Sig Dispense Refill  . acetaminophen (TYLENOL) 325 MG tablet Take 650 mg by mouth every 6 (six) hours as needed for mild pain.    Marland Kitchen amiodarone (PACERONE) 200 MG tablet Take 1 tablet (200 mg total) by mouth daily. 30 tablet 6  . apixaban (ELIQUIS) 5 MG TABS tablet Take 1 tablet (5 mg total) by mouth 2 (two) times daily. 60 tablet 6  . bisoprolol (ZEBETA) 5 MG tablet Take 0.5 tablets (2.5 mg total) by mouth daily. 15 tablet 3  . digoxin (LANOXIN) 0.125 MG tablet Take 1 tablet (0.125 mg total) by mouth daily. 30 tablet 6  . sacubitril-valsartan (ENTRESTO) 24-26 MG Take 1 tablet by mouth 2 (two) times daily. 60 tablet 6  . spironolactone (ALDACTONE) 25 MG tablet Take 0.5 tablets (12.5 mg total) by mouth daily. 30 tablet 6  . tiotropium (SPIRIVA) 18 MCG inhalation capsule Place 18 mcg into inhaler and inhale daily.    Marland Kitchen albuterol (PROVENTIL HFA;VENTOLIN  HFA) 108 (90 Base) MCG/ACT inhaler Inhale 1 puff into the lungs every 4 (four) hours as needed for wheezing or shortness of breath. Reported on 07/08/2016    . furosemide (LASIX) 20 MG tablet Take 1 tablet (20 mg total) by mouth as needed. For swelling or weight gain of 3 lbs overnight or 5 lbs within one week. (Patient not taking: Reported on 07/08/2016) 30 tablet 6   No current facility-administered medications for this encounter.    No Known Allergies    Social History   Social  History  . Marital Status: Divorced    Spouse Name: N/A  . Number of Children: N/A  . Years of Education: N/A   Occupational History  . Not on file.   Social History Main Topics  . Smoking status: Former Games developer  . Smokeless tobacco: Never Used  . Alcohol Use: No  . Drug Use: No  . Sexual Activity: Not on file   Other Topics Concern  . Not on file   Social History Narrative      Family History  Problem Relation Age of Onset  . CAD Father     Filed Vitals:   07/08/16 1108  BP: 120/56  Pulse: 65  Weight: 163 lb 9.6 oz (74.208 kg)  SpO2: 98%   Wt Readings from Last 3 Encounters:  07/08/16 163 lb 9.6 oz (74.208 kg)  05/27/16 161 lb 8 oz (73.256 kg)  04/30/16 159 lb 6.4 oz (72.303 kg)     PHYSICAL EXAM: General:  Well appearing. NAD HEENT: normal Neck: supple. JVD flat cm. Carotids 2+ bilat; no bruits. No thyromegaly or nodule noted Cor: PMI nondisplaced. RRR. No M/G/R Lungs: CTAB, normal effort Abdomen: soft, NT, ND, no HSM. No bruits or masses. +BS  Extremities: no cyanosis, clubbing, rash, edema Neuro: alert & oriented x 3, cranial nerves grossly intact. moves all 4 extremities w/o difficulty. Affect pleasant.  ASSESSMENT & PLAN:  1. Chronic systolic CHF: Nonischemic cardiomyopathy.  Echo 03/31/16 LVEF 20-25%, RV mild/moderately reduced, PA peak pressure 43 mm Hg.  - Suspect that this was a combination of tachy-mediated CMP + ETOH CMP.  Hopefully, function will improve now that he is remaining in NSR and has quit ETOH.  Currently NYHA class I-II.  He is not volume overloaded on exam.  - Continue bisoprolol 2.5 mg daily. Had diarrhea on coreg.  - Increase Entresto to 49/51 mg BID. With recent stable labs will recheck BMET in 10 days.  - He has not had to use any Lasix recently, has it available for prn use.  - Continue spironolactone 12.5 daily.  - Continue digoxin 0.125 daily.  Check level.  - Repeat echo in 8/17 for ICD evaluation. QRS not particularly wide, so  would not be CRT candidate.  2. Atrial fibrillation: Paroxysmal.  He is in NSR on amiodarone and apixaban.  - Continue amiodarone 200 mg daily. Recent TSH and LFTs stable - He will need regular eye exam. He is overdue.  3. ETOH abuse: He says that he has quit completely.   Increase Entresto. Labs in 10 days.  Follow up 4-6 weeks with Echo on MD side.   Mariam Dollar [facility] 07/08/2016

## 2016-07-25 ENCOUNTER — Ambulatory Visit (HOSPITAL_COMMUNITY)
Admission: RE | Admit: 2016-07-25 | Discharge: 2016-07-25 | Disposition: A | Discharge status: Home / Self Care | Payer: 59 | Source: Ambulatory Visit | Attending: Cardiology | Admitting: Cardiology

## 2016-07-25 DIAGNOSIS — I5022 Chronic systolic (congestive) heart failure: Secondary | ICD-10-CM | POA: Insufficient documentation

## 2016-07-25 LAB — BASIC METABOLIC PANEL
Anion gap: 5 (ref 5–15)
BUN: 12 mg/dL (ref 6–20)
CO2: 28 mmol/L (ref 22–32)
Calcium: 9.2 mg/dL (ref 8.9–10.3)
Chloride: 106 mmol/L (ref 101–111)
Creatinine, Ser: 1.05 mg/dL (ref 0.61–1.24)
GFR calc Af Amer: >60 mL/min (ref >60)
GFR calc non Af Amer: >60 mL/min (ref >60)
Glucose, Bld: 104 mg/dL — ABNORMAL HIGH (ref 65–99)
Potassium: 4.4 mmol/L (ref 3.5–5.1)
Sodium: 139 mmol/L (ref 135–145)

## 2016-08-27 ENCOUNTER — Ambulatory Visit (HOSPITAL_BASED_OUTPATIENT_CLINIC_OR_DEPARTMENT_OTHER)
Admission: RE | Admit: 2016-08-27 | Discharge: 2016-08-27 | Disposition: A | Discharge status: Home / Self Care | Payer: 59 | Source: Ambulatory Visit | Attending: Cardiology | Admitting: Cardiology

## 2016-08-27 ENCOUNTER — Ambulatory Visit (HOSPITAL_COMMUNITY)
Admission: RE | Admit: 2016-08-27 | Discharge: 2016-08-27 | Disposition: A | Discharge status: Home / Self Care | Payer: 59 | Source: Ambulatory Visit | Attending: Cardiology | Admitting: Cardiology

## 2016-08-27 ENCOUNTER — Encounter (HOSPITAL_COMMUNITY): Payer: Self-pay

## 2016-08-27 VITALS — BP 130/66 | HR 63 | Wt 164.0 lb

## 2016-08-27 DIAGNOSIS — I5022 Chronic systolic (congestive) heart failure: Secondary | ICD-10-CM | POA: Diagnosis not present

## 2016-08-27 DIAGNOSIS — R9431 Abnormal electrocardiogram [ECG] [EKG]: Secondary | ICD-10-CM | POA: Diagnosis not present

## 2016-08-27 DIAGNOSIS — I517 Cardiomegaly: Secondary | ICD-10-CM | POA: Diagnosis not present

## 2016-08-27 DIAGNOSIS — I509 Heart failure, unspecified: Secondary | ICD-10-CM | POA: Diagnosis present

## 2016-08-27 DIAGNOSIS — I4891 Unspecified atrial fibrillation: Secondary | ICD-10-CM | POA: Insufficient documentation

## 2016-08-27 DIAGNOSIS — I4892 Unspecified atrial flutter: Secondary | ICD-10-CM | POA: Insufficient documentation

## 2016-08-27 DIAGNOSIS — Z87891 Personal history of nicotine dependence: Secondary | ICD-10-CM | POA: Diagnosis not present

## 2016-08-27 DIAGNOSIS — R001 Bradycardia, unspecified: Secondary | ICD-10-CM | POA: Diagnosis not present

## 2016-08-27 LAB — COMPREHENSIVE METABOLIC PANEL
ALT: 32 U/L (ref 17–63)
AST: 27 U/L (ref 15–41)
Albumin: 4.2 g/dL (ref 3.5–5.0)
Alkaline Phosphatase: 48 U/L (ref 38–126)
Anion gap: 8 (ref 5–15)
BUN: 17 mg/dL (ref 6–20)
CO2: 27 mmol/L (ref 22–32)
Calcium: 9.9 mg/dL (ref 8.9–10.3)
Chloride: 102 mmol/L (ref 101–111)
Creatinine, Ser: 1.25 mg/dL — ABNORMAL HIGH (ref 0.61–1.24)
GFR calc Af Amer: >60 mL/min (ref >60)
GFR calc non Af Amer: >60 mL/min (ref >60)
Glucose, Bld: 93 mg/dL (ref 65–99)
Potassium: 4.3 mmol/L (ref 3.5–5.1)
Sodium: 137 mmol/L (ref 135–145)
Total Bilirubin: 0.4 mg/dL (ref 0.3–1.2)
Total Protein: 7.4 g/dL (ref 6.5–8.1)

## 2016-08-27 LAB — CBC
HCT: 40.3 % (ref 39.0–52.0)
Hemoglobin: 13.5 g/dL (ref 13.0–17.0)
MCH: 32.7 pg (ref 26.0–34.0)
MCHC: 33.5 g/dL (ref 30.0–36.0)
MCV: 97.6 fL (ref 78.0–100.0)
Platelets: 283 10*3/uL (ref 150–400)
RBC: 4.13 MIL/uL — ABNORMAL LOW (ref 4.22–5.81)
RDW: 13.4 % (ref 11.5–15.5)
WBC: 8.2 10*3/uL (ref 4.0–10.5)

## 2016-08-27 LAB — TSH: TSH: 2.007 u[IU]/mL (ref 0.350–4.500)

## 2016-08-27 MED ORDER — AMIODARONE HCL 200 MG PO TABS: 100.0000 mg | ORAL_TABLET | Freq: Every day | ORAL | 6 refills | Status: DC

## 2016-08-27 NOTE — Patient Instructions (Signed)
Decrease Amiodarone to 100 mg (1/2 tab) daily  Labs today  Please schedule a follow up with your eye MD  We will contact you in 6 months to schedule your next appointment.

## 2016-08-27 NOTE — Progress Notes (Signed)
*  PRELIMINARY RESULTS* Echocardiogram 2D Echocardiogram has been performed.  Caleb Barnes 08/27/2016, 1:36 PM

## 2016-08-29 NOTE — Progress Notes (Signed)
Patient ID: Caleb Barnes, male   DOB: 1956/01/16, 60 y.o.   MRN: [id number]    Advanced Heart Failure Clinic Note    Primary Care: Dr. Silver Huguenin Primary HF: Dr. Shirlee Latch   HPI:  Caleb Barnes is a 60 y.o. male with h/o tobacco abuse and ETOH abuse. Who presented to [facility] ED on 03/31/16 with SOB. BNP and LFTs were elevated. Was found to be in atrial fibrillation with RVR. Echo 03/31/16 with reduced EF to 20-25%, mild MR, mild/mod reduced RV systolic function, Peak PA systolic 43 mm Hg. CTA chest with no PE. He was also placed on CIWA protocol for extensive ETOH use as an outpatient. GI consulted with elevated LFTs at [facility]. Abd [place] with gallbladder thickening but normal-appearing liver. He was started on amiodarone drip for atrial fibrillation. Diuresed with IV lasix 40 mg BID.  He was started on Dobutamine and Dopamine drips. Was transferred to [facility] for further evaluation and treatment with worsening condition. He was weaned off both dopamine and dobutamine in CCU and went back into NSR.  [facility] 04/04/16 showed low CO, mildly elevated filling pressures, and mild nonobstructive CAD. Started on milrinone with low cardiac output. 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 With ETOH history, poor candidate for advanced therapies. He tolerated milrinone wean and was discharged to home without inotropic support and in NSR.   He presents for followup today.  He remains in NSR.  He has done well since discharge.  He has quit drinking completely.  Weight is stable.  No exertional dyspnea, orthopnea/PND, chest pain, or lightheadedness.  No palpitations.  He is active and does his yardwork.  He is back at work at a [employer].   ECG: NSR, LVH   Labs (4/17): HCT 46.1 Labs (5/17): K 4.3, creatinine 0.98, LFTs normal Labs (7/17): K 4.4, creatinine 1.05  PMH 1. Chronic systolic CHF: Nonischemic cardiomyopathy. Possible etiologies include  tachy-mediated CMP with atrial fib/RVR of unknown duration and ETOH CMP. - R/LHC (4/17): Mild nonobstructive CAD; mean RA 7, PA 43/25, mean PCWP 21, CI 1.41 Fick and 1.78 thermodilution.  - Echo (4/17): EF < 20%, moderate LV dilation, mid to moderately decreased RV systolic function.  - Echo (8/17): EF 55-60%, moderate LVH, normal RV size and systolic function.  2. Atrial fibrillation: Paroxysmal. He is on amiodarone.  3. ETOH abuse: Has quit.  4. COPD   Current Outpatient Prescriptions  Medication Sig Dispense Refill  . acetaminophen (TYLENOL) 325 MG tablet Take 650 mg by mouth every 6 (six) hours as needed for mild pain.    Marland Kitchen albuterol (PROVENTIL HFA;VENTOLIN HFA) 108 (90 Base) MCG/ACT inhaler Inhale 1 puff into the lungs every 4 (four) hours as needed for wheezing or shortness of breath. Reported on 07/08/2016    . amiodarone (PACERONE) 200 MG tablet Take 0.5 tablets (100 mg total) by mouth daily. 30 tablet 6  . apixaban (ELIQUIS) 5 MG TABS tablet Take 1 tablet (5 mg total) by mouth 2 (two) times daily. 60 tablet 6  . bisoprolol (ZEBETA) 5 MG tablet Take 0.5 tablets (2.5 mg total) by mouth daily. 15 tablet 3  . digoxin (LANOXIN) 0.125 MG tablet Take 1 tablet (0.125 mg total) by mouth daily. 30 tablet 6  . sacubitril-valsartan (ENTRESTO) 49-51 MG Take 1 tablet by mouth 2 (two) times daily. 60 tablet 6  . spironolactone (ALDACTONE) 25 MG tablet Take 0.5 tablets (12.5 mg total) by mouth  daily. 30 tablet 6  . tiotropium (SPIRIVA) 18 MCG inhalation capsule Place 18 mcg into inhaler and inhale daily.    . furosemide (LASIX) 20 MG tablet Take 1 tablet (20 mg total) by mouth as needed. For swelling or weight gain of 3 lbs overnight or 5 lbs within one week. (Patient not taking: Reported on 07/08/2016) 30 tablet 6   No current facility-administered medications for this encounter.     No Known Allergies    Social History   Social History  . Marital status: Divorced    Spouse name: N/A  .  Number of children: N/A  . Years of education: N/A   Occupational History  . Not on file.   Social History Main Topics  . Smoking status: Former Games developermoker  . Smokeless tobacco: Never Used  . Alcohol use No  . Drug use: No  . Sexual activity: Not on file   Other Topics Concern  . Not on file   Social History Narrative  . No narrative on file      Family History  Problem Relation Age of Onset  . CAD Father     Vitals:   08/27/16 1414  BP: 130/66  Pulse: 63  SpO2: 99%  Weight: 164 lb (74.4 kg)   Wt Readings from Last 3 Encounters:  08/27/16 164 lb (74.4 kg)  07/08/16 163 lb 9.6 oz (74.2 kg)  05/27/16 161 lb 8 oz (73.3 kg)     PHYSICAL EXAM: General:  Well appearing. No respiratory difficulty HEENT: normal Neck: supple. JVD 6-7 cm. Carotids 2+ bilat; no bruits. No thyromegaly or lymphadenopathy noted Cor: PMI nondisplaced. RRR. No M/G/R Lungs: Clear Abdomen: soft, non-tender, non-distended, no HSM. No bruits or masses. +BS  Extremities: no cyanosis, clubbing, rash, edema Neuro: alert & oriented x 3, cranial nerves grossly intact. moves all 4 extremities w/o difficulty. Affect pleasant.  ASSESSMENT & PLAN:  1. Chronic systolic CHF: Nonischemic cardiomyopathy.  I suspect that this was a combination of tachy-mediated CMP + ETOH CMP.  He has quit drinking and he has remained in NSR.  Echo today shows improvement in EF to 55-60%.  Currently NYHA class I-II.  He is not volume overloaded on exam.  He is outside ICD range.  - Continue current bisoprolol, Entresto, and spironolactone.  2. Atrial fibrillation: Paroxysmal.  He is in NSR on amiodarone and apixaban.  - Decrease amiodarone to 100 mg daily.  Check LFTs, TSH today.  He will need regular eye exams while on amiodarone.   3. ETOH abuse: He says that he has quit completely.   Followup in 6 months.   [name] 08/29/2016

## 2016-09-15 ENCOUNTER — Other Ambulatory Visit (HOSPITAL_COMMUNITY): Payer: Self-pay | Admitting: Cardiology

## 2016-10-29 ENCOUNTER — Other Ambulatory Visit (HOSPITAL_COMMUNITY): Payer: Self-pay | Admitting: Student

## 2016-11-02 ENCOUNTER — Other Ambulatory Visit (HOSPITAL_COMMUNITY): Payer: Self-pay | Admitting: Student

## 2016-11-04 ENCOUNTER — Other Ambulatory Visit (HOSPITAL_COMMUNITY): Payer: Self-pay | Admitting: *Deleted

## 2016-11-04 MED ORDER — AMIODARONE HCL 200 MG PO TABS: 100.0000 mg | ORAL_TABLET | Freq: Every day | ORAL | 3 refills | Status: DC

## 2016-11-05 ENCOUNTER — Other Ambulatory Visit (HOSPITAL_COMMUNITY): Payer: Self-pay | Admitting: *Deleted

## 2016-11-10 ENCOUNTER — Other Ambulatory Visit (HOSPITAL_COMMUNITY): Payer: Self-pay | Admitting: Cardiology

## 2016-11-10 MED ORDER — AMIODARONE HCL 200 MG PO TABS: 100.0000 mg | ORAL_TABLET | Freq: Every day | ORAL | 3 refills | Status: DC

## 2016-12-17 ENCOUNTER — Telehealth (HOSPITAL_COMMUNITY): Payer: Self-pay | Admitting: *Deleted

## 2016-12-17 NOTE — Telephone Encounter (Signed)
Medication Samples have been provided to the patient.  Drug name: Delene Loll     Strength: 49 mg/51 mg    Qty: 2-bottles of 14 tablets  LOT: [id number]  Exp.Date: July 2019  Dosing instructions: 1 Tablet by mouth 2 times daily  The patient has been instructed regarding the correct time, dose, and frequency of taking this medication, including desired effects and most common side effects.    *Patient has met the max amount for the year on his $10-Copay card for [facility].  Dashiell Franchino called asking for enough samples to get him through the end of the year.  She will pickup at front desk 12/18/2016.  Kennieth Rad 12:11 PM 12/17/2016

## 2017-01-19 ENCOUNTER — Other Ambulatory Visit (HOSPITAL_COMMUNITY): Payer: Self-pay | Admitting: Cardiology

## 2017-02-24 ENCOUNTER — Telehealth (HOSPITAL_COMMUNITY): Payer: Self-pay | Admitting: *Deleted

## 2017-02-24 NOTE — Telephone Encounter (Signed)
Notes Recorded by Modesta Messing, CMA on 02/24/2017 at 8:15 AM EST Pt aware of lab result and medication change. ------  Notes Recorded by Modesta Messing, CMA on 02/20/2017 at 11:09 AM EST No answer no voicemail will try patient again later ------  Notes Recorded by Laurey Morale, MD on 02/20/2017 at 10:34 AM EST Please call, have him stop digoxin (EF has recovered, digoxin level elevated).

## 2017-02-24 NOTE — Telephone Encounter (Signed)
-----   Message from Laurey Morale, MD sent at 02/20/2017 10:34 AM EST ----- Please call, have him stop digoxin (EF has recovered, digoxin level elevated).

## 2017-03-11 ENCOUNTER — Other Ambulatory Visit (HOSPITAL_COMMUNITY): Payer: Self-pay | Admitting: Student

## 2017-03-13 ENCOUNTER — Other Ambulatory Visit (HOSPITAL_COMMUNITY): Payer: Self-pay | Admitting: Student

## 2017-04-18 IMAGING — CT CT ANGIO CHEST
2 of 6 series · 19 of 46 positions shown · IV contrast (APPLIED)
Comparison: Chest x-ray performed today.

CLINICAL DATA: Difficulty sleeping, shortness of breath since
[REDACTED].

EXAM:
CT ANGIOGRAPHY CHEST WITH CONTRAST
TECHNIQUE: Multidetector CT imaging of the chest was performed using the
standard protocol during bolus administration of intravenous
contrast. Multiplanar CT image reconstructions and MIPs were
obtained to evaluate the vascular anatomy.
CONTRAST:  100 cc Isovue 370 IV

[Series 5: pe thins 1.5 · axial · 0.68mm/px · z∈[-348,-83]mm · 16 of 248 slices shown]
[im 14/248  lung]
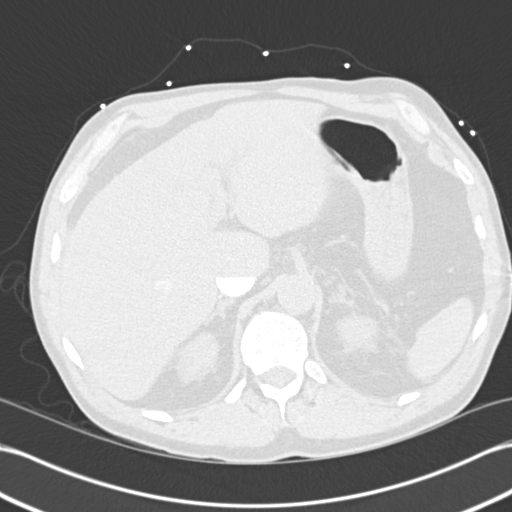
[im 27/248  soft-tissue]
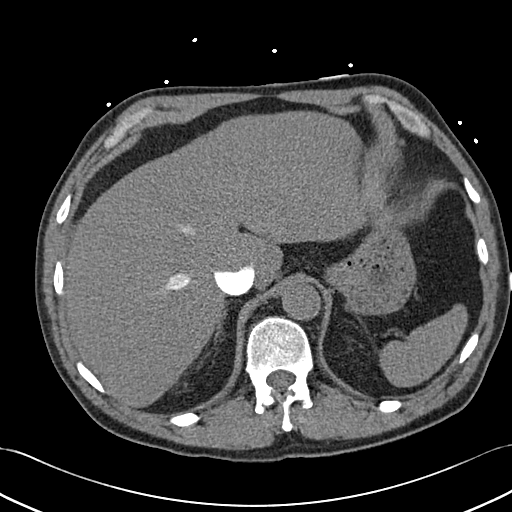
[im 40/248  lung]
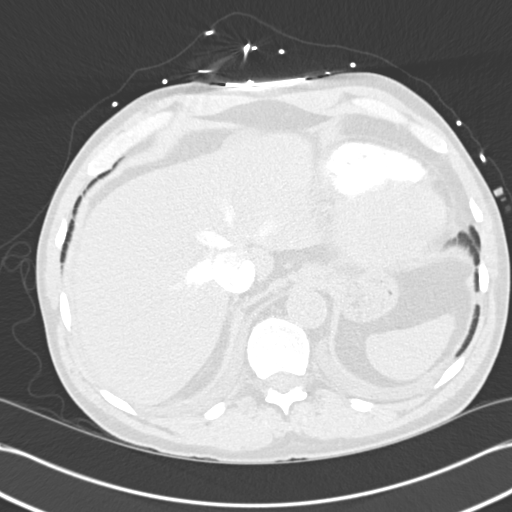
[im 53/248  soft-tissue]
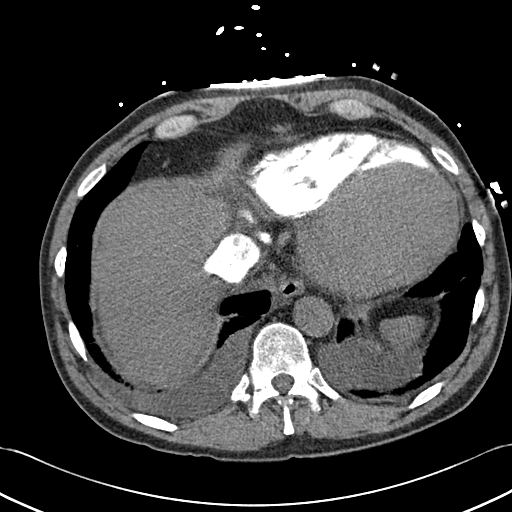
[im 79/248  lung]
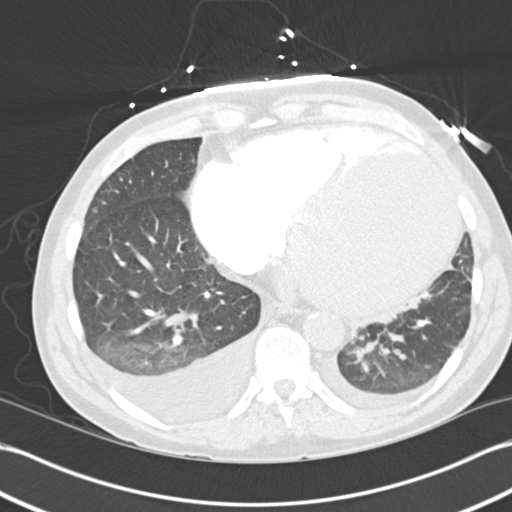
[im 92/248  soft-tissue]
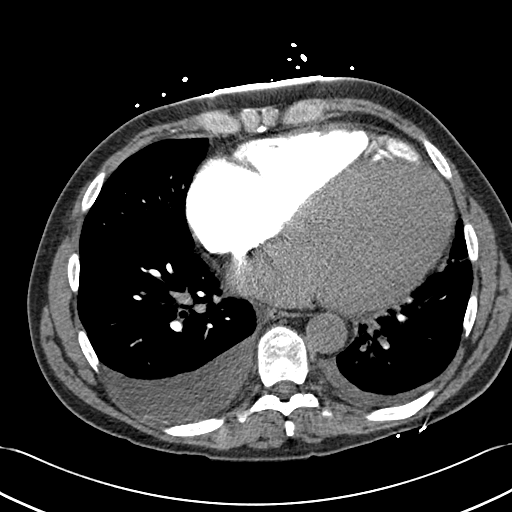
[im 105/248  lung]
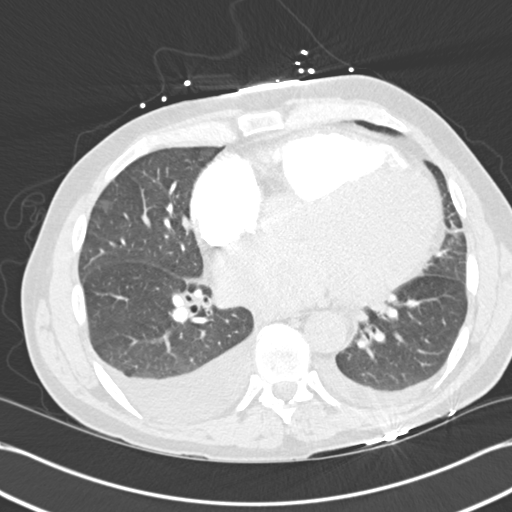
[im 118/248  soft-tissue]
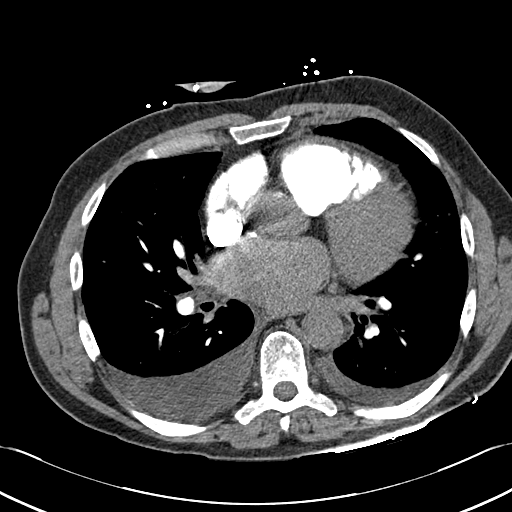
[im 131/248  lung]
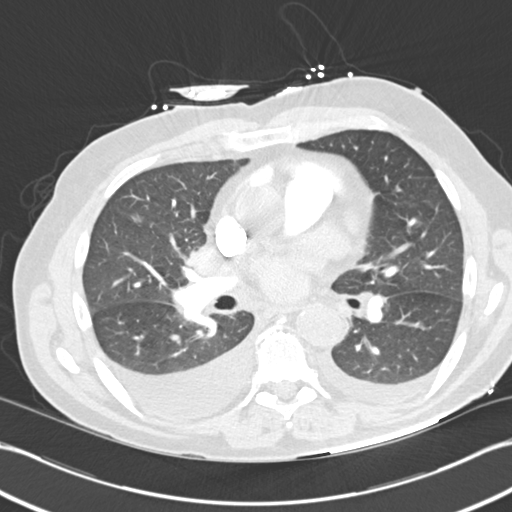
[im 144/248  soft-tissue]
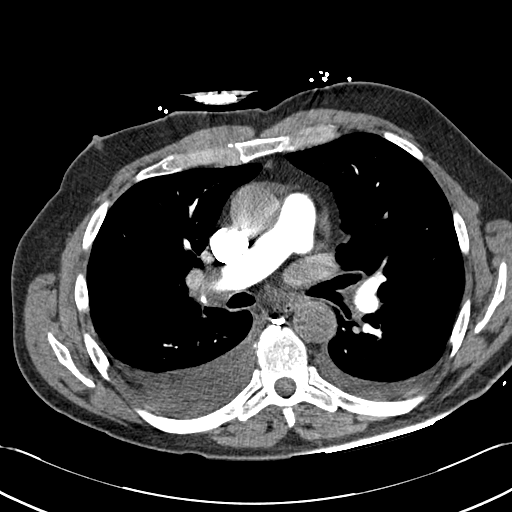
[im 157/248  lung]
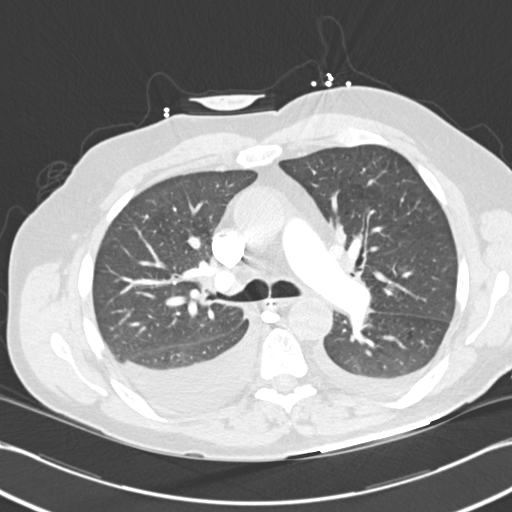
[im 170/248  soft-tissue]
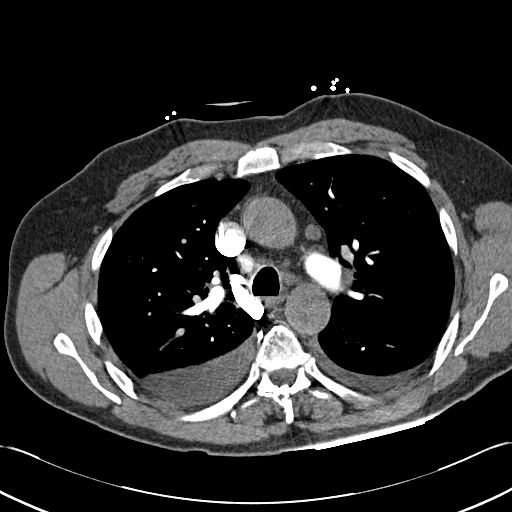
[im 196/248  lung]
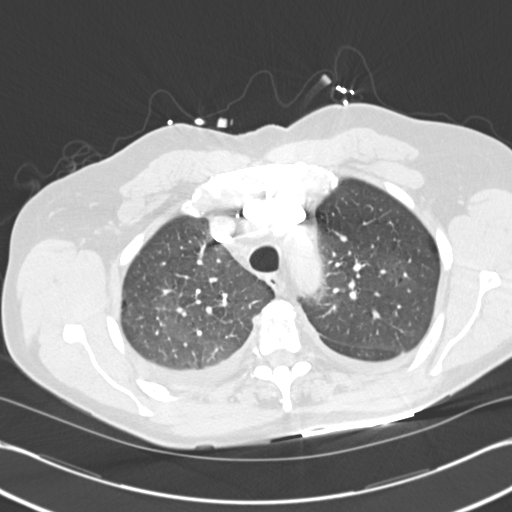
[im 209/248  soft-tissue]
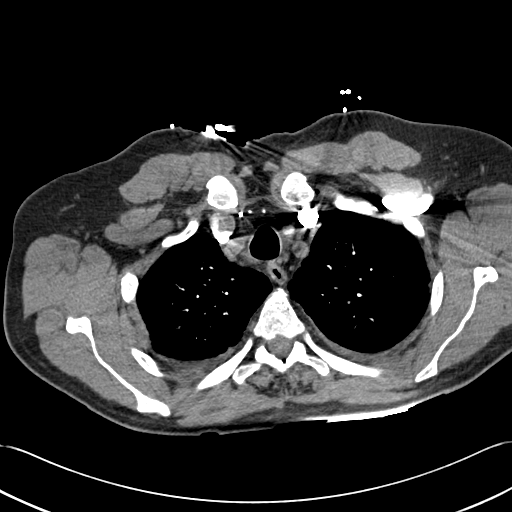
[im 222/248  lung]
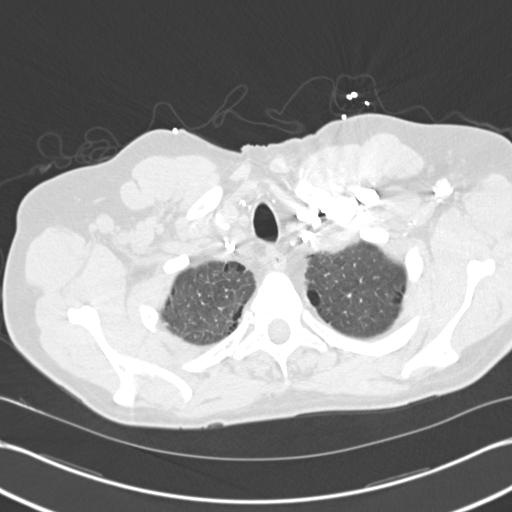
[im 235/248  soft-tissue]
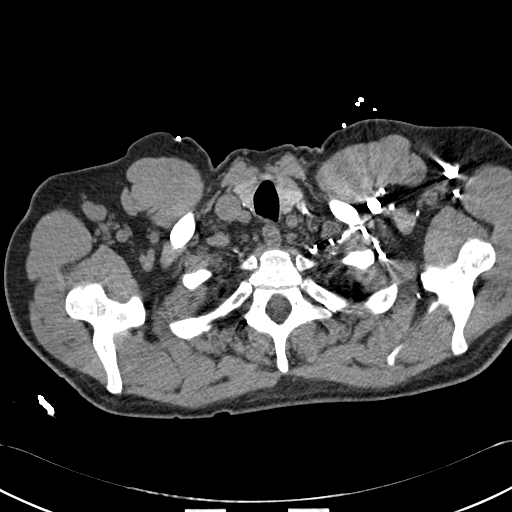

[Series 7: cor mpr 2.0 · coronal · 0.57mm/px · 3 of 131 slices shown]
[im 33/131  soft-tissue]
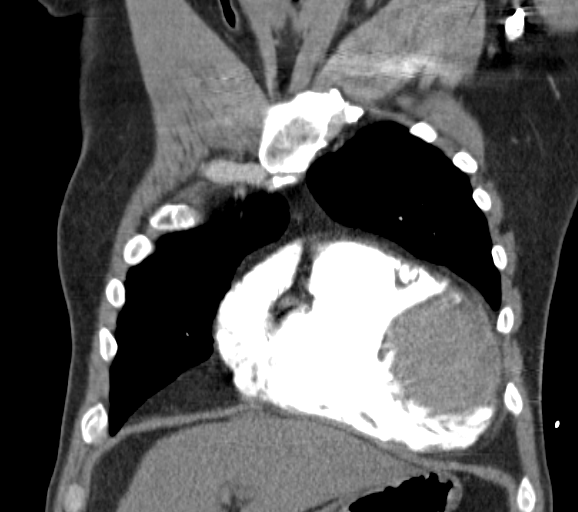
[im 66/131  soft-tissue]
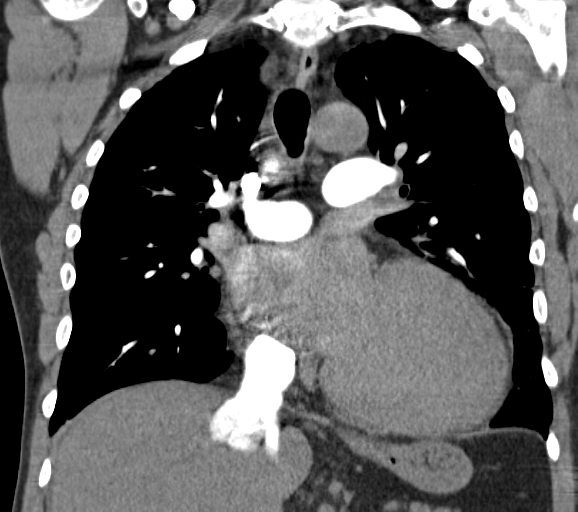
[im 98/131  soft-tissue]
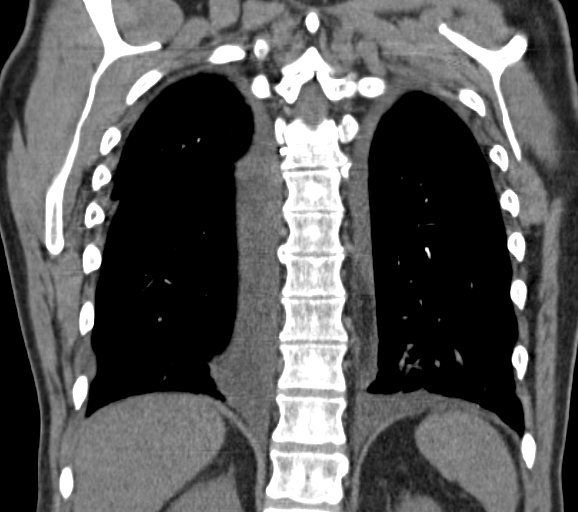

[19 of 46 positions shown; findings below may reference images not displayed]

FINDINGS: No filling defects in the pulmonary arteries to suggest pulmonary
emboli. Small bilateral pleural effusions, right larger than left.
Mild vascular congestion and central interstitial prominence could
reflect early interstitial edema. Ground-glass opacities in the
posterior lower lobes could reflect edema or atelectasis. Marked
cardiomegaly. Aorta is normal caliber. Borderline size mediastinal
and bilateral hilar lymph nodes may be related to congestion. Chest
wall soft tissues are unremarkable. Imaging into the upper abdomen
shows no acute findings.

Review of the MIP images confirms the above findings.
IMPRESSION: Marked cardiomegaly with vascular congestion and probable perihilar
interstitial edema. Small bilateral effusions, right greater than
left.

No evidence of pulmonary embolus.

## 2017-04-19 IMAGING — DX DG CHEST 1V PORT
1 series · 1 of 1 positions shown · non-contrast
Comparison: 03/31/2016

CLINICAL DATA: Evaluate central line placement

EXAM:
PORTABLE CHEST 1 VIEW

[chest ap]
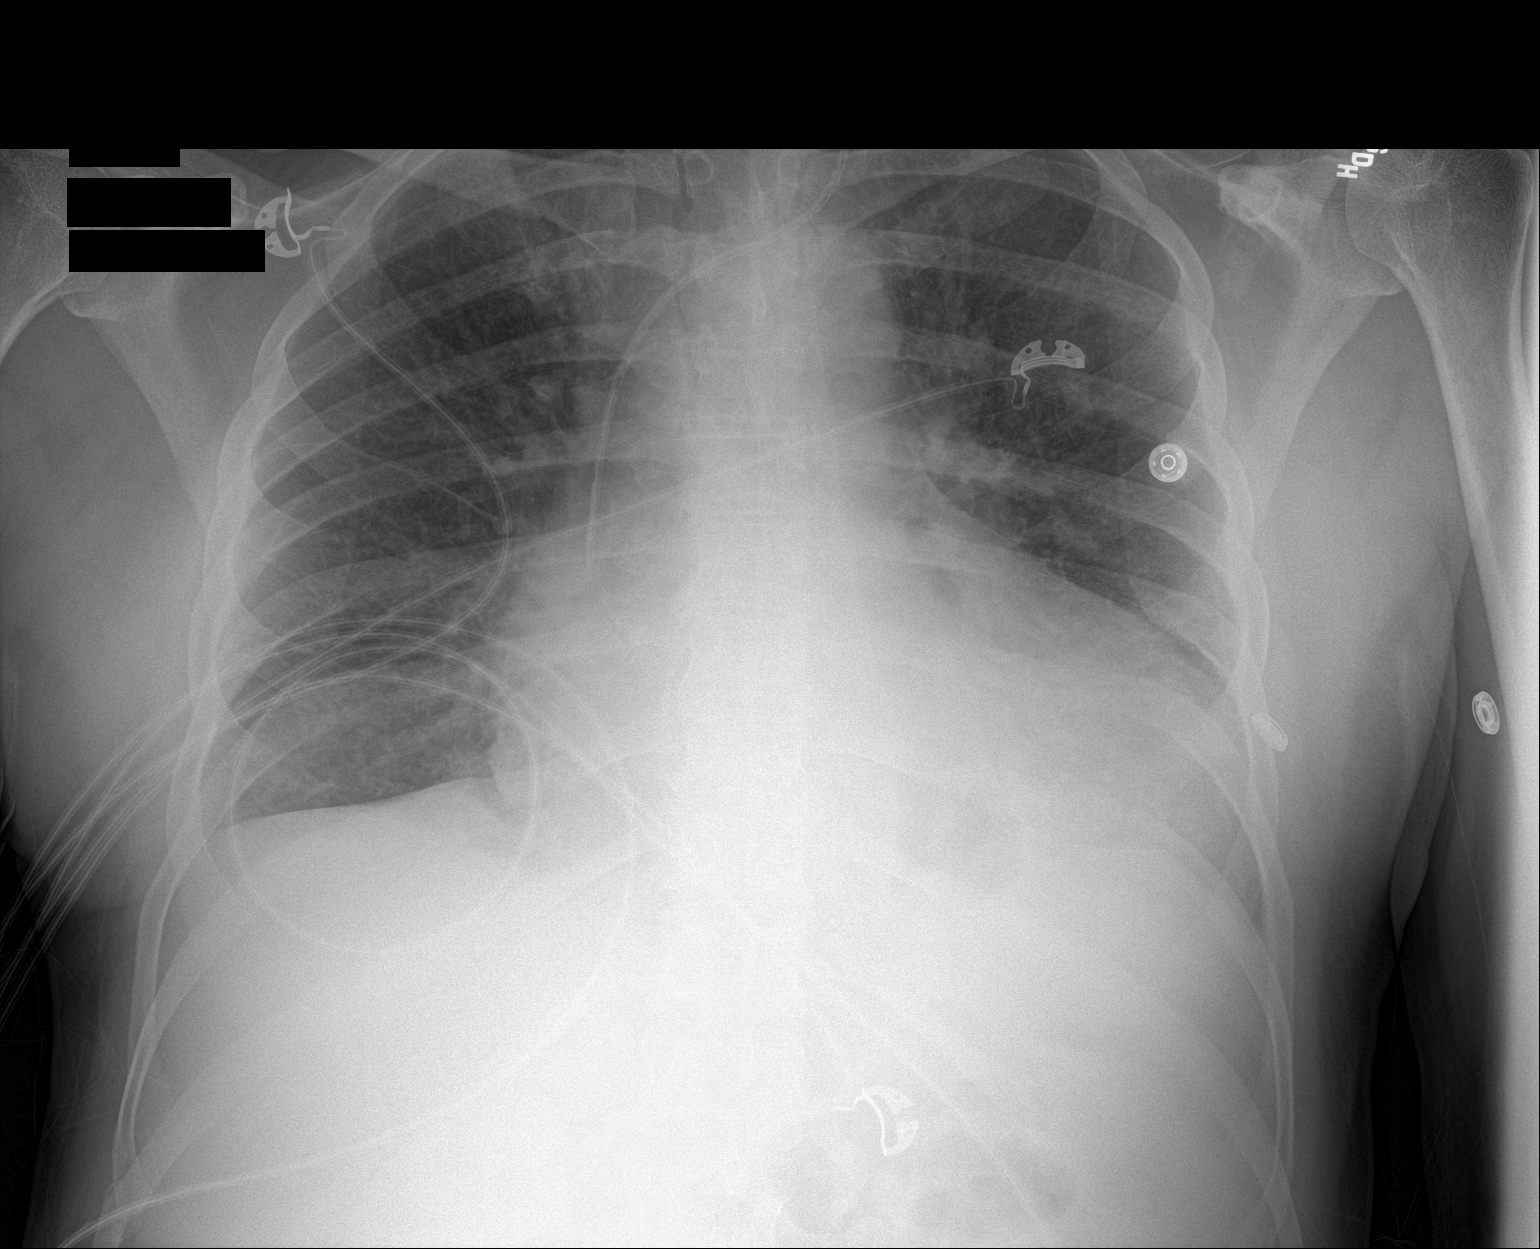

[1 of 1 positions shown; findings below may reference images not displayed]

FINDINGS: Left Central line is in place with the tip at the cavoatrial
junction. No pneumothorax. Cardiomegaly. Mild vascular congestion.
No confluent airspace opacities, effusions or overt edema. No acute
bony abnormality.
IMPRESSION: Cardiomegaly, vascular congestion.

## 2017-04-19 IMAGING — US US ART/VEN ABD/PELV/SCROTUM DOPPLER COMPLETE
2 series · 14 of 25 positions shown · non-contrast
Comparison: Ultrasound and CT from the previous day

CLINICAL DATA: Elevated LFTs.

EXAM:
DUPLEX ULTRASOUND OF LIVER
TECHNIQUE: Color and duplex Doppler ultrasound was performed to evaluate the
hepatic in-flow and out-flow vessels.

[Series 1: us art/ven abd/pelv/scrotum doppler complete · 0.26mm/px · 13 of 46 slices shown (1 of 2)]
[im 1/46]
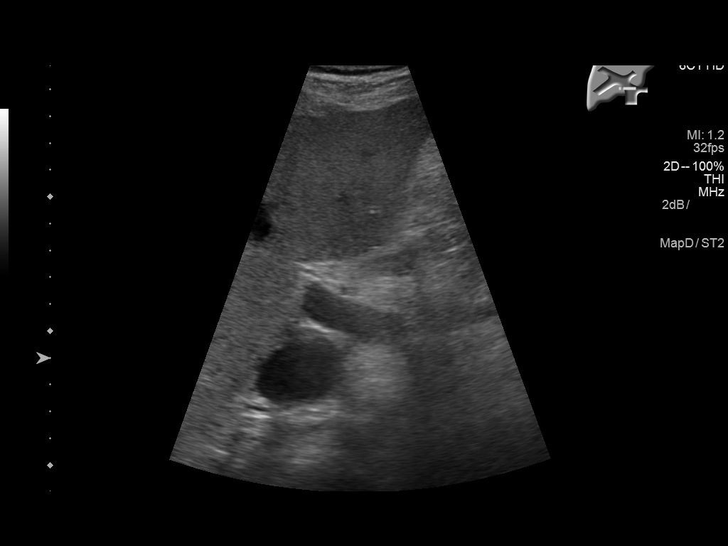
[im 4/46]
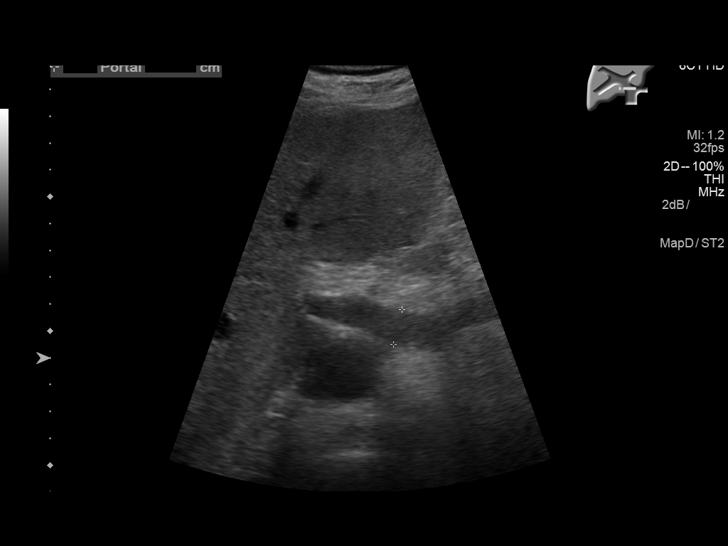
[im 8/46]
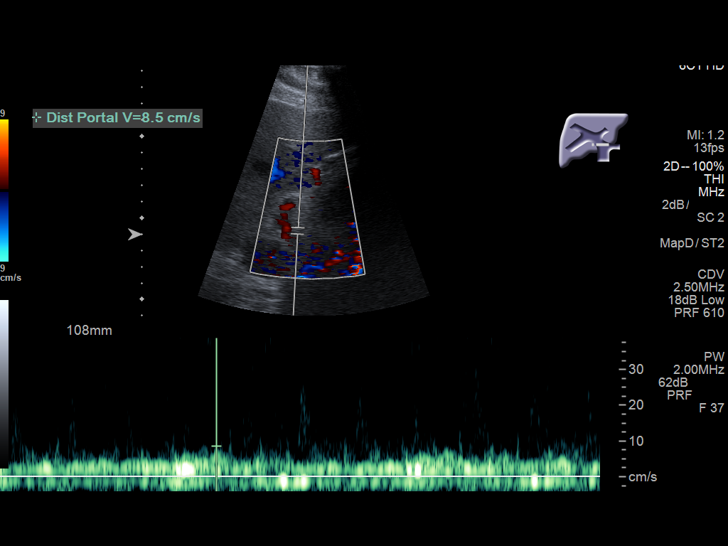
[im 12/46]
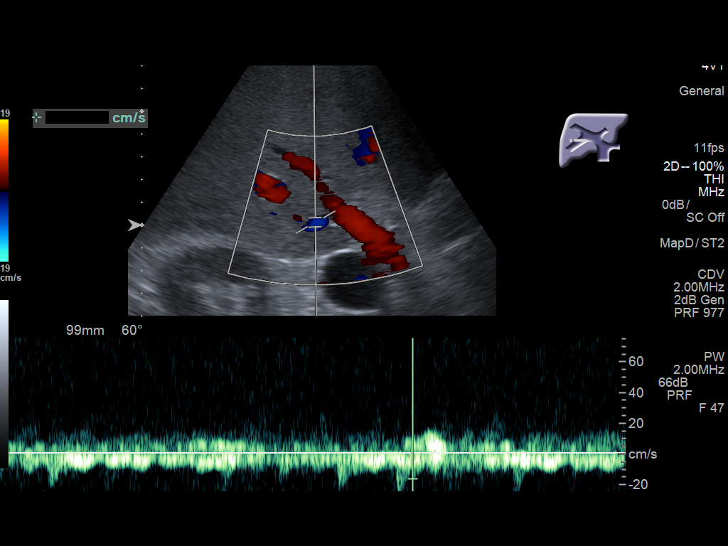
[im 16/46]
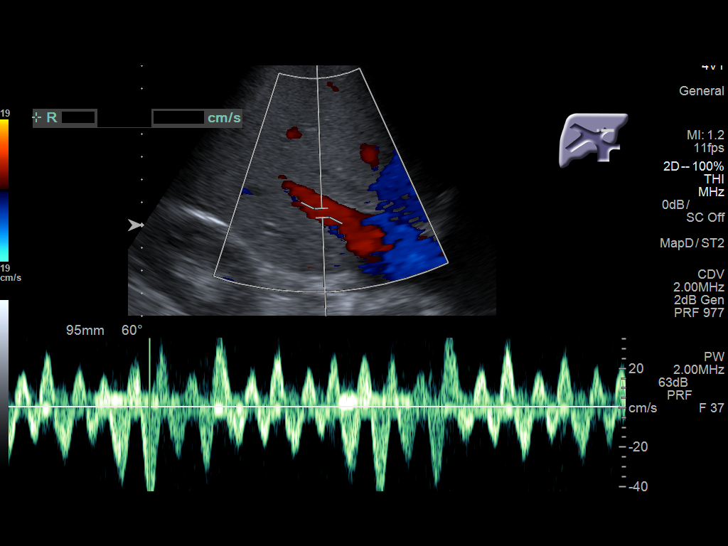
[im 18/46]
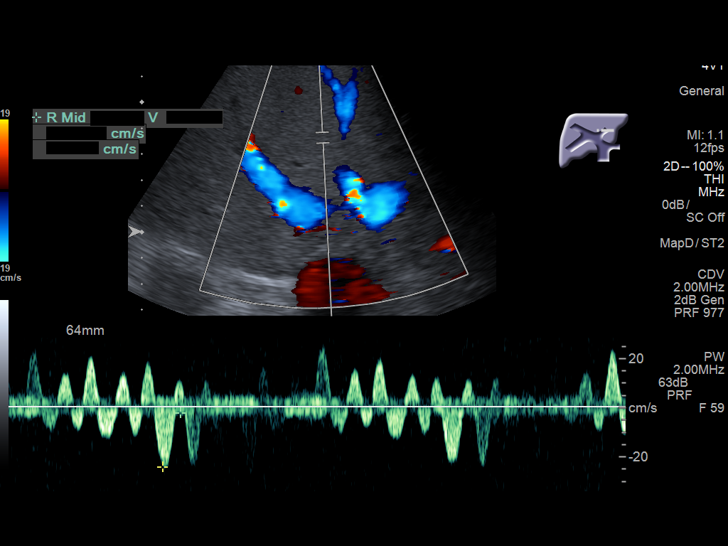
[im 22/46]
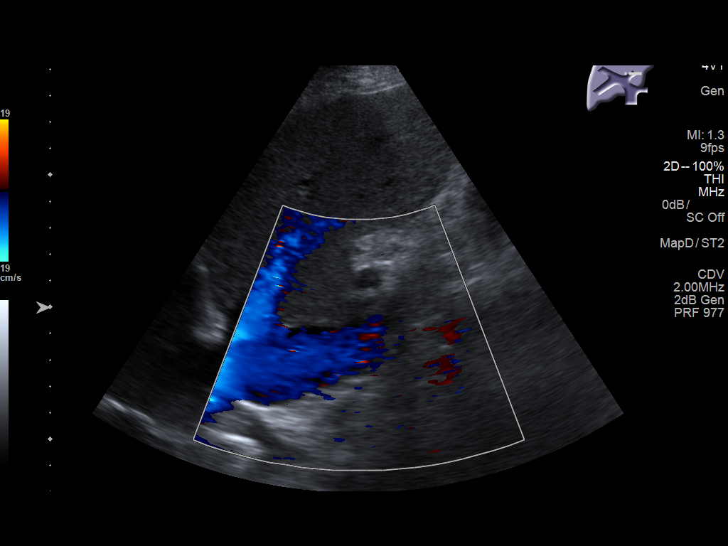
[im 26/46]
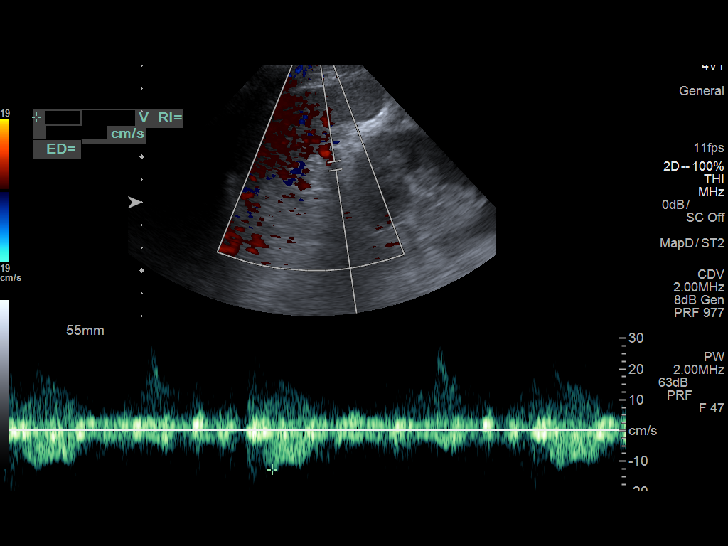
[im 30/46]
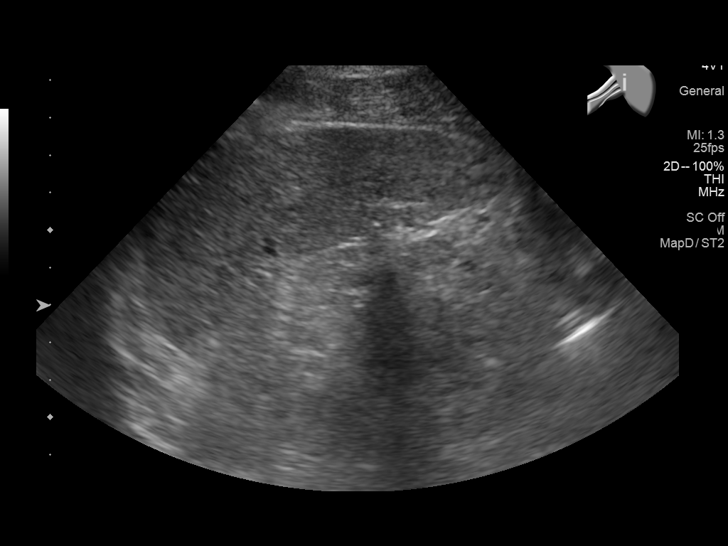
[im 32/46]
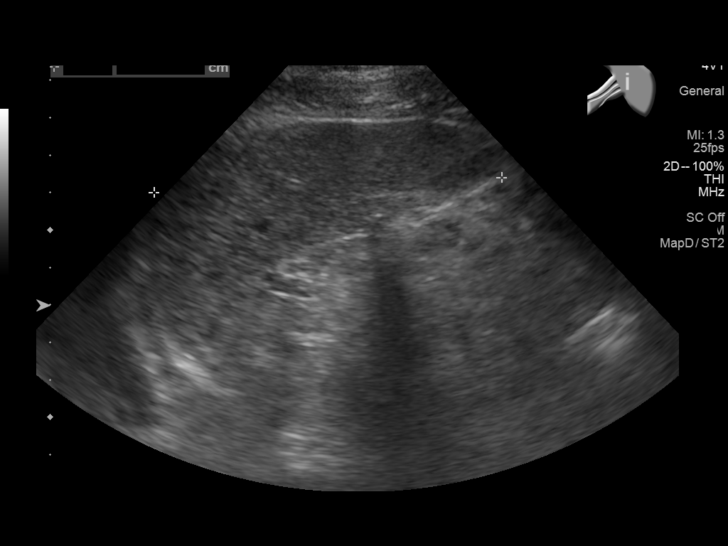
[im 36/46]
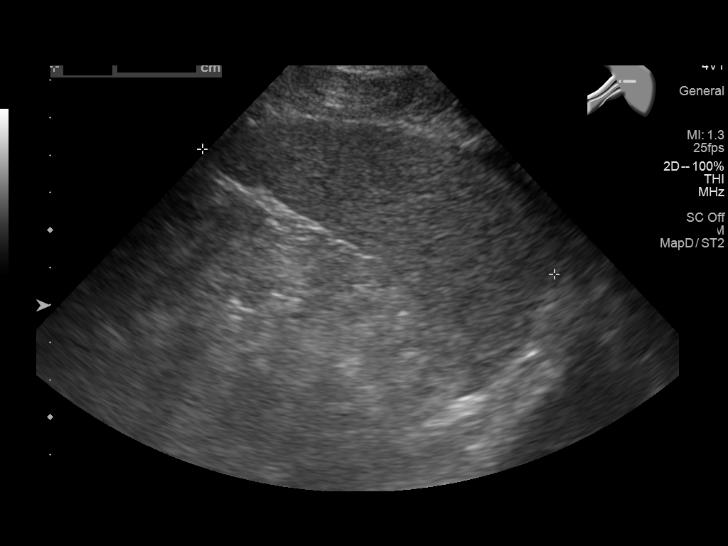
[im 40/46]
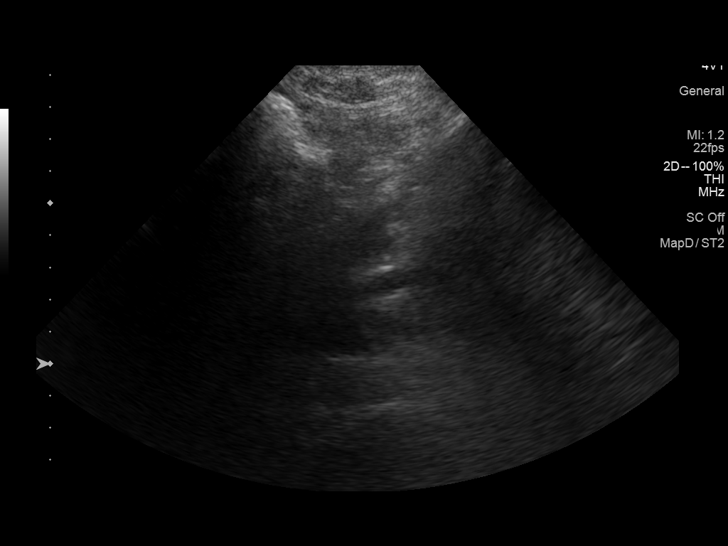
[im 44/46]
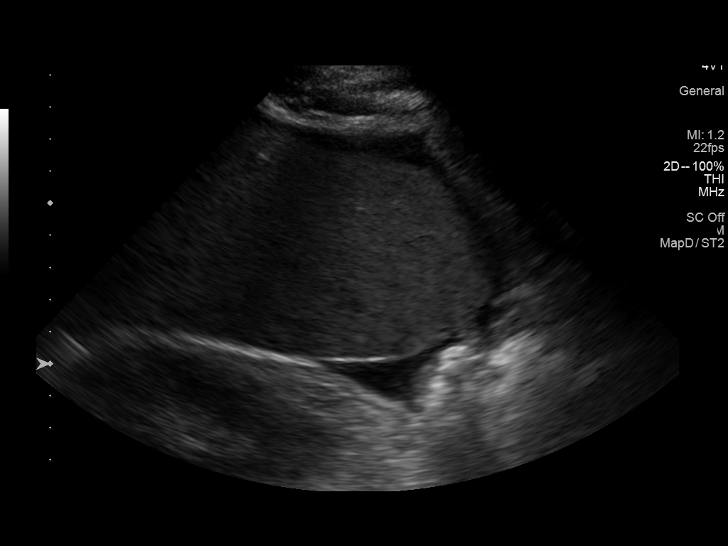

[Series 2001: us art/ven abd/pelv/scrotum doppler complete · 0.19mm/px · 1 of 1 slices shown (2 of 2)]
[im 1/1]
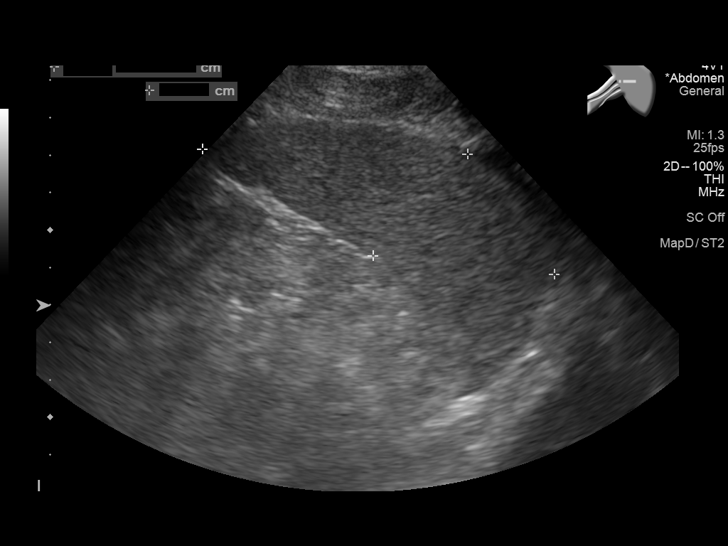

[14 of 25 positions shown; findings below may reference images not displayed]

FINDINGS: Portal Vein 1.3 cm diameter. No evidence of occlusion or thrombus.
Velocities (all hepatopetal):

Main:  8-17 cm/sec

Right:  16 cm/sec

Left:  10 cm/sec

Hepatic Vein Velocities (all hepatofugal):

Right:  53 cm/sec

Middle:  24 cm/sec

Left:  19 cm/sec

Intrahepatic IVC is patent, velocity 33 cm/second.

Hepatic Artery Velocity:  61 cm/sec

Spleen 9.3 x 10 x 3.7 cm. Splenic Vein shows no evidence of
occlusion or thrombus. Velocity: 13 cm/sec

Varices: None seen

Ascites: Trace perisplenic and perihepatic
IMPRESSION: 1. Normal liver vascular assessment.
2. Trace abdominal ascites.

## 2017-04-24 ENCOUNTER — Other Ambulatory Visit (HOSPITAL_COMMUNITY): Payer: Self-pay | Admitting: *Deleted

## 2017-04-24 MED ORDER — AMIODARONE HCL 200 MG PO TABS
100.0000 mg | ORAL_TABLET | Freq: Every day | ORAL | 3 refills | Status: DC
Start: 2017-04-24 — End: 2017-06-01

## 2017-04-24 MED ORDER — AMIODARONE HCL 200 MG PO TABS: 200.0000 mg | ORAL_TABLET | Freq: Every day | ORAL | 3 refills | Status: DC

## 2017-05-20 ENCOUNTER — Other Ambulatory Visit (HOSPITAL_COMMUNITY): Payer: Self-pay | Admitting: Cardiology

## 2017-05-30 ENCOUNTER — Other Ambulatory Visit (HOSPITAL_COMMUNITY): Payer: Self-pay | Admitting: Student

## 2017-06-01 ENCOUNTER — Ambulatory Visit (HOSPITAL_COMMUNITY)
Admission: RE | Admit: 2017-06-01 | Discharge: 2017-06-01 | Disposition: A | Discharge status: Home / Self Care | Payer: 59 | Source: Ambulatory Visit | Attending: Cardiology | Admitting: Cardiology

## 2017-06-01 ENCOUNTER — Encounter (HOSPITAL_COMMUNITY): Payer: Self-pay

## 2017-06-01 VITALS — BP 120/66 | HR 61 | Wt 166.2 lb

## 2017-06-01 DIAGNOSIS — I4892 Unspecified atrial flutter: Secondary | ICD-10-CM | POA: Diagnosis not present

## 2017-06-01 DIAGNOSIS — I429 Cardiomyopathy, unspecified: Secondary | ICD-10-CM | POA: Diagnosis not present

## 2017-06-01 DIAGNOSIS — Z7901 Long term (current) use of anticoagulants: Secondary | ICD-10-CM | POA: Insufficient documentation

## 2017-06-01 DIAGNOSIS — J449 Chronic obstructive pulmonary disease, unspecified: Secondary | ICD-10-CM | POA: Insufficient documentation

## 2017-06-01 DIAGNOSIS — I2510 Atherosclerotic heart disease of native coronary artery without angina pectoris: Secondary | ICD-10-CM | POA: Diagnosis not present

## 2017-06-01 DIAGNOSIS — I5022 Chronic systolic (congestive) heart failure: Secondary | ICD-10-CM | POA: Insufficient documentation

## 2017-06-01 DIAGNOSIS — I480 Paroxysmal atrial fibrillation: Secondary | ICD-10-CM | POA: Diagnosis not present

## 2017-06-01 DIAGNOSIS — Z87891 Personal history of nicotine dependence: Secondary | ICD-10-CM | POA: Insufficient documentation

## 2017-06-01 DIAGNOSIS — Z8249 Family history of ischemic heart disease and other diseases of the circulatory system: Secondary | ICD-10-CM | POA: Insufficient documentation

## 2017-06-01 DIAGNOSIS — F1010 Alcohol abuse, uncomplicated: Secondary | ICD-10-CM | POA: Insufficient documentation

## 2017-06-01 LAB — CBC
HCT: 40 % (ref 39.0–52.0)
Hemoglobin: 13.4 g/dL (ref 13.0–17.0)
MCH: 31.9 pg (ref 26.0–34.0)
MCHC: 33.5 g/dL (ref 30.0–36.0)
MCV: 95.2 fL (ref 78.0–100.0)
Platelets: 266 10*3/uL (ref 150–400)
RBC: 4.2 MIL/uL — ABNORMAL LOW (ref 4.22–5.81)
RDW: 13.3 % (ref 11.5–15.5)
WBC: 6.8 10*3/uL (ref 4.0–10.5)

## 2017-06-01 LAB — COMPREHENSIVE METABOLIC PANEL
ALT: 24 U/L (ref 17–63)
AST: 23 U/L (ref 15–41)
Albumin: 4.1 g/dL (ref 3.5–5.0)
Alkaline Phosphatase: 45 U/L (ref 38–126)
Anion gap: 7 (ref 5–15)
BUN: 14 mg/dL (ref 6–20)
CO2: 28 mmol/L (ref 22–32)
Calcium: 9.3 mg/dL (ref 8.9–10.3)
Chloride: 103 mmol/L (ref 101–111)
Creatinine, Ser: 1.21 mg/dL (ref 0.61–1.24)
GFR calc Af Amer: >60 mL/min (ref >60)
GFR calc non Af Amer: >60 mL/min (ref >60)
Glucose, Bld: 97 mg/dL (ref 65–99)
Potassium: 4.2 mmol/L (ref 3.5–5.1)
Sodium: 138 mmol/L (ref 135–145)
Total Bilirubin: 0.6 mg/dL (ref 0.3–1.2)
Total Protein: 6.9 g/dL (ref 6.5–8.1)

## 2017-06-01 LAB — TSH: TSH: 2.54 u[IU]/mL (ref 0.350–4.500)

## 2017-06-01 NOTE — Patient Instructions (Signed)
Stop Amiodarone   Labs today  Labs in 3 months  Your physician has requested that you have an echocardiogram. Echocardiography is a painless test that uses sound waves to create images of your heart. It provides your doctor with information about the size and shape of your heart and how well your heart's chambers and valves are working. This procedure takes approximately one hour. There are no restrictions for this procedure.  [place]  We will contact you in 6 months to schedule your next appointment.

## 2017-06-01 NOTE — Progress Notes (Signed)
Patient ID: Caleb Barnes, male   DOB: Mar 25, 1956, 61 y.o.   MRN: [id number]    Advanced Heart Failure Clinic Note    Primary Care: Dr. Burnett Sheng HF Cardiology: Dr. Shirlee Latch   HPI:  Caleb Barnes is a 61 y.o. male with h/o tobacco abuse and ETOH abuse who presented to [facility] ED on 03/31/16 with SOB. BNP and LFTs were elevated. Was found to be in atrial fibrillation with RVR. Echo 03/31/16 with reduced EF to 20-25%, mild MR, mild/mod reduced RV systolic function, Peak PA systolic 43 mm Hg. CTA chest with no PE. He was also placed on CIWA protocol for extensive ETOH use as an outpatient. GI consulted with elevated LFTs at [facility]. Abd [place] with gallbladder thickening but normal-appearing liver. He was started on amiodarone drip for atrial fibrillation. Diuresed with IV lasix 40 mg BID.  He was started on Dobutamine and Dopamine drips. Was transferred to [facility] for further evaluation and treatment with worsening condition. He was weaned off both dopamine and dobutamine in CCU and went back into NSR.  [facility] 04/04/16 showed low CO, mildly elevated filling pressures, and mild nonobstructive CAD. Started on milrinone with low cardiac output. 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 He tolerated milrinone wean and was discharged to home without inotropic support and in NSR.  Repeat echo in 8/17 showed EF up to 55-60%.   He presents for followup today.  He remains in NSR.  Weight is stable.  No tachypalpitations.  He works full time and is very active at work. No exertional dyspnea or chest pain.  Under stress as his wife has been ill.  He is not smoking and rarely drinks ETOH now.    ECG: personally reviewed.  Sinus brady at 50 with anteroseptal Qs  Labs (4/17): HCT 46.1 Labs (5/17): K 4.3, creatinine 0.98, LFTs normal Labs (7/17): K 4.4, creatinine 1.05 Labs (2/18): K 4.4, creatinine 1.5, LDL 137, hgb 12.7  PMH 1. Chronic systolic CHF: Nonischemic  cardiomyopathy. Possible etiologies include tachy-mediated CMP with atrial fib/RVR of unknown duration and ETOH CMP. - R/LHC (4/17): Mild nonobstructive CAD; mean RA 7, PA 43/25, mean PCWP 21, CI 1.41 Fick and 1.78 thermodilution.  - Echo (4/17): EF < 20%, moderate LV dilation, mid to moderately decreased RV systolic function.  - Echo (8/17): EF 55-60%, moderate LVH, normal RV size and systolic function.  2. Atrial fibrillation: Paroxysmal. He is on amiodarone.  3. ETOH abuse: Has quit.  4. COPD   Current Outpatient Prescriptions  Medication Sig Dispense Refill  . acetaminophen (TYLENOL) 325 MG tablet Take 650 mg by mouth every 6 (six) hours as needed for mild pain.    Marland Kitchen albuterol (PROVENTIL HFA;VENTOLIN HFA) 108 (90 Base) MCG/ACT inhaler Inhale 1 puff into the lungs every 4 (four) hours as needed for wheezing or shortness of breath. Reported on 07/08/2016    . bisoprolol (ZEBETA) 5 MG tablet TAKE 1/2 OF A TABLET BY MOUTH EVERY DAY 15 tablet 3  . ELIQUIS 5 MG TABS tablet TAKE 1 TABLET (5 MG TOTAL) BY MOUTH 2 (TWO) TIMES DAILY. 60 tablet 6  . ENTRESTO 49-51 MG TAKE 1 TABLET BY MOUTH 2 (TWO) TIMES DAILY. 60 tablet 6  . tiotropium (SPIRIVA) 18 MCG inhalation capsule Place 18 mcg into inhaler and inhale daily.    . furosemide (LASIX) 20 MG tablet TAKE 1 TABLET BY MOUTH AS NEEDED FOR SWELLING OR WEIGHT GAIN OF 3 LBS  OVERNIGHT OR 5 LBS IN 1 WEEK (Patient not taking: Reported on 06/01/2017) 30 tablet 6  . spironolactone (ALDACTONE) 25 MG tablet TAKE 1/2 OF A TABLET BY MOUTH DAILY 45 tablet 3   No current facility-administered medications for this encounter.     No Known Allergies    Social History   Social History  . Marital status: Divorced    Spouse name: N/A  . Number of children: N/A  . Years of education: N/A   Occupational History  . Not on file.   Social History Main Topics  . Smoking status: Former Games developer  . Smokeless tobacco: Never Used  . Alcohol use No  . Drug use: No  .  Sexual activity: Not on file   Other Topics Concern  . Not on file   Social History Narrative  . No narrative on file      Family History  Problem Relation Age of Onset  . CAD Father    ROS: All systems reviewed and negative except as per HPI.   Vitals:   06/01/17 0906  BP: 120/66  Pulse: 61  SpO2: 100%  Weight: 166 lb 4 oz (75.4 kg)   Wt Readings from Last 3 Encounters:  06/01/17 166 lb 4 oz (75.4 kg)  08/27/16 164 lb (74.4 kg)  07/08/16 163 lb 9.6 oz (74.2 kg)     PHYSICAL EXAM: General:  NAD HEENT: normal Neck: supple. JVP not elevated. Carotids 2+ bilat; no bruits. No thyromegaly or lymphadenopathy noted Cor: PMI nondisplaced. RRR. No M/G/R Lungs: Clear Abdomen: soft, non-tender, non-distended, no HSM. No bruits or masses. +BS  Extremities: no cyanosis, clubbing, rash, edema Neuro: alert & oriented x 3, cranial nerves grossly intact. moves all 4 extremities w/o difficulty. Affect pleasant.  ASSESSMENT & PLAN:  1. Chronic systolic CHF: Nonischemic cardiomyopathy.  I suspect that this was a combination of tachy-mediated CMP + ETOH CMP.  He has quit drinking and he has remained in NSR.  Echo in 8/17 showed improvement in EF to 55-60%.  Currently NYHA class I-II.  He is not volume overloaded on exam.  - Continue current bisoprolol, Entresto, and spironolactone.  - BMET today and again in 3 months.  - I will get an echo in 8/18 to make sure that EF remains normal.  2. Atrial fibrillation: Paroxysmal.  He is in NSR on amiodarone and apixaban.  - Given amiodarone use, check LFTs and TSH today.  I am going to have him stop amiodarone given no recent atrial fibrillation and improvement in EF.   3. ETOH abuse: Very rarely drinks now.  Encouraged him to remain abstinent.    Followup in 6 months.   Caleb Barnes 06/01/2017

## 2017-06-11 ENCOUNTER — Other Ambulatory Visit (HOSPITAL_COMMUNITY): Payer: Self-pay | Admitting: Student

## 2017-06-18 ENCOUNTER — Other Ambulatory Visit (HOSPITAL_COMMUNITY): Payer: Self-pay | Admitting: Cardiology

## 2017-06-18 MED ORDER — SACUBITRIL-VALSARTAN 49-51 MG PO TABS: 1.0000 | ORAL_TABLET | Freq: Two times a day (BID) | ORAL | 6 refills | Status: DC

## 2017-06-23 ENCOUNTER — Telehealth (HOSPITAL_COMMUNITY): Payer: Self-pay | Admitting: Pharmacist

## 2017-06-23 MED ORDER — SACUBITRIL-VALSARTAN 49-51 MG PO TABS: 1.0000 | ORAL_TABLET | Freq: Two times a day (BID) | ORAL | 6 refills | Status: DC

## 2017-06-23 NOTE — Addendum Note (Signed)
Addended by: Devota Pace E on: 06/23/2017 04:55 PM   Modules accepted: Orders

## 2017-06-23 NOTE — Telephone Encounter (Signed)
Called [employer] as [place] required further information. Per [employer], patient's plan requires patient to pay 100% of brand name medications. Attempted to activate new copay card however patient was ineligible for another card.   Called patient to explain above findings. Patient states he is out of medication. Will provide with samples (left at front desk for patient to pick up 06/24/17) and submit patient assistance application to [employer].   Medication Samples have been provided to the patient.  Drug name: Sherryll Burger       Strength: 46-51 mg        Qty: 28  LOT: [id number]  Exp.Date: 09/2018  Dosing instructions: Take 1 tablet by mouth twice daily  The patient has been instructed regarding the correct time, dose, and frequency of taking this medication, including desired effects and most common side effects.   Allena Katz, Pharm.D. PGY1 Pharmacy Resident 6/26/20184:36 PM Pager [phone]

## 2017-07-31 ENCOUNTER — Telehealth (HOSPITAL_COMMUNITY): Payer: Self-pay | Admitting: Pharmacist

## 2017-07-31 NOTE — Telephone Encounter (Signed)
[employer] patient assistance approved for Entresto 49-51 mg BID through 12/28/17.   Tyler Deis. Bonnye Fava, PharmD, BCPS, CPP Clinical Pharmacist Pager: [phone] Phone: [phone] 07/31/2017 10:00 AM

## 2017-08-03 ENCOUNTER — Ambulatory Visit (HOSPITAL_COMMUNITY): Payer: 59 | Attending: Internal Medicine

## 2017-08-03 ENCOUNTER — Other Ambulatory Visit: Payer: Self-pay

## 2017-08-03 DIAGNOSIS — I5022 Chronic systolic (congestive) heart failure: Secondary | ICD-10-CM | POA: Diagnosis not present

## 2017-08-03 DIAGNOSIS — I081 Rheumatic disorders of both mitral and tricuspid valves: Secondary | ICD-10-CM | POA: Diagnosis not present

## 2017-09-03 ENCOUNTER — Ambulatory Visit (HOSPITAL_COMMUNITY)
Admission: RE | Admit: 2017-09-03 | Discharge: 2017-09-03 | Disposition: A | Discharge status: Home / Self Care | Payer: 59 | Source: Ambulatory Visit | Attending: Internal Medicine | Admitting: Internal Medicine

## 2017-09-03 DIAGNOSIS — I5022 Chronic systolic (congestive) heart failure: Secondary | ICD-10-CM | POA: Diagnosis not present

## 2017-09-03 LAB — BASIC METABOLIC PANEL
Anion gap: 9 (ref 5–15)
BUN: 12 mg/dL (ref 6–20)
CO2: 24 mmol/L (ref 22–32)
Calcium: 9.3 mg/dL (ref 8.9–10.3)
Chloride: 107 mmol/L (ref 101–111)
Creatinine, Ser: 1.09 mg/dL (ref 0.61–1.24)
GFR calc Af Amer: >60 mL/min (ref >60)
GFR calc non Af Amer: >60 mL/min (ref >60)
Glucose, Bld: 119 mg/dL — ABNORMAL HIGH (ref 65–99)
Potassium: 4.1 mmol/L (ref 3.5–5.1)
Sodium: 140 mmol/L (ref 135–145)

## 2017-09-22 ENCOUNTER — Other Ambulatory Visit (HOSPITAL_COMMUNITY): Payer: Self-pay | Admitting: Cardiology

## 2017-10-26 ENCOUNTER — Telehealth (HOSPITAL_COMMUNITY): Payer: Self-pay | Admitting: *Deleted

## 2017-10-26 NOTE — Telephone Encounter (Signed)
Pt called stating his Eliquis cost went from $10/month to $400/month and he can not afford it.  He states he has [place] through is job and that has not changed.  He states he did not have a copay card and he is unsure why cost went up but he is currently out of med and unable to get it.  Advised will contact pharmacy to see what is happening and will leave samples at the front desk for him to p/u.  He is thankful for the assitance.  Will f/u w/pharmacy and let him know what we find out.  Medication Samples have been provided to the patient.  Drug name: Eliquis       Strength: 5mg         Qty: 3 LOT: [id number]  Exp.Date: 11/20  Dosing instructions: 1 tab Twice daily   The patient has been instructed regarding the correct time, dose, and frequency of taking this medication, including desired effects and most common side effects.   Juhi Lagrange 4:45 PM 10/26/2017

## 2017-10-27 NOTE — Telephone Encounter (Signed)
Spoke with agent at [employer] ([employer]) who stated that he is responsible for 100% of the cost of all of his medications and he likely reached the max amount that would be covered with the Eliquis copay card. When he comes in for samples of his Eliquis today, I will have him fill out the [employer] patient assistance application.   Caleb Barnes. Bonnye Fava, PharmD, BCPS, CPP Clinical Pharmacist Pager: [phone] Phone: [phone] 10/27/2017 11:06 AM

## 2017-10-28 ENCOUNTER — Other Ambulatory Visit (HOSPITAL_COMMUNITY): Payer: Self-pay | Admitting: Pharmacist

## 2017-10-28 MED ORDER — APIXABAN 5 MG PO TABS: 5.0000 mg | ORAL_TABLET | Freq: Two times a day (BID) | ORAL | 3 refills | Status: DC

## 2017-11-06 ENCOUNTER — Encounter (HOSPITAL_COMMUNITY): Payer: Self-pay | Admitting: Pharmacist

## 2017-11-10 ENCOUNTER — Telehealth (HOSPITAL_COMMUNITY): Payer: Self-pay | Admitting: Pharmacist

## 2017-11-10 NOTE — Telephone Encounter (Addendum)
[employer] patient assistance denied for Eliquis since "patient has insurance coverage". I have called and spoken to [employer] rep and explained that Mr. Loboda is responsible for 100% of the cost of any brand name medications through his insurance company. Sent a letter to [employer] with this information as well and we are awaiting a response. In the meantime, we will provide him with samples if needed.   Tyler Deis. Bonnye Fava, PharmD, BCPS, CPP Clinical Pharmacist Pager: [phone] Phone: [phone] 11/10/2017 1:28 PM

## 2017-11-22 ENCOUNTER — Other Ambulatory Visit (HOSPITAL_COMMUNITY): Payer: Self-pay | Admitting: Internal Medicine

## 2017-12-02 ENCOUNTER — Telehealth (HOSPITAL_COMMUNITY): Payer: Self-pay | Admitting: Pharmacist

## 2017-12-02 MED ORDER — METOPROLOL SUCCINATE ER 50 MG PO TB24: 50.0000 mg | ORAL_TABLET | Freq: Every day | ORAL | 5 refills | Status: DC

## 2017-12-02 NOTE — Telephone Encounter (Signed)
Received a message from [facility] pharmacy that bisoprolol is on indefinite backorder. Per discussion with Dr. Shirlee Latch, will switch to metoprolol succinate 50 mg daily.   Tyler Deis. Bonnye Fava, PharmD, BCPS, CPP Clinical Pharmacist Pager: [phone] Phone: [phone] 12/02/2017 3:40 PM

## 2017-12-03 ENCOUNTER — Telehealth (HOSPITAL_COMMUNITY): Payer: Self-pay | Admitting: Pharmacist

## 2017-12-03 NOTE — Telephone Encounter (Signed)
[employer] patient assistance approved for Eliquis 5 mg BID through 12/02/18.   Tyler Deis. Bonnye Fava, PharmD, BCPS, CPP Clinical Pharmacist Pager: [phone] Phone: [phone] 12/03/2017 11:36 AM

## 2017-12-17 ENCOUNTER — Other Ambulatory Visit (HOSPITAL_COMMUNITY): Payer: Self-pay | Admitting: *Deleted

## 2017-12-17 MED ORDER — METOPROLOL SUCCINATE ER 50 MG PO TB24: 50.0000 mg | ORAL_TABLET | Freq: Every day | ORAL | 5 refills | Status: DC

## 2018-01-11 ENCOUNTER — Encounter (HOSPITAL_COMMUNITY): Payer: Self-pay | Admitting: Cardiology

## 2018-01-11 ENCOUNTER — Ambulatory Visit (HOSPITAL_COMMUNITY)
Admission: RE | Admit: 2018-01-11 | Discharge: 2018-01-11 | Disposition: A | Discharge status: Home / Self Care | Payer: 59 | Source: Ambulatory Visit | Attending: Cardiology | Admitting: Cardiology

## 2018-01-11 ENCOUNTER — Encounter (HOSPITAL_COMMUNITY): Payer: Self-pay

## 2018-01-11 VITALS — BP 132/70 | HR 74 | Wt 171.1 lb

## 2018-01-11 DIAGNOSIS — Z7902 Long term (current) use of antithrombotics/antiplatelets: Secondary | ICD-10-CM | POA: Diagnosis not present

## 2018-01-11 DIAGNOSIS — F1011 Alcohol abuse, in remission: Secondary | ICD-10-CM | POA: Insufficient documentation

## 2018-01-11 DIAGNOSIS — Z79899 Other long term (current) drug therapy: Secondary | ICD-10-CM | POA: Insufficient documentation

## 2018-01-11 DIAGNOSIS — I2510 Atherosclerotic heart disease of native coronary artery without angina pectoris: Secondary | ICD-10-CM | POA: Insufficient documentation

## 2018-01-11 DIAGNOSIS — I480 Paroxysmal atrial fibrillation: Secondary | ICD-10-CM

## 2018-01-11 DIAGNOSIS — Z9889 Other specified postprocedural states: Secondary | ICD-10-CM | POA: Insufficient documentation

## 2018-01-11 DIAGNOSIS — Z8249 Family history of ischemic heart disease and other diseases of the circulatory system: Secondary | ICD-10-CM | POA: Insufficient documentation

## 2018-01-11 DIAGNOSIS — J449 Chronic obstructive pulmonary disease, unspecified: Secondary | ICD-10-CM | POA: Diagnosis not present

## 2018-01-11 DIAGNOSIS — I5022 Chronic systolic (congestive) heart failure: Secondary | ICD-10-CM | POA: Diagnosis not present

## 2018-01-11 DIAGNOSIS — Z87891 Personal history of nicotine dependence: Secondary | ICD-10-CM | POA: Diagnosis not present

## 2018-01-11 DIAGNOSIS — I429 Cardiomyopathy, unspecified: Secondary | ICD-10-CM | POA: Diagnosis not present

## 2018-01-11 LAB — BASIC METABOLIC PANEL
Anion gap: 9 (ref 5–15)
BUN: 14 mg/dL (ref 6–20)
CO2: 25 mmol/L (ref 22–32)
Calcium: 9.1 mg/dL (ref 8.9–10.3)
Chloride: 105 mmol/L (ref 101–111)
Creatinine, Ser: 1.05 mg/dL (ref 0.61–1.24)
GFR calc Af Amer: >60 mL/min (ref >60)
GFR calc non Af Amer: >60 mL/min (ref >60)
Glucose, Bld: 114 mg/dL — ABNORMAL HIGH (ref 65–99)
Potassium: 3.6 mmol/L (ref 3.5–5.1)
Sodium: 139 mmol/L (ref 135–145)

## 2018-01-11 LAB — CBC
HCT: 38.6 % — ABNORMAL LOW (ref 39.0–52.0)
Hemoglobin: 13.5 g/dL (ref 13.0–17.0)
MCH: 32.5 pg (ref 26.0–34.0)
MCHC: 35 g/dL (ref 30.0–36.0)
MCV: 93 fL (ref 78.0–100.0)
Platelets: 315 10*3/uL (ref 150–400)
RBC: 4.15 MIL/uL — ABNORMAL LOW (ref 4.22–5.81)
RDW: 13.6 % (ref 11.5–15.5)
WBC: 5.7 10*3/uL (ref 4.0–10.5)

## 2018-01-11 LAB — HEPATIC FUNCTION PANEL
ALT: 27 U/L (ref 17–63)
AST: 30 U/L (ref 15–41)
Albumin: 4.1 g/dL (ref 3.5–5.0)
Alkaline Phosphatase: 47 U/L (ref 38–126)
Bilirubin, Direct: <0.1 mg/dL — ABNORMAL LOW (ref 0.1–0.5)
Total Bilirubin: 0.5 mg/dL (ref 0.3–1.2)
Total Protein: 6.8 g/dL (ref 6.5–8.1)

## 2018-01-11 NOTE — Progress Notes (Signed)
Patient ID: Caleb Barnes, male   DOB: 06/26/1956, 62 y.o.   MRN: [id number]    Advanced Heart Failure Clinic Note    Primary Care: Dr. Burnett Sheng HF Cardiology: Dr. Shirlee Latch   HPI:  Caleb Barnes is a 62 y.o. male with h/o tobacco abuse and ETOH abuse who presented to [facility] ED on 03/31/16 with SOB. BNP and LFTs were elevated. Was found to be in atrial fibrillation with RVR. Echo 03/31/16 with reduced EF to 20-25%, mild MR, mild/mod reduced RV systolic function, Peak PA systolic 43 mm Hg. CTA chest with no PE. He was also placed on CIWA protocol for extensive ETOH use as an outpatient. GI consulted with elevated LFTs at [facility]. Abd [place] with gallbladder thickening but normal-appearing liver. He was started on amiodarone drip for atrial fibrillation. Diuresed with IV lasix 40 mg BID.  He was started on Dobutamine and Dopamine drips. Was transferred to [facility] for further evaluation and treatment with worsening condition. He was weaned off both dopamine and dobutamine in CCU and went back into NSR.  [facility] 04/04/16 showed low CO, mildly elevated filling pressures, and mild nonobstructive CAD. Started on milrinone with low cardiac output. 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 He tolerated milrinone wean and was discharged to home without inotropic support and in NSR.  Repeat echo in 8/17 showed EF up to 55-60%. Echo 8/18 showed EF 50-55%.    He presents for followup of CHF.  He remains in NSR.  He continues to work full time at a [employer]. He is very active at work with no exertional dyspnea.  No chest pain.  No lightheadedness.  No smoking or ETOH.    ECG: personally reviewed. NSR, poor RWP  Labs (4/17): HCT 46.1 Labs (5/17): K 4.3, creatinine 0.98, LFTs normal Labs (7/17): K 4.4, creatinine 1.05 Labs (2/18): K 4.4, creatinine 1.5, LDL 137, hgb 12.7 Labs (6/18): TSH normal Labs (9/18): K 4.1, creatinine 1.09  PMH 1. Chronic  systolic CHF: Nonischemic cardiomyopathy. Possible etiologies include tachy-mediated CMP with atrial fib/RVR of unknown duration and ETOH CMP. - R/LHC (4/17): Mild nonobstructive CAD; mean RA 7, PA 43/25, mean PCWP 21, CI 1.41 Fick and 1.78 thermodilution.  - Echo (4/17): EF < 20%, moderate LV dilation, mid to moderately decreased RV systolic function.  - Echo (8/17): EF 55-60%, moderate LVH, normal RV size and systolic function.  - Echo (8/18): EF 50-55%, PASP 28 mmHg 2. Atrial fibrillation: Paroxysmal. He is on amiodarone.  3. ETOH abuse: Has quit.  4. COPD  Current Outpatient Medications  Medication Sig Dispense Refill  . acetaminophen (TYLENOL) 325 MG tablet Take 650 mg by mouth every 6 (six) hours as needed for mild pain.    Marland Kitchen albuterol (PROVENTIL HFA;VENTOLIN HFA) 108 (90 Base) MCG/ACT inhaler Inhale 1 puff into the lungs every 4 (four) hours as needed for wheezing or shortness of breath. Reported on 07/08/2016    . apixaban (ELIQUIS) 5 MG TABS tablet Take 1 tablet (5 mg total) by mouth 2 (two) times daily. 180 tablet 3  . furosemide (LASIX) 20 MG tablet TAKE 1 TABLET BY MOUTH AS NEEDED FOR SWELLING OR WEIGHT GAIN OF 3 LBS OVERNIGHT OR 5 LBS IN 1 WEEK 30 tablet 1  . metoprolol succinate (TOPROL-XL) 50 MG 24 hr tablet Take 1 tablet (50 mg total) by mouth daily. Take with or immediately following a meal. 30 tablet 5  . sacubitril-valsartan (ENTRESTO) 49-51  MG Take 1 tablet by mouth 2 (two) times daily. 60 tablet 6  . spironolactone (ALDACTONE) 25 MG tablet TAKE 1/2 OF A TABLET BY MOUTH DAILY 45 tablet 3   No current facility-administered medications for this encounter.     No Known Allergies    Social History   Socioeconomic History  . Marital status: Divorced    Spouse name: Not on file  . Number of children: Not on file  . Years of education: Not on file  . Highest education level: Not on file  Social Needs  . Financial resource strain: Not on file  . Food insecurity -  worry: Not on file  . Food insecurity - inability: Not on file  . Transportation needs - medical: Not on file  . Transportation needs - non-medical: Not on file  Occupational History  . Not on file  Tobacco Use  . Smoking status: Former Games developer  . Smokeless tobacco: Never Used  Substance and Sexual Activity  . Alcohol use: No  . Drug use: No  . Sexual activity: Not on file  Other Topics Concern  . Not on file  Social History Narrative  . Not on file      Family History  Problem Relation Age of Onset  . CAD Father    ROS: All systems reviewed and negative except as per HPI.   Vitals:   01/11/18 1341  BP: 132/70  Pulse: 74  SpO2: 97%  Weight: 171 lb 1.9 oz (77.6 kg)   Wt Readings from Last 3 Encounters:  01/11/18 171 lb 1.9 oz (77.6 kg)  06/01/17 166 lb 4 oz (75.4 kg)  08/27/16 164 lb (74.4 kg)     PHYSICAL EXAM: General: NAD Neck: No JVD, no thyromegaly or thyroid nodule.  Lungs: Clear to auscultation bilaterally with normal respiratory effort. CV: Nondisplaced PMI.  Heart regular S1/S2, no S3/S4, no murmur.  No peripheral edema.  No carotid bruit.  Normal pedal pulses.  Abdomen: Soft, nontender, no hepatosplenomegaly, no distention.  Skin: Intact without lesions or rashes.  Neurologic: Alert and oriented x 3.  Psych: Normal affect. Extremities: No clubbing or cyanosis.  HEENT: Normal.   ASSESSMENT & PLAN:  1. Chronic systolic CHF: Nonischemic cardiomyopathy.  I suspect that this was a combination of tachy-mediated CMP + ETOH CMP.  He has quit drinking and he has remained in NSR.  Echo in 8/18 showed EF 50-55%.  Currently NYHA class I.  He is not volume overloaded on exam.  - Continue current Toprol XL, Entresto, and spironolactone.  There is no good data that it would be safe for him to stop his meds with improvement in cardiomyopathy.  - BMET today and again in 3 months.  - I will get an echo in 8/19 to make sure that EF remains normal.  2. Atrial  fibrillation: Paroxysmal.  He is in NSR, now off amiodarone.  - Continue apixaban, check CBC today.     Followup in 8/19 with echo.   [name] 01/11/2018

## 2018-01-11 NOTE — Patient Instructions (Signed)
Labs drawn today (if we do not call you, then your lab work was stable)   Your physician has requested that you have an echocardiogram. Echocardiography is a painless test that uses sound waves to create images of your heart. It provides your doctor with information about the size and shape of your heart and how well your heart's chambers and valves are working. This procedure takes approximately one hour. There are no restrictions for this procedure.  Your physician recommends that you schedule a follow-up appointment in: August 2019 with Dr. Shirlee Latch an a echocardiogram

## 2018-01-11 NOTE — Progress Notes (Signed)
Medication Samples have been provided to the patient.  Drug name: Sherryll Burger       Strength: 49/51        Qty: 4  LOT: [id number]  Exp.Date: 1/21  Dosing instructions: Take 1 tab twice a day  The patient has been instructed regarding the correct time, dose, and frequency of taking this medication, including desired effects and most common side effects.   Teresa Coombs 1:56 PM 01/11/2018

## 2018-01-13 ENCOUNTER — Other Ambulatory Visit (HOSPITAL_COMMUNITY): Payer: Self-pay | Admitting: Pharmacist

## 2018-01-13 MED ORDER — SACUBITRIL-VALSARTAN 49-51 MG PO TABS: 1.0000 | ORAL_TABLET | Freq: Two times a day (BID) | ORAL | 3 refills | Status: DC

## 2018-01-21 ENCOUNTER — Other Ambulatory Visit (HOSPITAL_COMMUNITY): Payer: Self-pay | Admitting: Internal Medicine

## 2018-01-22 ENCOUNTER — Other Ambulatory Visit (HOSPITAL_COMMUNITY): Payer: Self-pay | Admitting: *Deleted

## 2018-01-22 MED ORDER — FUROSEMIDE 20 MG PO TABS: ORAL_TABLET | ORAL | 3 refills | Status: DC

## 2018-01-27 ENCOUNTER — Telehealth (HOSPITAL_COMMUNITY): Payer: Self-pay | Admitting: Pharmacist

## 2018-01-27 NOTE — Telephone Encounter (Signed)
Received a call from [employer] that patient needs PA for Entresto from [employer]. I explained that [employer] will not cover any brand name medications but the rep told me that they still needed to attempted PA on file. Have submitted the PA and response from [facility] was that the medication was covered for him without prior authorization.   Tyler Deis. Bonnye Fava, PharmD, BCPS, CPP Clinical Pharmacist Phone: [phone] 01/27/2018 10:19 AM

## 2018-02-17 ENCOUNTER — Telehealth (HOSPITAL_COMMUNITY): Payer: Self-pay | Admitting: Pharmacist

## 2018-02-17 NOTE — Telephone Encounter (Signed)
[employer] patient assistance approved for Entresto 49-51 mg BID through 12/28/18.  Tyler Deis. Bonnye Fava, PharmD, BCPS, CPP Clinical Pharmacist Phone: [phone] 02/17/2018 9:49 AM

## 2018-03-31 ENCOUNTER — Other Ambulatory Visit (HOSPITAL_COMMUNITY): Payer: Self-pay | Admitting: Cardiology

## 2018-06-17 ENCOUNTER — Other Ambulatory Visit (HOSPITAL_COMMUNITY): Payer: Self-pay

## 2018-06-17 MED ORDER — SACUBITRIL-VALSARTAN 49-51 MG PO TABS: 1.0000 | ORAL_TABLET | Freq: Two times a day (BID) | ORAL | 2 refills | Status: DC

## 2018-07-23 ENCOUNTER — Ambulatory Visit (HOSPITAL_COMMUNITY)
Admission: RE | Admit: 2018-07-23 | Discharge: 2018-07-23 | Disposition: A | Discharge status: Home / Self Care | Payer: 59 | Source: Ambulatory Visit | Attending: Cardiology | Admitting: Cardiology

## 2018-07-23 ENCOUNTER — Encounter (HOSPITAL_COMMUNITY): Payer: Self-pay | Admitting: Cardiology

## 2018-07-23 VITALS — BP 118/72 | HR 67 | Wt 165.2 lb

## 2018-07-23 DIAGNOSIS — I5022 Chronic systolic (congestive) heart failure: Secondary | ICD-10-CM | POA: Diagnosis not present

## 2018-07-23 DIAGNOSIS — Z79899 Other long term (current) drug therapy: Secondary | ICD-10-CM | POA: Diagnosis not present

## 2018-07-23 DIAGNOSIS — Z7901 Long term (current) use of anticoagulants: Secondary | ICD-10-CM | POA: Diagnosis not present

## 2018-07-23 DIAGNOSIS — I480 Paroxysmal atrial fibrillation: Secondary | ICD-10-CM

## 2018-07-23 DIAGNOSIS — I361 Nonrheumatic tricuspid (valve) insufficiency: Secondary | ICD-10-CM | POA: Diagnosis not present

## 2018-07-23 DIAGNOSIS — Z87891 Personal history of nicotine dependence: Secondary | ICD-10-CM | POA: Insufficient documentation

## 2018-07-23 DIAGNOSIS — I429 Cardiomyopathy, unspecified: Secondary | ICD-10-CM | POA: Diagnosis not present

## 2018-07-23 DIAGNOSIS — J449 Chronic obstructive pulmonary disease, unspecified: Secondary | ICD-10-CM | POA: Insufficient documentation

## 2018-07-23 DIAGNOSIS — Z8249 Family history of ischemic heart disease and other diseases of the circulatory system: Secondary | ICD-10-CM | POA: Insufficient documentation

## 2018-07-23 LAB — CBC
HCT: 41.3 % (ref 39.0–52.0)
Hemoglobin: 13.8 g/dL (ref 13.0–17.0)
MCH: 30.4 pg (ref 26.0–34.0)
MCHC: 33.4 g/dL (ref 30.0–36.0)
MCV: 91 fL (ref 78.0–100.0)
Platelets: 293 10*3/uL (ref 150–400)
RBC: 4.54 MIL/uL (ref 4.22–5.81)
RDW: 13.4 % (ref 11.5–15.5)
WBC: 7.4 10*3/uL (ref 4.0–10.5)

## 2018-07-23 LAB — LIPID PANEL
Cholesterol: 218 mg/dL — ABNORMAL HIGH (ref 0–200)
HDL: 38 mg/dL — ABNORMAL LOW (ref >40)
LDL Cholesterol: 136 mg/dL — ABNORMAL HIGH (ref 0–99)
Total CHOL/HDL Ratio: 5.7 RATIO
Triglycerides: 220 mg/dL — ABNORMAL HIGH (ref <150)
VLDL: 44 mg/dL — ABNORMAL HIGH (ref 0–40)

## 2018-07-23 LAB — BASIC METABOLIC PANEL
Anion gap: 6 (ref 5–15)
BUN: 9 mg/dL (ref 8–23)
CO2: 30 mmol/L (ref 22–32)
Calcium: 9.8 mg/dL (ref 8.9–10.3)
Chloride: 105 mmol/L (ref 98–111)
Creatinine, Ser: 0.93 mg/dL (ref 0.61–1.24)
GFR calc Af Amer: >60 mL/min (ref >60)
GFR calc non Af Amer: >60 mL/min (ref >60)
Glucose, Bld: 110 mg/dL — ABNORMAL HIGH (ref 70–99)
Potassium: 5 mmol/L (ref 3.5–5.1)
Sodium: 141 mmol/L (ref 135–145)

## 2018-07-23 NOTE — Progress Notes (Signed)
Patient ID: Caleb Barnes, male   DOB: 04/15/1956, 62 y.o.   MRN: [id number]    Advanced Heart Failure Clinic Note   HF Cardiology: Dr. Shirlee Latch   HPI:  Caleb Barnes is a 62 y.o. male with h/o tobacco abuse and ETOH abuse who presented to [facility] ED on 03/31/16 with SOB. BNP and LFTs were elevated. Was found to be in atrial fibrillation with RVR. Echo 03/31/16 with reduced EF to 20-25%, mild MR, mild/mod reduced RV systolic function, Peak PA systolic 43 mm Hg. CTA chest with no PE. He was also placed on CIWA protocol for extensive ETOH use as an outpatient. GI consulted with elevated LFTs at [facility]. Abd [place] with gallbladder thickening but normal-appearing liver. He was started on amiodarone drip for atrial fibrillation. Diuresed with IV lasix 40 mg BID.  He was started on Dobutamine and Dopamine drips. Was transferred to [facility] for further evaluation and treatment with worsening condition. He was weaned off both dopamine and dobutamine in CCU and went back into NSR.  [facility] 04/04/16 showed low CO, mildly elevated filling pressures, and mild nonobstructive CAD. Started on milrinone with low cardiac output. 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 He tolerated milrinone wean and was discharged to home without inotropic support and in NSR.  Repeat echo in 8/17 showed EF up to 55-60%. Echo 8/18 showed EF 50-55%.    Echo was done today, shows EF 55-60%.    He presents today for followup of CHF.  He is staying off ETOH and cigarettes.  Still working full time.  No chest pain.  No exertional dyspnea.  No BRBPR/melena.  No palpitations.   ECG: personally reviewed. NSR, normal.   Labs (4/17): HCT 46.1 Labs (5/17): K 4.3, creatinine 0.98, LFTs normal Labs (7/17): K 4.4, creatinine 1.05 Labs (2/18): K 4.4, creatinine 1.5, LDL 137, hgb 12.7 Labs (6/18): TSH normal Labs (9/18): K 4.1, creatinine 1.09 Labs (1/19): K 3.6, creatinine 1.05, hgb 13.5.    PMH 1. Chronic systolic CHF: Nonischemic cardiomyopathy. Possible etiologies include tachy-mediated CMP with atrial fib/RVR of unknown duration and ETOH CMP. - R/LHC (4/17): Mild nonobstructive CAD; mean RA 7, PA 43/25, mean PCWP 21, CI 1.41 Fick and 1.78 thermodilution.  - Echo (4/17): EF < 20%, moderate LV dilation, mid to moderately decreased RV systolic function.  - Echo (8/17): EF 55-60%, moderate LVH, normal RV size and systolic function.  - Echo (8/18): EF 50-55%, PASP 28 mmHg - Echo (7/19): EF 55-60%, normal.  2. Atrial fibrillation: Paroxysmal. He is on amiodarone.  3. ETOH abuse: Has quit.  4. COPD  Current Outpatient Medications  Medication Sig Dispense Refill  . acetaminophen (TYLENOL) 325 MG tablet Take 650 mg by mouth every 6 (six) hours as needed for mild pain.    Marland Kitchen albuterol (PROVENTIL HFA;VENTOLIN HFA) 108 (90 Base) MCG/ACT inhaler Inhale 1 puff into the lungs every 4 (four) hours as needed for wheezing or shortness of breath. Reported on 07/08/2016    . apixaban (ELIQUIS) 5 MG TABS tablet Take 1 tablet (5 mg total) by mouth 2 (two) times daily. 180 tablet 3  . metoprolol succinate (TOPROL-XL) 50 MG 24 hr tablet Take 1 tablet (50 mg total) by mouth daily. Take with or immediately following a meal. 30 tablet 5  . sacubitril-valsartan (ENTRESTO) 49-51 MG Take 1 tablet by mouth 2 (two) times daily. 180 tablet 2  . spironolactone (ALDACTONE) 25 MG tablet TAKE 1/2  OF A TABLET BY MOUTH DAILY 45 tablet 2  . furosemide (LASIX) 20 MG tablet TAKE 1 TABLET BY MOUTH AS NEEDED FOR SWELLING OR WEIGHT GAIN OF 3 LBS OVERNIGHT OR 5 LBS IN 1 WEEK (Patient not taking: Reported on 07/23/2018) 90 tablet 3   No current facility-administered medications for this encounter.     No Known Allergies    Social History   Socioeconomic History  . Marital status: Divorced    Spouse name: Not on file  . Number of children: Not on file  . Years of education: Not on file  . Highest education  level: Not on file  Occupational History  . Not on file  Social Needs  . Financial resource strain: Not on file  . Food insecurity:    Worry: Not on file    Inability: Not on file  . Transportation needs:    Medical: Not on file    Non-medical: Not on file  Tobacco Use  . Smoking status: Former Games developer  . Smokeless tobacco: Never Used  Substance and Sexual Activity  . Alcohol use: No  . Drug use: No  . Sexual activity: Not on file  Lifestyle  . Physical activity:    Days per week: Not on file    Minutes per session: Not on file  . Stress: Not on file  Relationships  . Social connections:    Talks on phone: Not on file    Gets together: Not on file    Attends religious service: Not on file    Active member of club or organization: Not on file    Attends meetings of clubs or organizations: Not on file    Relationship status: Not on file  . Intimate partner violence:    Fear of current or ex partner: Not on file    Emotionally abused: Not on file    Physically abused: Not on file    Forced sexual activity: Not on file  Other Topics Concern  . Not on file  Social History Narrative  . Not on file      Family History  Problem Relation Age of Onset  . CAD Father    Review of systems complete and found to be negative unless listed in HPI.   Vitals:   07/23/18 1353  BP: 118/72  Pulse: 67  SpO2: 97%  Weight: 165 lb 3.2 oz (74.9 kg)   Wt Readings from Last 3 Encounters:  07/23/18 165 lb 3.2 oz (74.9 kg)  01/11/18 171 lb 1.9 oz (77.6 kg)  06/01/17 166 lb 4 oz (75.4 kg)     PHYSICAL EXAM: General: NAD Neck: No JVD, no thyromegaly or thyroid nodule.  Lungs: Clear to auscultation bilaterally with normal respiratory effort. CV: Nondisplaced PMI.  Heart regular S1/S2, no S3/S4, no murmur.  No peripheral edema.  No carotid bruit.  Normal pedal pulses.  Abdomen: Soft, nontender, no hepatosplenomegaly, no distention.  Skin: Intact without lesions or rashes.   Neurologic: Alert and oriented x 3.  Psych: Normal affect. Extremities: No clubbing or cyanosis.  HEENT: Normal.   ASSESSMENT & PLAN:  1. Chronic systolic CHF: Nonischemic cardiomyopathy.  I suspect that this was a combination of tachy-mediated CMP + ETOH CMP.  He has quit drinking and he has remained in NSR.  Echo in 8/18 showed EF 50-55%.  Echo was reviewed and shows EF 55-60%.  Currently NYHA class I.  He is not volume overloaded on exam.  - Continue current Toprol XL,  Entresto, and spironolactone.  There is no good data that it would be safe for him to stop his meds with improvement in cardiomyopathy.  - Check BMET today and will need BMET every 3 months.  2. Atrial fibrillation: Paroxysmal.  EKG today shows NSR - Continue apixaban. Denies bleeding.   - CBC today.   I will check lipids today since he does not have a PCP.  [name] 07/25/2018

## 2018-07-23 NOTE — Progress Notes (Signed)
  Echocardiogram 2D Echocardiogram has been performed.  Celene Skeen 07/23/2018, 1:42 PM

## 2018-07-23 NOTE — Patient Instructions (Signed)
Labs today (will call for abnormal results, otherwise no news is good news)  Labs in 3 months (bmet)  Follow up in 1 year, please call our clinic in May 2020 to schedule your follow up.  [phone], Opt. 3.

## 2018-10-25 ENCOUNTER — Ambulatory Visit (HOSPITAL_COMMUNITY)
Admission: RE | Admit: 2018-10-25 | Discharge: 2018-10-25 | Disposition: A | Discharge status: Home / Self Care | Payer: 59 | Source: Ambulatory Visit | Attending: Internal Medicine | Admitting: Internal Medicine

## 2018-10-25 DIAGNOSIS — I5022 Chronic systolic (congestive) heart failure: Secondary | ICD-10-CM | POA: Diagnosis not present

## 2018-10-25 LAB — BASIC METABOLIC PANEL
Anion gap: 6 (ref 5–15)
BUN: 15 mg/dL (ref 8–23)
CO2: 28 mmol/L (ref 22–32)
Calcium: 9.1 mg/dL (ref 8.9–10.3)
Chloride: 104 mmol/L (ref 98–111)
Creatinine, Ser: 1.22 mg/dL (ref 0.61–1.24)
GFR calc Af Amer: >60 mL/min (ref >60)
GFR calc non Af Amer: >60 mL/min (ref >60)
Glucose, Bld: 105 mg/dL — ABNORMAL HIGH (ref 70–99)
Potassium: 4 mmol/L (ref 3.5–5.1)
Sodium: 138 mmol/L (ref 135–145)

## 2018-11-29 ENCOUNTER — Other Ambulatory Visit (HOSPITAL_COMMUNITY): Payer: Self-pay | Admitting: Cardiology

## 2018-12-10 ENCOUNTER — Other Ambulatory Visit (HOSPITAL_COMMUNITY): Payer: Self-pay | Admitting: Cardiology

## 2018-12-14 ENCOUNTER — Other Ambulatory Visit (HOSPITAL_COMMUNITY): Payer: Self-pay

## 2018-12-15 ENCOUNTER — Other Ambulatory Visit (HOSPITAL_COMMUNITY): Payer: Self-pay

## 2018-12-15 MED ORDER — SACUBITRIL-VALSARTAN 49-51 MG PO TABS: 1.0000 | ORAL_TABLET | Freq: Two times a day (BID) | ORAL | 1 refills | Status: DC

## 2018-12-15 MED ORDER — APIXABAN 5 MG PO TABS: 5.0000 mg | ORAL_TABLET | Freq: Two times a day (BID) | ORAL | 1 refills | Status: DC

## 2018-12-27 ENCOUNTER — Telehealth (HOSPITAL_COMMUNITY): Payer: Self-pay

## 2018-12-27 ENCOUNTER — Telehealth (HOSPITAL_COMMUNITY): Payer: Self-pay | Admitting: Licensed Clinical Social Worker

## 2018-12-27 MED ORDER — APIXABAN 5 MG PO TABS: 5.0000 mg | ORAL_TABLET | Freq: Two times a day (BID) | ORAL | 1 refills | Status: DC

## 2018-12-27 MED ORDER — SACUBITRIL-VALSARTAN 49-51 MG PO TABS: 1.0000 | ORAL_TABLET | Freq: Two times a day (BID) | ORAL | 1 refills | Status: DC

## 2018-12-27 NOTE — Telephone Encounter (Signed)
Application for [employer] Patient Assistance [employer] to assist with Eliquis completed and faxed in- fax confirmation received  Application for [employer] Patient Assistance to assist with Sherryll Burger completed and faxed in- fax confirmation received.  CSW will continue to follow and assist as needed  Burna Sis, LCSW Clinical Social Worker Advanced Heart Failure Clinic [phone]

## 2018-12-27 NOTE — Telephone Encounter (Signed)
Opened to print script

## 2018-12-27 NOTE — Telephone Encounter (Signed)
Open to print script  

## 2019-01-04 ENCOUNTER — Telehealth (HOSPITAL_COMMUNITY): Payer: Self-pay | Admitting: Licensed Clinical Social Worker

## 2019-01-04 NOTE — Telephone Encounter (Signed)
Fax received stating that pt has been approved for eliquis through [employer] until 01/05/2020.  CSW called pt to inform and provided with number to foundation to confirm that medication will be shipped directly to him.  CSW will continue to follow and assist as needed- [employer] application still pending for Entresto coverage  Burna Sis, LCSW Clinical Social Worker Advanced [facility] [phone]

## 2019-05-26 ENCOUNTER — Other Ambulatory Visit (HOSPITAL_COMMUNITY): Payer: Self-pay | Admitting: Cardiology

## 2019-05-31 ENCOUNTER — Telehealth (HOSPITAL_COMMUNITY): Payer: Self-pay | Admitting: Licensed Clinical Social Worker

## 2019-05-31 NOTE — Telephone Encounter (Signed)
Clinic received notification from [employer] that patient needs to pursue alternative funding options to continue receiving his Entresto.  Notification did not clarify where pt should pursue alternative funding so CSW called foundation to inquire.  Foundation could not identify alternative funding source so have now approved patient to receive Entresto through 12/29/19  CSW called pt and left message informing of approval  CSW will continue to follow and assist as needed  Burna Sis, LCSW Clinical Social Worker Advanced [facility] Failure Clinic Desk#: [phone] Cell#: [phone]

## 2019-09-29 ENCOUNTER — Other Ambulatory Visit (HOSPITAL_COMMUNITY): Payer: Self-pay | Admitting: Cardiology

## 2019-11-14 ENCOUNTER — Telehealth (HOSPITAL_COMMUNITY): Payer: Self-pay | Admitting: Pharmacy Technician

## 2019-11-14 NOTE — Telephone Encounter (Signed)
Called patient to inform him that it was time to renew his BMS application for Elquis assistance. Left voicemail.  Will follow up.  Charlann Boxer, CPhT

## 2019-11-16 ENCOUNTER — Telehealth (HOSPITAL_COMMUNITY): Payer: Self-pay | Admitting: Pharmacist

## 2019-11-16 MED ORDER — APIXABAN 5 MG PO TABS: 5.0000 mg | ORAL_TABLET | Freq: Two times a day (BID) | ORAL | 3 refills | Status: DC

## 2019-11-16 MED ORDER — ENTRESTO 49-51 MG PO TABS: 1.0000 | ORAL_TABLET | Freq: Two times a day (BID) | ORAL | 3 refills | Status: DC

## 2019-11-16 NOTE — Telephone Encounter (Signed)
Prescriptions for Delene Loll and Eliquis sent to [facility] per patient request.

## 2019-11-16 NOTE — Telephone Encounter (Signed)
Was able to sign patient up for $10 co-pay cards for both Entresto and Eliquis.  Pharmacy Billing Information ([place])  BIN: [phone]  PCN: [id number]  Group: [id number]  ID: [id number]  Pharmacy Billing Information [facility])  BIN: [id number]  PCN: [id number]  Group: [id number]  ID: [id number]  Called and spoke with patient regard co-pay cards. He understands that we will not go back through the manufacturers at this time. After December he will call me and I will mail his prescriptions from our Specialty Pharmacy that is housed at [facility], paying $20.00 total for both medications.   Charlann Boxer, CPhT

## 2019-11-23 ENCOUNTER — Other Ambulatory Visit (HOSPITAL_COMMUNITY): Payer: Self-pay | Admitting: Cardiology

## 2019-11-28 ENCOUNTER — Other Ambulatory Visit (HOSPITAL_COMMUNITY): Payer: Self-pay

## 2019-11-28 MED ORDER — APIXABAN 5 MG PO TABS: 5.0000 mg | ORAL_TABLET | Freq: Two times a day (BID) | ORAL | 3 refills | Status: DC

## 2019-12-16 ENCOUNTER — Other Ambulatory Visit (HOSPITAL_COMMUNITY): Payer: Self-pay | Admitting: Cardiology

## 2020-02-02 ENCOUNTER — Telehealth (HOSPITAL_COMMUNITY): Payer: Self-pay | Admitting: Pharmacy Technician

## 2020-02-02 NOTE — Telephone Encounter (Signed)
Patient called in about getting Entresto sent from [facility] to his home. He provided new insurance information that I will ask the buyer at [facility] to enter in. If I have other issues, he was able to provide me with the phone number to customer service.   Will follow up with patient.  Archer Asa, CPhT

## 2020-02-03 NOTE — Telephone Encounter (Signed)
Spoke with patient. We are having a hard time getting the pharmacy information provided to work in our system. Patient is going to speak with a benefits representative and send me a picture of the card. Will look at processing information at that time.  Archer Asa, CPhT

## 2020-02-06 NOTE — Telephone Encounter (Signed)
Patient called to see if I received communication from the benefits rep with his employer, I have not. He is going to get her to call me tomorrow.  Archer Asa, CPhT

## 2020-02-07 MED FILL — ENTRESTO 49 MG-51 MG TABLET: 49-51 | 30 days supply | Qty: 60 | Fill #0

## 2020-02-07 MED FILL — ELIQUIS 5 MG TABLET: 5 | 30 days supply | Qty: 60 | Fill #0

## 2020-02-07 NOTE — Telephone Encounter (Signed)
Spoke with representative who gave updated pharmacy information. Was able to get Entresto and Eliquis billed and will mail it out to him.  Archer Asa, CPhT

## 2020-03-12 MED FILL — ENTRESTO 49 MG-51 MG TABLET: 49-51 | 30 days supply | Qty: 60 | Fill #1

## 2020-03-12 MED FILL — ELIQUIS 5 MG TABLET: 5 | 30 days supply | Qty: 60 | Fill #1

## 2020-04-09 MED FILL — ELIQUIS 5 MG TABLET: 5 | 30 days supply | Qty: 60 | Fill #2

## 2020-04-09 MED FILL — ENTRESTO 49 MG-51 MG TABLET: 49-51 | 30 days supply | Qty: 60 | Fill #2

## 2020-06-12 ENCOUNTER — Other Ambulatory Visit (HOSPITAL_COMMUNITY): Payer: Self-pay | Admitting: Cardiology

## 2020-06-14 ENCOUNTER — Other Ambulatory Visit (HOSPITAL_COMMUNITY): Payer: Self-pay | Admitting: Cardiology

## 2020-07-03 ENCOUNTER — Other Ambulatory Visit (HOSPITAL_COMMUNITY): Payer: Self-pay | Admitting: Cardiology

## 2020-07-09 MED FILL — ENTRESTO 49 MG-51 MG TABLET: 49-51 | 30 days supply | Qty: 60 | Fill #5

## 2020-07-09 MED FILL — ELIQUIS 5 MG TABLET: 5 | 30 days supply | Qty: 60 | Fill #5

## 2020-07-10 ENCOUNTER — Encounter (HOSPITAL_COMMUNITY): Payer: Self-pay | Admitting: Cardiology

## 2020-07-10 ENCOUNTER — Other Ambulatory Visit (HOSPITAL_COMMUNITY): Payer: Self-pay | Admitting: Cardiology

## 2020-08-06 ENCOUNTER — Other Ambulatory Visit (HOSPITAL_COMMUNITY): Payer: Self-pay | Admitting: Cardiology

## 2020-08-07 MED FILL — ENTRESTO 49 MG-51 MG TABLET: 49-51 | 30 days supply | Qty: 60 | Fill #6

## 2020-08-07 MED FILL — ELIQUIS 5 MG TABLET: 5 | 30 days supply | Qty: 60 | Fill #6

## 2020-09-05 ENCOUNTER — Other Ambulatory Visit (HOSPITAL_COMMUNITY): Payer: Self-pay | Admitting: Cardiology

## 2020-09-05 MED FILL — ELIQUIS 5 MG TABLET: 5 | 30 days supply | Qty: 60 | Fill #7

## 2020-09-05 MED FILL — ENTRESTO 49 MG-51 MG TABLET: 49-51 | 30 days supply | Qty: 60 | Fill #7

## 2020-09-26 ENCOUNTER — Ambulatory Visit (HOSPITAL_COMMUNITY)
Admission: RE | Admit: 2020-09-26 | Discharge: 2020-09-26 | Disposition: A | Discharge status: Home / Self Care | Payer: 59 | Source: Ambulatory Visit | Attending: Cardiology | Admitting: Cardiology

## 2020-09-26 ENCOUNTER — Other Ambulatory Visit: Payer: Self-pay

## 2020-09-26 ENCOUNTER — Encounter (HOSPITAL_COMMUNITY): Payer: Self-pay | Admitting: Cardiology

## 2020-09-26 VITALS — BP 120/70 | HR 70 | Ht 71.0 in | Wt 170.8 lb

## 2020-09-26 DIAGNOSIS — I2510 Atherosclerotic heart disease of native coronary artery without angina pectoris: Secondary | ICD-10-CM | POA: Diagnosis not present

## 2020-09-26 DIAGNOSIS — I428 Other cardiomyopathies: Secondary | ICD-10-CM | POA: Diagnosis not present

## 2020-09-26 DIAGNOSIS — Z8249 Family history of ischemic heart disease and other diseases of the circulatory system: Secondary | ICD-10-CM | POA: Diagnosis not present

## 2020-09-26 DIAGNOSIS — R7989 Other specified abnormal findings of blood chemistry: Secondary | ICD-10-CM | POA: Diagnosis not present

## 2020-09-26 DIAGNOSIS — I480 Paroxysmal atrial fibrillation: Secondary | ICD-10-CM | POA: Diagnosis not present

## 2020-09-26 DIAGNOSIS — Z7901 Long term (current) use of anticoagulants: Secondary | ICD-10-CM | POA: Diagnosis not present

## 2020-09-26 DIAGNOSIS — Z79899 Other long term (current) drug therapy: Secondary | ICD-10-CM | POA: Diagnosis not present

## 2020-09-26 DIAGNOSIS — Z87891 Personal history of nicotine dependence: Secondary | ICD-10-CM | POA: Diagnosis not present

## 2020-09-26 DIAGNOSIS — I5022 Chronic systolic (congestive) heart failure: Secondary | ICD-10-CM

## 2020-09-26 LAB — BASIC METABOLIC PANEL
Anion gap: 12 (ref 5–15)
BUN: 10 mg/dL (ref 8–23)
CO2: 25 mmol/L (ref 22–32)
Calcium: 9.4 mg/dL (ref 8.9–10.3)
Chloride: 102 mmol/L (ref 98–111)
Creatinine, Ser: 0.93 mg/dL (ref 0.61–1.24)
GFR calc Af Amer: >60 mL/min (ref >60)
GFR calc non Af Amer: >60 mL/min (ref >60)
Glucose, Bld: 118 mg/dL — ABNORMAL HIGH (ref 70–99)
Potassium: 4.2 mmol/L (ref 3.5–5.1)
Sodium: 139 mmol/L (ref 135–145)

## 2020-09-26 LAB — CBC
HCT: 44.3 % (ref 39.0–52.0)
Hemoglobin: 14.7 g/dL (ref 13.0–17.0)
MCH: 31.8 pg (ref 26.0–34.0)
MCHC: 33.2 g/dL (ref 30.0–36.0)
MCV: 95.9 fL (ref 80.0–100.0)
Platelets: 295 10*3/uL (ref 150–400)
RBC: 4.62 MIL/uL (ref 4.22–5.81)
RDW: 13.2 % (ref 11.5–15.5)
WBC: 5.8 10*3/uL (ref 4.0–10.5)
nRBC: 0 % (ref 0.0–0.2)

## 2020-09-26 LAB — LIPID PANEL
Cholesterol: 243 mg/dL — ABNORMAL HIGH (ref 0–200)
HDL: 46 mg/dL (ref >40)
LDL Cholesterol: 172 mg/dL — ABNORMAL HIGH (ref 0–99)
Total CHOL/HDL Ratio: 5.3 RATIO
Triglycerides: 126 mg/dL (ref <150)
VLDL: 25 mg/dL (ref 0–40)

## 2020-09-26 MED ORDER — SPIRONOLACTONE 25 MG PO TABS: 12.5000 mg | ORAL_TABLET | Freq: Every day | ORAL | 3 refills | Status: DC

## 2020-09-26 NOTE — Patient Instructions (Signed)
It was great to see you today! No medication changes are needed at this time.  Labs today We will only contact you if something comes back abnormal or we need to make some changes. Otherwise no news is good news!  Labs needed in 3 months and again every 3 months for one year  Your physician has requested that you have an echocardiogram. Echocardiography is a painless test that uses sound waves to create images of your heart. It provides your doctor with information about the size and shape of your heart and how well your heart's chambers and valves are working. This procedure takes approximately one hour. There are no restrictions for this procedure.  You have been referred to [facility]  Dr Dallas Schimke Address: [address] [place], [place] [address] Phone: [phone] -they will be in touch with an appointment  Your physician recommends that you schedule a follow-up appointment in: 1 year with Dr Shirlee Latch  If you have any questions or concerns before your next appointment please send [place] a message through [facility] or call our office at [phone].    TO LEAVE A MESSAGE FOR THE NURSE SELECT OPTION 2, PLEASE LEAVE A MESSAGE INCLUDING: . YOUR NAME . DATE OF BIRTH . CALL BACK NUMBER . REASON FOR CALL**this is important as we prioritize the call backs  YOU WILL RECEIVE A CALL BACK THE SAME DAY AS LONG AS YOU CALL BEFORE 4:00 PM

## 2020-09-26 NOTE — Progress Notes (Signed)
Patient ID: Caleb Barnes, male   DOB: 09-20-1956, 64 y.o.   MRN: [id number]    Advanced Heart Failure Clinic Note   HF Cardiology: Dr. Shirlee Latch   HPI:  Caleb Barnes is a 64 y.o. male with h/o tobacco abuse and ETOH abuse who presented to [facility] ED on 03/31/16 with SOB. BNP and LFTs were elevated. Was found to be in atrial fibrillation with RVR. Echo 03/31/16 with reduced EF to 20-25%, mild MR, mild/mod reduced RV systolic function, Peak PA systolic 43 mm Hg. CTA chest with no PE. He was also placed on CIWA protocol for extensive ETOH use as an outpatient. GI consulted with elevated LFTs at [facility]. Abd [place] with gallbladder thickening but normal-appearing liver. He was started on amiodarone drip for atrial fibrillation. Diuresed with IV lasix 40 mg BID.  He was started on Dobutamine and Dopamine drips. Was transferred to [facility] for further evaluation and treatment with worsening condition. He was weaned off both dopamine and dobutamine in CCU and went back into NSR.  [facility] 04/04/16 showed low CO, mildly elevated filling pressures, and mild nonobstructive CAD. Started on milrinone with low cardiac output. 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 He tolerated milrinone wean and was discharged to home without inotropic support and in NSR.  Repeat echo in 8/17 showed EF up to 55-60%. Echo 8/18 showed EF 50-55%.    Echo in 7/19 showed EF 55-60%.    He presents today for followup of CHF.  He is staying off ETOH and cigarettes.  Still working full time in a warehouse.  No chest pain.  No exertional dyspnea.  No palpitations, he is in NSR today.  No orthopnea/PND.    ECG: personally reviewed. NSR, normal.   Labs (4/17): HCT 46.1 Labs (5/17): K 4.3, creatinine 0.98, LFTs normal Labs (7/17): K 4.4, creatinine 1.05 Labs (2/18): K 4.4, creatinine 1.5, LDL 137, hgb 12.7 Labs (6/18): TSH normal Labs (9/18): K 4.1, creatinine 1.09 Labs (1/19): K 3.6,  creatinine 1.05, hgb 13.5.  Labs (7/19): LDL 136, K 4, creatinine 1.22  PMH 1. Chronic systolic CHF: Nonischemic cardiomyopathy. Possible etiologies include tachy-mediated CMP with atrial fib/RVR of unknown duration and ETOH CMP. - R/LHC (4/17): Mild nonobstructive CAD; mean RA 7, PA 43/25, mean PCWP 21, CI 1.41 Fick and 1.78 thermodilution.  - Echo (4/17): EF < 20%, moderate LV dilation, mid to moderately decreased RV systolic function.  - Echo (8/17): EF 55-60%, moderate LVH, normal RV size and systolic function.  - Echo (8/18): EF 50-55%, PASP 28 mmHg - Echo (7/19): EF 55-60%, normal.  2. Atrial fibrillation: Paroxysmal. He is on amiodarone.  3. ETOH abuse: Has quit.  4. COPD  Current Outpatient Medications  Medication Sig Dispense Refill  . acetaminophen (TYLENOL) 325 MG tablet Take 650 mg by mouth every 6 (six) hours as needed for mild pain.    Marland Kitchen apixaban (ELIQUIS) 5 MG TABS tablet Take 1 tablet (5 mg total) by mouth 2 (two) times daily. 180 tablet 3  . metoprolol succinate (TOPROL-XL) 50 MG 24 hr tablet TAKE 1 TABLET (50 MG TOTAL) BY MOUTH DAILY. MUST BE SEEN IN OFFICE FOR FURTHER REFILLS 30 tablet 0  . sacubitril-valsartan (ENTRESTO) 49-51 MG Take 1 tablet by mouth 2 (two) times daily. 180 tablet 3  . spironolactone (ALDACTONE) 25 MG tablet Take 0.5 tablets (12.5 mg total) by mouth daily. 45 tablet 3  . albuterol (PROVENTIL HFA;VENTOLIN HFA) 108 (  90 Base) MCG/ACT inhaler Inhale 1 puff into the lungs every 4 (four) hours as needed for wheezing or shortness of breath. Reported on 07/08/2016 (Patient not taking: Reported on 09/26/2020)     No current facility-administered medications for this encounter.    No Known Allergies    Social History   Socioeconomic History  . Marital status: Divorced    Spouse name: Not on file  . Number of children: Not on file  . Years of education: Not on file  . Highest education level: Not on file  Occupational History  . Not on file  Tobacco  Use  . Smoking status: Former Games developer  . Smokeless tobacco: Never Used  Substance and Sexual Activity  . Alcohol use: No  . Drug use: No  . Sexual activity: Not on file  Other Topics Concern  . Not on file  Social History Narrative  . Not on file   Social Determinants of Health   Financial Resource Strain:   . Difficulty of Paying Living Expenses: Not on file  Food Insecurity:   . Worried About Programme researcher, broadcasting/film/video in the Last Year: Not on file  . Ran Out of Food in the Last Year: Not on file  Transportation Needs:   . Lack of Transportation (Medical): Not on file  . Lack of Transportation (Non-Medical): Not on file  Physical Activity:   . Days of Exercise per Week: Not on file  . Minutes of Exercise per Session: Not on file  Stress:   . Feeling of Stress : Not on file  Social Connections:   . Frequency of Communication with Friends and Family: Not on file  . Frequency of Social Gatherings with Friends and Family: Not on file  . Attends Religious Services: Not on file  . Active Member of Clubs or Organizations: Not on file  . Attends Banker Meetings: Not on file  . Marital Status: Not on file  Intimate Partner Violence:   . Fear of Current or Ex-Partner: Not on file  . Emotionally Abused: Not on file  . Physically Abused: Not on file  . Sexually Abused: Not on file      Family History  Problem Relation Age of Onset  . CAD Father    Review of systems complete and found to be negative unless listed in HPI.   Vitals:   09/26/20 1004  BP: 120/70  Pulse: 70  SpO2: 97%  Weight: 77.5 kg (170 lb 12.8 oz)  Height: 5\' 11"  (1.803 m)   Wt Readings from Last 3 Encounters:  09/26/20 77.5 kg (170 lb 12.8 oz)  07/23/18 74.9 kg (165 lb 3.2 oz)  01/11/18 77.6 kg (171 lb 1.9 oz)     PHYSICAL EXAM: General: NAD Neck: No JVD, no thyromegaly or thyroid nodule.  Lungs: Clear to auscultation bilaterally with normal respiratory effort. CV: Nondisplaced PMI.  Heart  regular S1/S2, no S3/S4, no murmur.  No peripheral edema.  No carotid bruit.  Normal pedal pulses.  Abdomen: Soft, nontender, no hepatosplenomegaly, no distention.  Skin: Intact without lesions or rashes.  Neurologic: Alert and oriented x 3.  Psych: Normal affect. Extremities: No clubbing or cyanosis.  HEENT: Normal.   ASSESSMENT & PLAN:  1. Chronic systolic CHF: Nonischemic cardiomyopathy.  I suspect that this was a combination of tachy-mediated CMP + ETOH CMP.  He has quit drinking and he has remained in NSR.  Echo in 8/18 showed EF 50-55%.  Echo in 7/19 showed EF  55-60%.  Currently NYHA class I.  He is not volume overloaded on exam.  - Continue current Toprol XL, Entresto, and spironolactone.  There is no good data that it would be safe for him to stop his meds with improvement in cardiomyopathy.  - Check BMET today and will need BMET every 3 months.  - I will obtain repeat echo.  If EF remains normal, can hold off on further echoes unless symptoms change.  2. Atrial fibrillation: Paroxysmal.  EKG today shows NSR - Continue apixaban. Denies bleeding.   - CBC today.   I will check lipids today since he does not have a PCP.  I will also refer him for colon cancer screening (never done).  I will refer him to a PCP at [facility].   [name] 09/26/2020

## 2020-09-28 ENCOUNTER — Telehealth (HOSPITAL_COMMUNITY): Payer: Self-pay

## 2020-09-28 DIAGNOSIS — E785 Hyperlipidemia, unspecified: Secondary | ICD-10-CM

## 2020-09-28 MED ORDER — ROSUVASTATIN CALCIUM 10 MG PO TABS: 10.0000 mg | ORAL_TABLET | Freq: Every day | ORAL | 3 refills | Status: DC

## 2020-09-28 NOTE — Telephone Encounter (Signed)
-----   Message from Laurey Morale, MD sent at 09/26/2020  3:50 PM EDT ----- LDL is very high.  He will need to start a statin.  Would use Crestor 10 mg daily with lipids/LFTs checked in 2 months.

## 2020-09-28 NOTE — Telephone Encounter (Signed)
Samara Snide, RN  09/28/2020 5:10 PM EDT Back to Top    Patient advised and verbalized understanding. Med list updated. Lab orders placed and sch   Philicia R Branch, CMA  09/27/2020 1:53 PM EDT     [facility]

## 2020-10-08 MED FILL — ELIQUIS 5 MG TABLET: 5 | 30 days supply | Qty: 60 | Fill #8

## 2020-10-08 MED FILL — ENTRESTO 49 MG-51 MG TABLET: 49-51 | 30 days supply | Qty: 60 | Fill #8

## 2020-10-11 ENCOUNTER — Ambulatory Visit (HOSPITAL_COMMUNITY)
Admission: RE | Admit: 2020-10-11 | Discharge: 2020-10-11 | Disposition: A | Discharge status: Home / Self Care | Payer: 59 | Source: Ambulatory Visit | Attending: Cardiology | Admitting: Cardiology

## 2020-10-11 ENCOUNTER — Other Ambulatory Visit: Payer: Self-pay

## 2020-10-11 DIAGNOSIS — F17200 Nicotine dependence, unspecified, uncomplicated: Secondary | ICD-10-CM | POA: Diagnosis not present

## 2020-10-11 DIAGNOSIS — J449 Chronic obstructive pulmonary disease, unspecified: Secondary | ICD-10-CM | POA: Insufficient documentation

## 2020-10-11 DIAGNOSIS — I059 Rheumatic mitral valve disease, unspecified: Secondary | ICD-10-CM | POA: Insufficient documentation

## 2020-10-11 DIAGNOSIS — I4891 Unspecified atrial fibrillation: Secondary | ICD-10-CM | POA: Insufficient documentation

## 2020-10-11 DIAGNOSIS — I5022 Chronic systolic (congestive) heart failure: Secondary | ICD-10-CM | POA: Diagnosis not present

## 2020-10-11 LAB — ECHOCARDIOGRAM COMPLETE
Area-P 1/2: 3.48 cm2
S' Lateral: 3.2 cm

## 2020-10-11 NOTE — Progress Notes (Signed)
  Echocardiogram 2D Echocardiogram has been performed.  Caleb Barnes 10/11/2020, 1:27 PM

## 2020-10-18 ENCOUNTER — Other Ambulatory Visit (HOSPITAL_COMMUNITY): Payer: Self-pay | Admitting: Cardiology

## 2020-11-08 MED FILL — ENTRESTO 49 MG-51 MG TABLET: 49-51 | 30 days supply | Qty: 60 | Fill #9

## 2020-11-08 MED FILL — ELIQUIS 5 MG TABLET: 5 | 30 days supply | Qty: 60 | Fill #9

## 2020-11-09 ENCOUNTER — Other Ambulatory Visit: Payer: Self-pay | Admitting: Family Medicine

## 2020-11-09 ENCOUNTER — Telehealth: Payer: Self-pay | Admitting: Family Medicine

## 2020-11-09 ENCOUNTER — Ambulatory Visit (INDEPENDENT_AMBULATORY_CARE_PROVIDER_SITE_OTHER): Payer: 59 | Admitting: Family Medicine

## 2020-11-09 ENCOUNTER — Other Ambulatory Visit: Payer: Self-pay

## 2020-11-09 ENCOUNTER — Encounter: Payer: Self-pay | Admitting: Family Medicine

## 2020-11-09 VITALS — BP 118/65 | HR 72 | Temp 98.4°F | Ht 70.0 in | Wt 171.0 lb

## 2020-11-09 DIAGNOSIS — F419 Anxiety disorder, unspecified: Secondary | ICD-10-CM | POA: Diagnosis not present

## 2020-11-09 DIAGNOSIS — Z87891 Personal history of nicotine dependence: Secondary | ICD-10-CM

## 2020-11-09 DIAGNOSIS — Z125 Encounter for screening for malignant neoplasm of prostate: Secondary | ICD-10-CM | POA: Diagnosis not present

## 2020-11-09 DIAGNOSIS — R7309 Other abnormal glucose: Secondary | ICD-10-CM

## 2020-11-09 DIAGNOSIS — Z7689 Persons encountering health services in other specified circumstances: Secondary | ICD-10-CM

## 2020-11-09 DIAGNOSIS — Z1211 Encounter for screening for malignant neoplasm of colon: Secondary | ICD-10-CM

## 2020-11-09 DIAGNOSIS — Z28310 Unvaccinated for covid-19: Secondary | ICD-10-CM | POA: Insufficient documentation

## 2020-11-09 DIAGNOSIS — Z2821 Immunization not carried out because of patient refusal: Secondary | ICD-10-CM

## 2020-11-09 DIAGNOSIS — Z23 Encounter for immunization: Secondary | ICD-10-CM | POA: Diagnosis not present

## 2020-11-09 DIAGNOSIS — I480 Paroxysmal atrial fibrillation: Secondary | ICD-10-CM | POA: Diagnosis not present

## 2020-11-09 DIAGNOSIS — F1011 Alcohol abuse, in remission: Secondary | ICD-10-CM

## 2020-11-09 LAB — PSA: PSA: 0.13 ng/mL (ref 0.10–4.00)

## 2020-11-09 LAB — HEMOGLOBIN A1C: Hgb A1c MFr Bld: 6.2 % (ref 4.6–6.5)

## 2020-11-09 LAB — TSH: TSH: 1.38 u[IU]/mL (ref 0.35–4.50)

## 2020-11-09 MED ORDER — ESCITALOPRAM OXALATE 10 MG PO TABS: 10.0000 mg | ORAL_TABLET | Freq: Every day | ORAL | 2 refills | Status: DC

## 2020-11-09 NOTE — Telephone Encounter (Signed)
Pt says he found out his insurance does cover the Cologaurd so you can go ahead and order for him.  Thank you!

## 2020-11-09 NOTE — Telephone Encounter (Signed)
Order placed, faxed.

## 2020-11-09 NOTE — Patient Instructions (Addendum)
Good to see you today  Work on increasing fruits and vegetables, try to work on decreasing your processed and packaged foods. Try to take your lunch to work and work on cutting back on [employer].   Follow up in two months  I have sent in a medication for you to take for your anxiety, take one table in the evening, let me know if any problems

## 2020-11-09 NOTE — Progress Notes (Signed)
Subjective:    Patient ID: Caleb Barnes, male    DOB: 1956/01/16, 64 y.o.   MRN: [id number]  HPI Chief Complaint  Patient presents with  . Establish Care    no PCP in several years    This is a 64 yo male who presents today to establish care. He has been recently seen at [facility]. 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 High stress. Works in [employer], enjoys his work. No children, has a Development worker, international aid [facility])  Last CPE- not for many years PSA- 2019 Colonoscopy- Cologuard 3-4 years ago Tdap- unknown Covid- has not had, waiting and seeing Flu- today Dental- dentures Eye- last yea at [facility], vision getting a little worse, ? Early cataract  Exercise- walking, squatting at work ETOH/ cigarettes- has quit Sleep- usually ok, will have a couple of times a month where he wakes up and can't go back to sleep.   Afib with heart failure- admitted to [facility] and then transferred to [facility] 4/21. Repeat echo 8/21- improved EF. He quit drinking and smoking. Last cardiology appointment 09/18/2020.  Has been in sinus rhythm.  History of COPD-was on Spiriva in the past, he reports that he is not having any problems with his breathing, no wheeze, cough, shortness of breath.  I do not believe he has had any lung cancer screening.  History of gout- no recent flares  Anxiety- increased irritability, stress  Diet- breakfast- eggs, sausage, lunch- sandwich, potato chips dinner- home cooking- meat, vegetables. Drinks water, coffee with cream and sugar, soda, bottle, a day.  Review of Systems No headaches, chest pain, SOB, nausea/ vomiting, diarrhea/ constipation, dysuria, hematuria, muscle or joint pain.     Objective:   Physical Exam Physical Exam  Constitutional: Oriented to person, place, and time. Appears well-developed and well-nourished.  HENT:  Head: Normocephalic and atraumatic.   Eyes: Conjunctivae are normal.  Neck: Normal range of motion. Neck supple.  Cardiovascular: Normal rate, regular rhythm and normal heart sounds.   Pulmonary/Chest: Effort normal and breath sounds normal.  Musculoskeletal: No lower extremity edema.   Neurological: Alert and oriented to person, place, and time.  Skin: Skin is warm and dry.  Psychiatric: Normal mood and affect. Behavior is normal. Judgment and thought content normal.  Vitals reviewed.     BP 118/65   Pulse 72   Temp 98.4 F (36.9 C) (Temporal)   Ht 5\' 10"  (1.778 m)   Wt 171 lb (77.6 kg)   SpO2 95%   BMI 24.54 kg/m  Wt Readings from Last 3 Encounters:  11/09/20 171 lb (77.6 kg)  09/26/20 170 lb 12.8 oz (77.5 kg)  07/23/18 165 lb 3.2 oz (74.9 kg)   GAD 7 : Generalized Anxiety Score 11/09/2020  Nervous, Anxious, on Edge 1  Control/stop worrying 2  Worry too much - different things 1  Trouble relaxing 1  Restless 2  Easily annoyed or irritable 2  Afraid - awful might happen 2  Total GAD 7 Score 11    Depression screen PHQ 2/9 11/09/2020  Decreased Interest 1  Down, Depressed, Hopeless 1  PHQ - 2 Score 2  Altered sleeping 1  Tired, decreased energy 1  Change in appetite 0  Feeling bad or failure about yourself  1  Trouble concentrating 1  Moving slowly or fidgety/restless 1  Suicidal thoughts 0  PHQ-9 Score 7  Assessment & Plan:  1. Encounter to establish care -Reviewed available records in EMR, K PN, overdue health maintenance.  2. Need for influenza vaccination - Flu Vaccine QUAD 6+ mos PF IM (Fluarix Quad PF)  3. Anxiety -He is under tremendous amount of stress with taking care of his wife and his recent health issues.  Discussed pharmacologic interventions which he is interested in as well as nonpharmacologic interventions. -Follow-up in 6 weeks - escitalopram (LEXAPRO) 10 MG tablet; Take 1 tablet (10 mg total) by mouth daily.  Dispense: 30 tablet; Refill: 2  4. Paroxysmal atrial  fibrillation (HCC) -Currently in sinus rhythm, continue follow-up per cardiology - TSH  5. Screening for prostate cancer - PSA  6. Elevated glucose - Hemoglobin A1c  7. Screening for colon cancer -He is interested in having Cologuard test and he will call his insurance company to see if it is covered and let me know  8. COVID-19 vaccine series declined -Discussed safety of vaccine and his increased risk of negative outcomes.  9. History of tobacco abuse - Ambulatory Referral for Lung Cancer Screening  10. History of alcohol abuse -He quit drinking in the spring with his hospitalization and denies having any relapse or cravings  This visit occurred during the SARS-CoV-2 public health emergency.  Safety protocols were in place, including screening questions prior to the visit, additional usage of staff PPE, and extensive cleaning of exam room while observing appropriate contact time as indicated for disinfecting solutions.      Olean Ree, FNP-BC  [facility], [facility]  11/09/2020 1:47 PM

## 2020-11-13 ENCOUNTER — Telehealth: Payer: Self-pay

## 2020-11-13 DIAGNOSIS — Z87891 Personal history of nicotine dependence: Secondary | ICD-10-CM

## 2020-11-13 DIAGNOSIS — Z122 Encounter for screening for malignant neoplasm of respiratory organs: Secondary | ICD-10-CM

## 2020-11-13 NOTE — Telephone Encounter (Signed)
Contacted patient for lung CT screening clinic based on referral from Olean Ree, NP.  Patient is agreeable to program and needs a CT scan appt at 3:30 or after due to his work schedule.  I will ask Glenna Fellows, lung navigator to call imaging center and schedule a late afternoon appt.  Patient confirms he has [employer].  Patient is a former smoker who stopped smoking 7 years ago.  He started smoking at age 64 and smoked 100% of 2 packs per day.  He knows to expect a call from Glenna Fellows with CT date and time and address to imaging center.

## 2020-11-14 NOTE — Telephone Encounter (Signed)
Former smoker, quit 2014, 100 pack year history.

## 2020-11-14 NOTE — Addendum Note (Signed)
Addended by: Jonne Ply on: 11/14/2020 10:29 AM   Modules accepted: Orders

## 2020-11-20 ENCOUNTER — Encounter: Payer: Self-pay | Admitting: Nurse Practitioner

## 2020-11-20 ENCOUNTER — Telehealth: Payer: Self-pay

## 2020-11-20 ENCOUNTER — Inpatient Hospital Stay: Payer: 59 | Admitting: Nurse Practitioner

## 2020-11-20 ENCOUNTER — Telehealth: Payer: 59 | Admitting: Nurse Practitioner

## 2020-11-20 ENCOUNTER — Other Ambulatory Visit: Payer: Self-pay

## 2020-11-20 ENCOUNTER — Ambulatory Visit
Admission: RE | Admit: 2020-11-20 | Discharge: 2020-11-20 | Disposition: A | Discharge status: Home / Self Care | Payer: 59 | Source: Ambulatory Visit | Attending: Nurse Practitioner | Admitting: Nurse Practitioner

## 2020-11-20 DIAGNOSIS — Z87891 Personal history of nicotine dependence: Secondary | ICD-10-CM | POA: Diagnosis present

## 2020-11-20 DIAGNOSIS — Z122 Encounter for screening for malignant neoplasm of respiratory organs: Secondary | ICD-10-CM | POA: Insufficient documentation

## 2020-11-20 NOTE — Telephone Encounter (Signed)
Received fax from PCP reporting cologuard received order but did not receive pt insurance info. They requested insurance info be faxed to [phone]. Printed order and insurance cards and faxed.  °

## 2020-11-28 ENCOUNTER — Encounter: Payer: Self-pay | Admitting: *Deleted

## 2020-12-01 ENCOUNTER — Other Ambulatory Visit: Payer: Self-pay | Admitting: Family Medicine

## 2020-12-01 DIAGNOSIS — F419 Anxiety disorder, unspecified: Secondary | ICD-10-CM

## 2020-12-06 ENCOUNTER — Other Ambulatory Visit (HOSPITAL_COMMUNITY): Payer: Self-pay | Admitting: Cardiology

## 2020-12-06 MED FILL — ENTRESTO 49 MG-51 MG TABLET: 49-51 | 30 days supply | Qty: 60 | Fill #0

## 2020-12-06 MED FILL — ELIQUIS 5 MG TABLET: 5 | 30 days supply | Qty: 60 | Fill #0

## 2020-12-10 ENCOUNTER — Encounter: Payer: Self-pay | Admitting: Family Medicine

## 2020-12-19 LAB — COLOGUARD: Cologuard: NEGATIVE

## 2020-12-23 ENCOUNTER — Other Ambulatory Visit (HOSPITAL_COMMUNITY): Payer: Self-pay | Admitting: Cardiology

## 2020-12-24 LAB — COLOGUARD: Cologuard: NEGATIVE

## 2020-12-26 ENCOUNTER — Ambulatory Visit (HOSPITAL_COMMUNITY)
Admission: RE | Admit: 2020-12-26 | Discharge: 2020-12-26 | Disposition: A | Discharge status: Home / Self Care | Payer: 59 | Source: Ambulatory Visit | Attending: Cardiology | Admitting: Cardiology

## 2020-12-26 ENCOUNTER — Other Ambulatory Visit: Payer: Self-pay

## 2020-12-26 DIAGNOSIS — E785 Hyperlipidemia, unspecified: Secondary | ICD-10-CM | POA: Insufficient documentation

## 2020-12-26 LAB — LIPID PANEL
Cholesterol: 138 mg/dL (ref 0–200)
HDL: 52 mg/dL (ref >40)
LDL Cholesterol: 48 mg/dL (ref 0–99)
Total CHOL/HDL Ratio: 2.7 RATIO
Triglycerides: 188 mg/dL — ABNORMAL HIGH (ref <150)
VLDL: 38 mg/dL (ref 0–40)

## 2020-12-26 LAB — HEPATIC FUNCTION PANEL
ALT: 54 U/L — ABNORMAL HIGH (ref 0–44)
AST: 48 U/L — ABNORMAL HIGH (ref 15–41)
Albumin: 4.3 g/dL (ref 3.5–5.0)
Alkaline Phosphatase: 46 U/L (ref 38–126)
Bilirubin, Direct: 0.1 mg/dL (ref 0.0–0.2)
Indirect Bilirubin: 0.5 mg/dL (ref 0.3–0.9)
Total Bilirubin: 0.6 mg/dL (ref 0.3–1.2)
Total Protein: 6.9 g/dL (ref 6.5–8.1)

## 2021-01-02 ENCOUNTER — Telehealth (HOSPITAL_COMMUNITY): Payer: Self-pay

## 2021-01-02 DIAGNOSIS — I5022 Chronic systolic (congestive) heart failure: Secondary | ICD-10-CM

## 2021-01-02 NOTE — Telephone Encounter (Signed)
-----   Message from Laurey Morale, MD sent at 12/26/2020 10:56 PM EST ----- Good lipids.  LFTs mildly elevated, repeat LFTs in 3 wks.

## 2021-01-02 NOTE — Telephone Encounter (Signed)
Samara Snide, RN  01/02/2021 1:21 PM EST Back to Top     Patient advised and verbalized understanding. Lab appt scheduled, lab orders placed   Philicia R Branch, CMA  12/27/2020 3:09 PM EST      lmtrc

## 2021-01-07 MED FILL — ELIQUIS 5 MG TABLET: 5 | 30 days supply | Qty: 60 | Fill #1

## 2021-01-07 MED FILL — ENTRESTO 49 MG-51 MG TABLET: 49-51 | 30 days supply | Qty: 60 | Fill #1

## 2021-01-24 ENCOUNTER — Other Ambulatory Visit (HOSPITAL_COMMUNITY): Payer: 59

## 2021-02-07 MED FILL — ENTRESTO 49 MG-51 MG TABLET: 49-51 | 30 days supply | Qty: 60 | Fill #2

## 2021-02-07 MED FILL — ELIQUIS 5 MG TABLET: 5 | 30 days supply | Qty: 60 | Fill #2

## 2021-02-21 ENCOUNTER — Other Ambulatory Visit (HOSPITAL_COMMUNITY): Payer: 59

## 2021-03-04 ENCOUNTER — Ambulatory Visit (HOSPITAL_COMMUNITY)
Admission: RE | Admit: 2021-03-04 | Discharge: 2021-03-04 | Disposition: A | Discharge status: Home / Self Care | Payer: 59 | Source: Ambulatory Visit | Attending: Internal Medicine | Admitting: Internal Medicine

## 2021-03-04 ENCOUNTER — Other Ambulatory Visit: Payer: Self-pay

## 2021-03-04 DIAGNOSIS — I5022 Chronic systolic (congestive) heart failure: Secondary | ICD-10-CM

## 2021-03-04 LAB — HEPATIC FUNCTION PANEL
ALT: 54 U/L — ABNORMAL HIGH (ref 0–44)
AST: 57 U/L — ABNORMAL HIGH (ref 15–41)
Albumin: 3.3 g/dL — ABNORMAL LOW (ref 3.5–5.0)
Alkaline Phosphatase: 45 U/L (ref 38–126)
Bilirubin, Direct: <0.1 mg/dL (ref 0.0–0.2)
Total Bilirubin: 0.8 mg/dL (ref 0.3–1.2)
Total Protein: 6.7 g/dL (ref 6.5–8.1)

## 2021-03-04 LAB — BASIC METABOLIC PANEL
Anion gap: 10 (ref 5–15)
BUN: 9 mg/dL (ref 8–23)
CO2: 27 mmol/L (ref 22–32)
Calcium: 8.9 mg/dL (ref 8.9–10.3)
Chloride: 102 mmol/L (ref 98–111)
Creatinine, Ser: 1.12 mg/dL (ref 0.61–1.24)
GFR, Estimated: >60 mL/min (ref >60)
Glucose, Bld: 113 mg/dL — ABNORMAL HIGH (ref 70–99)
Potassium: 4.1 mmol/L (ref 3.5–5.1)
Sodium: 139 mmol/L (ref 135–145)

## 2021-03-21 ENCOUNTER — Other Ambulatory Visit (HOSPITAL_BASED_OUTPATIENT_CLINIC_OR_DEPARTMENT_OTHER): Payer: Self-pay

## 2021-04-08 ENCOUNTER — Other Ambulatory Visit (HOSPITAL_COMMUNITY): Payer: Self-pay

## 2021-04-08 ENCOUNTER — Other Ambulatory Visit (HOSPITAL_COMMUNITY): Payer: Self-pay | Admitting: Cardiology

## 2021-04-11 ENCOUNTER — Other Ambulatory Visit (HOSPITAL_COMMUNITY): Payer: Self-pay

## 2021-04-12 ENCOUNTER — Other Ambulatory Visit (HOSPITAL_COMMUNITY): Payer: Self-pay

## 2021-04-12 MED ORDER — ENTRESTO 49-51 MG PO TABS
1.0000 | ORAL_TABLET | Freq: Two times a day (BID) | ORAL | 11 refills | Status: DC
  Filled 2021-04-12: qty 60, 30d supply, fill #0
  Filled 2021-05-09: qty 60, 30d supply, fill #1
  Filled 2021-06-11: qty 60, 30d supply, fill #2
  Filled 2021-07-11: qty 60, 30d supply, fill #3
  Filled 2021-08-12: qty 60, 30d supply, fill #4
  Filled 2021-09-09: qty 60, 30d supply, fill #5
  Filled 2021-10-10: qty 60, 30d supply, fill #6
  Filled 2021-11-14: qty 60, 30d supply, fill #7
  Filled 2021-12-13: qty 60, 30d supply, fill #8
  Filled 2022-01-14: qty 60, 30d supply, fill #9
  Filled 2022-02-12: qty 60, 30d supply, fill #10
  Filled 2022-03-13: qty 60, 30d supply, fill #11

## 2021-04-12 MED ORDER — ELIQUIS 5 MG PO TABS
5.0000 mg | ORAL_TABLET | Freq: Two times a day (BID) | ORAL | 11 refills | Status: DC
Start: 2021-04-12 — End: 2022-04-16
  Filled 2021-04-12: qty 60, 30d supply, fill #0
  Filled 2021-05-09: qty 60, 30d supply, fill #1
  Filled 2021-06-11: qty 60, 30d supply, fill #2
  Filled 2021-08-12: qty 60, 30d supply, fill #3
  Filled 2021-09-09: qty 60, 30d supply, fill #4
  Filled 2021-10-10: qty 60, 30d supply, fill #5
  Filled 2021-11-14: qty 60, 30d supply, fill #6
  Filled 2021-12-13 (×2): qty 60, 30d supply, fill #7
  Filled 2022-01-14: qty 60, 30d supply, fill #8
  Filled 2022-02-12: qty 60, 30d supply, fill #9
  Filled 2022-03-13: qty 60, 30d supply, fill #10

## 2021-04-15 ENCOUNTER — Other Ambulatory Visit (HOSPITAL_COMMUNITY): Payer: Self-pay

## 2021-05-09 ENCOUNTER — Other Ambulatory Visit (HOSPITAL_COMMUNITY): Payer: Self-pay

## 2021-06-11 ENCOUNTER — Other Ambulatory Visit (HOSPITAL_COMMUNITY): Payer: Self-pay

## 2021-06-22 ENCOUNTER — Other Ambulatory Visit (HOSPITAL_COMMUNITY): Payer: Self-pay | Admitting: Cardiology

## 2021-07-11 ENCOUNTER — Other Ambulatory Visit (HOSPITAL_COMMUNITY): Payer: Self-pay

## 2021-08-12 ENCOUNTER — Other Ambulatory Visit (HOSPITAL_COMMUNITY): Payer: Self-pay

## 2021-08-27 ENCOUNTER — Other Ambulatory Visit (HOSPITAL_COMMUNITY): Payer: Self-pay

## 2021-08-28 ENCOUNTER — Other Ambulatory Visit (HOSPITAL_COMMUNITY): Payer: Self-pay

## 2021-08-30 ENCOUNTER — Other Ambulatory Visit (HOSPITAL_COMMUNITY): Payer: Self-pay

## 2021-09-09 ENCOUNTER — Other Ambulatory Visit (HOSPITAL_COMMUNITY): Payer: Self-pay

## 2021-09-23 ENCOUNTER — Other Ambulatory Visit (HOSPITAL_COMMUNITY): Payer: Self-pay | Admitting: Cardiology

## 2021-10-04 ENCOUNTER — Other Ambulatory Visit (HOSPITAL_COMMUNITY): Payer: Self-pay | Admitting: Cardiology

## 2021-10-10 ENCOUNTER — Other Ambulatory Visit (HOSPITAL_COMMUNITY): Payer: Self-pay

## 2021-11-14 ENCOUNTER — Other Ambulatory Visit (HOSPITAL_COMMUNITY): Payer: Self-pay

## 2021-11-25 ENCOUNTER — Other Ambulatory Visit (HOSPITAL_COMMUNITY): Payer: Self-pay | Admitting: Cardiology

## 2021-12-13 ENCOUNTER — Telehealth (HOSPITAL_COMMUNITY): Payer: Self-pay | Admitting: Pharmacy Technician

## 2021-12-13 ENCOUNTER — Other Ambulatory Visit (HOSPITAL_COMMUNITY): Payer: Self-pay

## 2021-12-13 NOTE — Telephone Encounter (Signed)
Advanced Heart Failure Patient Advocate Encounter  Patient called in stating that [facility] advised him that he needed an Eliquis co-pay card. It looks like they had one on file, when I reviewed it the rejection stated that member was not found. I tried to activate another copay card for him and it stopped me. Stated patient needed to call [phone] to speak with a representative. I advised the patient to call and get the billing information and ask if the cards limit had been reached.   Advised the patient to call the office back for samples if this did not work.  Archer Asa, CPhT

## 2021-12-15 ENCOUNTER — Other Ambulatory Visit (HOSPITAL_COMMUNITY): Payer: Self-pay | Admitting: Cardiology

## 2021-12-18 ENCOUNTER — Other Ambulatory Visit (HOSPITAL_COMMUNITY): Payer: Self-pay | Admitting: Cardiology

## 2022-01-09 ENCOUNTER — Other Ambulatory Visit (HOSPITAL_COMMUNITY): Payer: Self-pay | Admitting: Cardiology

## 2022-01-13 ENCOUNTER — Other Ambulatory Visit: Payer: Self-pay

## 2022-01-13 ENCOUNTER — Ambulatory Visit (HOSPITAL_COMMUNITY)
Admission: RE | Admit: 2022-01-13 | Discharge: 2022-01-13 | Disposition: A | Discharge status: Home / Self Care | Payer: 59 | Source: Ambulatory Visit | Attending: Cardiology | Admitting: Cardiology

## 2022-01-13 ENCOUNTER — Encounter (HOSPITAL_COMMUNITY): Payer: Self-pay | Admitting: Cardiology

## 2022-01-13 VITALS — BP 118/60 | HR 66 | Wt 173.4 lb

## 2022-01-13 DIAGNOSIS — I5022 Chronic systolic (congestive) heart failure: Secondary | ICD-10-CM | POA: Diagnosis present

## 2022-01-13 DIAGNOSIS — Z87891 Personal history of nicotine dependence: Secondary | ICD-10-CM | POA: Insufficient documentation

## 2022-01-13 DIAGNOSIS — E785 Hyperlipidemia, unspecified: Secondary | ICD-10-CM | POA: Insufficient documentation

## 2022-01-13 DIAGNOSIS — I480 Paroxysmal atrial fibrillation: Secondary | ICD-10-CM | POA: Diagnosis not present

## 2022-01-13 DIAGNOSIS — R0602 Shortness of breath: Secondary | ICD-10-CM | POA: Diagnosis not present

## 2022-01-13 DIAGNOSIS — I428 Other cardiomyopathies: Secondary | ICD-10-CM | POA: Diagnosis not present

## 2022-01-13 DIAGNOSIS — Z79899 Other long term (current) drug therapy: Secondary | ICD-10-CM | POA: Insufficient documentation

## 2022-01-13 DIAGNOSIS — Z7901 Long term (current) use of anticoagulants: Secondary | ICD-10-CM | POA: Diagnosis not present

## 2022-01-13 LAB — CBC
HCT: 38 % — ABNORMAL LOW (ref 39.0–52.0)
Hemoglobin: 12.7 g/dL — ABNORMAL LOW (ref 13.0–17.0)
MCH: 32.2 pg (ref 26.0–34.0)
MCHC: 33.4 g/dL (ref 30.0–36.0)
MCV: 96.4 fL (ref 80.0–100.0)
Platelets: 249 10*3/uL (ref 150–400)
RBC: 3.94 MIL/uL — ABNORMAL LOW (ref 4.22–5.81)
RDW: 12.9 % (ref 11.5–15.5)
WBC: 6.9 10*3/uL (ref 4.0–10.5)
nRBC: 0 % (ref 0.0–0.2)

## 2022-01-13 LAB — LIPID PANEL
Cholesterol: 126 mg/dL (ref 0–200)
HDL: 47 mg/dL (ref >40)
LDL Cholesterol: 44 mg/dL (ref 0–99)
Total CHOL/HDL Ratio: 2.7 RATIO
Triglycerides: 173 mg/dL — ABNORMAL HIGH (ref <150)
VLDL: 35 mg/dL (ref 0–40)

## 2022-01-13 LAB — BASIC METABOLIC PANEL
Anion gap: 8 (ref 5–15)
BUN: 16 mg/dL (ref 8–23)
CO2: 25 mmol/L (ref 22–32)
Calcium: 9.4 mg/dL (ref 8.9–10.3)
Chloride: 105 mmol/L (ref 98–111)
Creatinine, Ser: 1.26 mg/dL — ABNORMAL HIGH (ref 0.61–1.24)
GFR, Estimated: >60 mL/min (ref >60)
Glucose, Bld: 111 mg/dL — ABNORMAL HIGH (ref 70–99)
Potassium: 4.3 mmol/L (ref 3.5–5.1)
Sodium: 138 mmol/L (ref 135–145)

## 2022-01-13 MED ORDER — ROSUVASTATIN CALCIUM 10 MG PO TABS: 10.0000 mg | ORAL_TABLET | Freq: Every day | ORAL | 3 refills | Status: DC

## 2022-01-13 NOTE — Patient Instructions (Addendum)
No medication changes!  Labs today and repeat in 3 months We will only contact you if something comes back abnormal or we need to make some changes. Otherwise no news is good news!  Your physician recommends that you schedule a follow-up appointment in: 1 year. We will call you to arrange this appointment closer to that time.   Please call office at [phone] option [phone] if you have any questions or concerns.   Do the following things EVERYDAY: Weigh yourself in the morning before breakfast. Write it down and keep it in a log. Take your medicines as prescribed Eat low salt foods--Limit salt (sodium) to 2000 mg per day.  Stay as active as you can everyday Limit all fluids for the day to less than 2 liters  At the [facility], you and your health needs are our priority. As part of our continuing mission to provide you with exceptional heart care, we have created designated Provider Care Teams. These Care Teams include your primary Cardiologist (physician) and Advanced Practice Providers (APPs- Physician Assistants and Nurse Practitioners) who all work together to provide you with the care you need, when you need it.   You may see any of the following providers on your designated Care Team at your next follow up: Dr Arvilla Meres Dr Carron Curie, NP Robbie Lis, [place] [facility], [place] Karle Plumber, PharmD   Please be sure to bring in all your medications bottles to every appointment.

## 2022-01-13 NOTE — Progress Notes (Signed)
Patient ID: Caleb Barnes, male   DOB: Nov 30, 1956, 66 y.o.   MRN: [id number]    Advanced Heart Failure Clinic Note   HF Cardiology: Dr. Shirlee Latch   HPI:  Caleb Barnes is a 66 y.o. male with h/o tobacco abuse and ETOH abuse who presented to [facility] ED on 03/31/16 with SOB. BNP and LFTs were elevated. Was found to be in atrial fibrillation with RVR. Echo 03/31/16 with reduced EF to 20-25%, mild MR, mild/mod reduced RV systolic function, Peak PA systolic 43 mm Hg.  CTA chest with no PE. He was also placed on CIWA protocol for extensive ETOH use as an outpatient. GI consulted with elevated LFTs at [facility]. Abd [place] with gallbladder thickening but normal-appearing liver. He was started on amiodarone drip for atrial fibrillation. Diuresed with IV lasix 40 mg BID.  He was started on Dobutamine and Dopamine drips. Was transferred to [facility] for further evaluation and treatment with worsening condition.   He was weaned off both dopamine and dobutamine in CCU and went back into NSR.  [facility] 04/04/16 showed low CO, mildly elevated filling pressures, and mild nonobstructive CAD. Started on milrinone with low cardiac output. 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  He tolerated milrinone wean and was discharged to home without inotropic support and in NSR.  Repeat echo in 8/17 showed EF up to 55-60%. Echo 8/18 showed EF 50-55%.    Echo in 7/19 showed EF 55-60%.  Echo in 10/21 showed EF 60-65%, normal.   He presents today for followup of CHF.  He is staying off ETOH and cigarettes.  Still working full time in a warehouse.  No chest pain.  No exertional dyspnea.  No palpitations.  No lightheadedness.  Weight is up 3 lbs.    ECG: personally reviewed. NSR, normal.   Labs (4/17): HCT 46.1 Labs (5/17): K 4.3, creatinine 0.98, LFTs normal Labs (7/17): K 4.4, creatinine 1.05 Labs (2/18): K 4.4, creatinine 1.5, LDL 137, hgb 12.7 Labs (6/18): TSH normal Labs (9/18): K 4.1,  creatinine 1.09 Labs (1/19): K 3.6, creatinine 1.05, hgb 13.5.  Labs (7/19): LDL 136, K 4, creatinine 1.22 Labs (3/22): K 4.1, creatinine 1.2  PMH 1. Chronic systolic CHF: Nonischemic cardiomyopathy.  Possible etiologies include tachy-mediated CMP with atrial fib/RVR of unknown duration and ETOH CMP.  - R/LHC (4/17): Mild nonobstructive CAD; mean RA 7, PA 43/25, mean PCWP 21, CI 1.41 Fick and 1.78 thermodilution.  - Echo (4/17): EF < 20%, moderate LV dilation, mid to moderately decreased RV systolic function.  - Echo (8/17): EF 55-60%, moderate LVH, normal RV size and systolic function.  - Echo (8/18): EF 50-55%, PASP 28 mmHg - Echo (7/19): EF 55-60%, normal.  - Echo (7/21): EF 60-65%, normal 2. Atrial fibrillation: Paroxysmal. He is on amiodarone.  3. ETOH abuse: Has quit.  4. COPD  Current Outpatient Medications  Medication Sig Dispense Refill   apixaban (ELIQUIS) 5 MG TABS tablet Take 1 tablet (5 mg total) by mouth 2 (two) times daily. 60 tablet 11   escitalopram (LEXAPRO) 10 MG tablet Take 1 tablet (10 mg total) by mouth daily. 30 tablet 2   metoprolol succinate (TOPROL-XL) 50 MG 24 hr tablet Take 1 tablet (50 mg total) by mouth daily. 30 tablet 6   sacubitril-valsartan (ENTRESTO) 49-51 MG Take 1 tablet by mouth 2 (two) times daily. 60 tablet 11   spironolactone (ALDACTONE) 25 MG tablet Take 0.5 tablets (12.5 mg  total) by mouth daily. Follow up appointment needed for further refills. Please call [phone] to schedule an appointment. 45 tablet 1   rosuvastatin (CRESTOR) 10 MG tablet Take 1 tablet (10 mg total) by mouth daily. Pleas have additional refills managed by pcp 90 tablet 3   No current facility-administered medications for this encounter.    No Known Allergies    Social History   Socioeconomic History   Marital status: Divorced    Spouse name: Not on file   Number of children: Not on file   Years of education: Not on file   Highest education level: Not on file   Occupational History   Not on file  Tobacco Use   Smoking status: Former    Packs/day: 2.00    Years: 50.00    Pack years: 100.00    Types: Cigarettes    Quit date: 2014    Years since quitting: 9.0   Smokeless tobacco: Never  Substance and Sexual Activity   Alcohol use: No   Drug use: No   Sexual activity: Not on file  Other Topics Concern   Not on file  Social History Narrative   Not on file   Social Determinants of Health   Financial Resource Strain: Not on file  Food Insecurity: Not on file  Transportation Needs: Not on file  Physical Activity: Not on file  Stress: Not on file  Social Connections: Not on file  Intimate Partner Violence: Not on file      Family History  Problem Relation Age of Onset   CAD Father    Depression Father    COPD Mother    Hypertension Mother    Review of systems complete and found to be negative unless listed in HPI.   Vitals:   01/13/22 0946  BP: 118/60  Pulse: 66  SpO2: 98%  Weight: 78.7 kg (173 lb 6.4 oz)   Wt Readings from Last 3 Encounters:  01/13/22 78.7 kg (173 lb 6.4 oz)  11/20/20 77.6 kg (171 lb)  11/09/20 77.6 kg (171 lb)     PHYSICAL EXAM: General: NAD Neck: No JVD, no thyromegaly or thyroid nodule.  Lungs: Clear to auscultation bilaterally with normal respiratory effort. CV: Nondisplaced PMI.  Heart regular S1/S2, no S3/S4, no murmur.  No peripheral edema.  No carotid bruit.  Normal pedal pulses.  Abdomen: Soft, nontender, no hepatosplenomegaly, no distention.  Skin: Intact without lesions or rashes.  Neurologic: Alert and oriented x 3.  Psych: Normal affect. Extremities: No clubbing or cyanosis.  HEENT: Normal.   ASSESSMENT & PLAN:  1. Chronic systolic CHF: Nonischemic cardiomyopathy.  I suspect that this was a combination of tachy-mediated CMP + ETOH CMP.  He has quit drinking and he has remained in NSR.  Echo in 8/18 showed EF 50-55%.  Echo in 7/19 and again in 10/21 showed EF 55-60%.  Currently NYHA  class I.  He is not volume overloaded on exam.  - Continue current Toprol XL, Entresto, and spironolactone.  There is no good data that it would be safe for him to stop his meds with improvement in cardiomyopathy. He is tolerating them fine so will continue.  - Check BMET today and will need BMET every 3 months.  2. Atrial fibrillation: Paroxysmal.  EKG today shows NSR - Continue apixaban. Denies bleeding.   - CBC today.  3. Hyperlipidemia: Continue Crestor. - Check lipids today.   Followup in 1 year  Loralie Champagne 01/13/2022

## 2022-01-14 ENCOUNTER — Other Ambulatory Visit (HOSPITAL_COMMUNITY): Payer: Self-pay

## 2022-01-16 ENCOUNTER — Other Ambulatory Visit (HOSPITAL_COMMUNITY): Payer: Self-pay

## 2022-02-12 ENCOUNTER — Other Ambulatory Visit (HOSPITAL_COMMUNITY): Payer: Self-pay

## 2022-02-14 ENCOUNTER — Other Ambulatory Visit (HOSPITAL_COMMUNITY): Payer: Self-pay

## 2022-03-08 ENCOUNTER — Other Ambulatory Visit (HOSPITAL_COMMUNITY): Payer: Self-pay | Admitting: Cardiology

## 2022-03-13 ENCOUNTER — Other Ambulatory Visit (HOSPITAL_COMMUNITY): Payer: Self-pay

## 2022-03-15 ENCOUNTER — Other Ambulatory Visit (HOSPITAL_COMMUNITY): Payer: Self-pay

## 2022-03-17 ENCOUNTER — Other Ambulatory Visit (HOSPITAL_COMMUNITY): Payer: Self-pay

## 2022-03-25 ENCOUNTER — Telehealth: Payer: Self-pay | Admitting: *Deleted

## 2022-03-25 NOTE — Telephone Encounter (Signed)
LMTC to schedule Yearly Lung CA CT Scan. 

## 2022-04-14 ENCOUNTER — Ambulatory Visit (HOSPITAL_COMMUNITY)
Admission: RE | Admit: 2022-04-14 | Discharge: 2022-04-14 | Disposition: A | Discharge status: Home / Self Care | Payer: 59 | Source: Ambulatory Visit | Attending: Cardiology | Admitting: Cardiology

## 2022-04-14 DIAGNOSIS — I5022 Chronic systolic (congestive) heart failure: Secondary | ICD-10-CM | POA: Diagnosis present

## 2022-04-14 LAB — BASIC METABOLIC PANEL
Anion gap: 5 (ref 5–15)
BUN: 10 mg/dL (ref 8–23)
CO2: 27 mmol/L (ref 22–32)
Calcium: 9.1 mg/dL (ref 8.9–10.3)
Chloride: 107 mmol/L (ref 98–111)
Creatinine, Ser: 1.19 mg/dL (ref 0.61–1.24)
GFR, Estimated: >60 mL/min (ref >60)
Glucose, Bld: 157 mg/dL — ABNORMAL HIGH (ref 70–99)
Potassium: 4.4 mmol/L (ref 3.5–5.1)
Sodium: 139 mmol/L (ref 135–145)

## 2022-04-16 ENCOUNTER — Other Ambulatory Visit (HOSPITAL_COMMUNITY): Payer: Self-pay | Admitting: Cardiology

## 2022-04-16 ENCOUNTER — Other Ambulatory Visit (HOSPITAL_COMMUNITY): Payer: Self-pay

## 2022-04-16 MED ORDER — APIXABAN 5 MG PO TABS
5.0000 mg | ORAL_TABLET | Freq: Two times a day (BID) | ORAL | 11 refills | Status: DC
  Filled 2022-04-16: qty 60, 30d supply, fill #0
  Filled 2022-05-14: qty 60, 30d supply, fill #1
  Filled 2022-06-12: qty 60, 30d supply, fill #2
  Filled 2022-07-14: qty 60, 30d supply, fill #3
  Filled 2022-08-13: qty 60, 30d supply, fill #4
  Filled 2022-09-29: qty 60, 30d supply, fill #5
  Filled 2022-10-30: qty 60, 30d supply, fill #6
  Filled 2022-11-27: qty 60, 30d supply, fill #7
  Filled 2022-12-26: qty 60, 30d supply, fill #8
  Filled 2023-01-26: qty 60, 30d supply, fill #9
  Filled 2023-02-26 – 2023-03-03 (×3): qty 60, 30d supply, fill #10
  Filled 2023-03-26 – 2023-03-31 (×2): qty 60, 30d supply, fill #11

## 2022-04-16 MED ORDER — ENTRESTO 49-51 MG PO TABS
1.0000 | ORAL_TABLET | Freq: Two times a day (BID) | ORAL | 11 refills | Status: DC
  Filled 2022-04-16: qty 60, 30d supply, fill #0
  Filled 2022-05-14: qty 60, 30d supply, fill #1
  Filled 2022-06-12: qty 60, 30d supply, fill #2
  Filled 2022-07-14: qty 60, 30d supply, fill #3
  Filled 2022-08-13 (×3): qty 60, 30d supply, fill #4

## 2022-05-14 ENCOUNTER — Other Ambulatory Visit (HOSPITAL_COMMUNITY): Payer: Self-pay

## 2022-06-12 ENCOUNTER — Other Ambulatory Visit (HOSPITAL_COMMUNITY): Payer: Self-pay

## 2022-06-18 ENCOUNTER — Other Ambulatory Visit (HOSPITAL_COMMUNITY): Payer: Self-pay | Admitting: Cardiology

## 2022-07-14 ENCOUNTER — Other Ambulatory Visit (HOSPITAL_COMMUNITY): Payer: Self-pay

## 2022-07-16 ENCOUNTER — Other Ambulatory Visit (HOSPITAL_COMMUNITY): Payer: Self-pay

## 2022-08-13 ENCOUNTER — Telehealth (HOSPITAL_COMMUNITY): Payer: Self-pay | Admitting: Pharmacy Technician

## 2022-08-13 ENCOUNTER — Other Ambulatory Visit (HOSPITAL_COMMUNITY): Payer: Self-pay

## 2022-08-13 NOTE — Telephone Encounter (Signed)
Advanced Heart Failure Patient Advocate Encounter  Patient called in, his Entresto co-pay card has reached its annual limit. Sent [employer] application via [employer]. The patient is aware that POI for the household will be needed to get approval.  Will follow up.

## 2022-08-19 ENCOUNTER — Other Ambulatory Visit (HOSPITAL_COMMUNITY): Payer: Self-pay | Admitting: *Deleted

## 2022-08-19 ENCOUNTER — Other Ambulatory Visit (HOSPITAL_COMMUNITY): Payer: Self-pay

## 2022-08-19 MED ORDER — ENTRESTO 49-51 MG PO TABS: 1.0000 | ORAL_TABLET | Freq: Two times a day (BID) | ORAL | 3 refills | Status: DC

## 2022-08-19 NOTE — Telephone Encounter (Signed)
Medication Samples have been provided to the patient.  Drug name: Sherryll Burger       Strength: 49-51mg         Qty: 2 bottles  LOT: [id number]  Exp.Date: 06/2024  Dosing instructions: Take 1 tablet by mouth twice daily.   The patient has been instructed regarding the correct time, dose, and frequency of taking this medication, including desired effects and most common side effects.   Caleb Barnes Caleb Barnes 10:30 AM 08/19/2022

## 2022-08-21 NOTE — Telephone Encounter (Signed)
Advanced Heart Failure Patient Advocate Encounter  Sent in [employer] application via fax. Document scanned to chart. Still need POI.   Will follow up.

## 2022-08-26 ENCOUNTER — Other Ambulatory Visit (HOSPITAL_COMMUNITY): Payer: Self-pay | Admitting: Cardiology

## 2022-08-26 ENCOUNTER — Other Ambulatory Visit (HOSPITAL_COMMUNITY): Payer: Self-pay

## 2022-08-29 ENCOUNTER — Other Ambulatory Visit (HOSPITAL_COMMUNITY): Payer: Self-pay | Admitting: *Deleted

## 2022-08-29 ENCOUNTER — Other Ambulatory Visit (HOSPITAL_COMMUNITY): Payer: Self-pay

## 2022-08-29 MED ORDER — ENTRESTO 49-51 MG PO TABS: 1.0000 | ORAL_TABLET | Freq: Two times a day (BID) | ORAL | 3 refills | Status: DC

## 2022-09-04 ENCOUNTER — Other Ambulatory Visit (HOSPITAL_COMMUNITY): Payer: Self-pay

## 2022-09-05 ENCOUNTER — Other Ambulatory Visit (HOSPITAL_COMMUNITY): Payer: Self-pay

## 2022-09-05 NOTE — Telephone Encounter (Signed)
Advanced Heart Failure Patient Advocate Encounter  Contacted patient regarding POI submission to [employer]. Patient has already faxed POI forms to [employer] and is waiting for an update.  Caleb Barnes, CPhT Rx Patient Advocate Phone: [phone]

## 2022-09-08 NOTE — Telephone Encounter (Signed)
Advanced Heart Failure Patient Advocate Encounter   Patient was approved to receive Entresto from [employer]  Effective dates: 09/08/22 through 12/28/22  Document scanned to chart.   Archer Asa, CPhT

## 2022-09-22 ENCOUNTER — Telehealth (HOSPITAL_COMMUNITY): Payer: Self-pay | Admitting: Pharmacy Technician

## 2022-09-22 NOTE — Telephone Encounter (Signed)
Advanced Heart Failure Patient Advocate Encounter  Patient called in stating he has not been able to get a refill of the Entresto from [employer] yet. Called and spoke with [employer]. They did not provide the approval dates to the pharmacy. Now that the approval dates have been provided, the pharmacy will contact the patient within 48 hours to schedule a refill.  Called and updated the patient. Advised him to call back if he had further issues.   Charlann Boxer, CPhT

## 2022-09-29 ENCOUNTER — Other Ambulatory Visit (HOSPITAL_COMMUNITY): Payer: Self-pay

## 2022-10-30 ENCOUNTER — Other Ambulatory Visit (HOSPITAL_COMMUNITY): Payer: Self-pay

## 2022-11-07 ENCOUNTER — Other Ambulatory Visit (HOSPITAL_COMMUNITY): Payer: Self-pay

## 2022-11-13 ENCOUNTER — Telehealth (HOSPITAL_COMMUNITY): Payer: Self-pay

## 2022-11-13 NOTE — Telephone Encounter (Signed)
Advanced Heart Failure Patient Advocate Encounter  Application for Entresto (renewal) faxed to [employer] on 11/13/2022. Insurance information and POI were sent with application. Forms attached to patient chart.  Burnell Blanks, CPhT Rx Patient Advocate Phone: [phone]

## 2022-11-19 NOTE — Telephone Encounter (Signed)
Advanced Heart Failure Patient Advocate Encounter  Patient was renewed to continue to receive Entresto from [employer] Effective 11/19/2022 to 12/29/2023 Determination letter has been added to patient chart.  Burnell Blanks, CPhT Rx Patient Advocate Phone: [phone]

## 2022-11-21 ENCOUNTER — Emergency Department
Admission: EM | Admit: 2022-11-21 | Discharge: 2022-11-22 | Disposition: A | Discharge status: Home / Self Care | Payer: 59 | Attending: Emergency Medicine | Admitting: Emergency Medicine

## 2022-11-21 ENCOUNTER — Emergency Department: Payer: 59 | Admitting: Certified Registered"

## 2022-11-21 ENCOUNTER — Emergency Department: Payer: 59

## 2022-11-21 ENCOUNTER — Encounter
Admission: EM | Disposition: A | Discharge status: Home / Self Care | Payer: Self-pay | Source: Home / Self Care | Attending: Emergency Medicine

## 2022-11-21 DIAGNOSIS — J449 Chronic obstructive pulmonary disease, unspecified: Secondary | ICD-10-CM | POA: Diagnosis not present

## 2022-11-21 DIAGNOSIS — Z7901 Long term (current) use of anticoagulants: Secondary | ICD-10-CM | POA: Diagnosis not present

## 2022-11-21 DIAGNOSIS — K222 Esophageal obstruction: Secondary | ICD-10-CM | POA: Insufficient documentation

## 2022-11-21 DIAGNOSIS — W44F3XA Food entering into or through a natural orifice, initial encounter: Secondary | ICD-10-CM

## 2022-11-21 DIAGNOSIS — T18128A Food in esophagus causing other injury, initial encounter: Secondary | ICD-10-CM | POA: Insufficient documentation

## 2022-11-21 DIAGNOSIS — I509 Heart failure, unspecified: Secondary | ICD-10-CM | POA: Insufficient documentation

## 2022-11-21 DIAGNOSIS — R1310 Dysphagia, unspecified: Secondary | ICD-10-CM | POA: Diagnosis present

## 2022-11-21 DIAGNOSIS — F419 Anxiety disorder, unspecified: Secondary | ICD-10-CM | POA: Insufficient documentation

## 2022-11-21 DIAGNOSIS — Z87891 Personal history of nicotine dependence: Secondary | ICD-10-CM | POA: Diagnosis not present

## 2022-11-21 DIAGNOSIS — E785 Hyperlipidemia, unspecified: Secondary | ICD-10-CM | POA: Diagnosis not present

## 2022-11-21 DIAGNOSIS — N179 Acute kidney failure, unspecified: Secondary | ICD-10-CM | POA: Diagnosis not present

## 2022-11-21 DIAGNOSIS — I4891 Unspecified atrial fibrillation: Secondary | ICD-10-CM | POA: Insufficient documentation

## 2022-11-21 DIAGNOSIS — Z91199 Patient's noncompliance with other medical treatment and regimen due to unspecified reason: Secondary | ICD-10-CM

## 2022-11-21 HISTORY — PX: ESOPHAGOGASTRODUODENOSCOPY: SHX5428

## 2022-11-21 LAB — BASIC METABOLIC PANEL
Anion gap: 13 (ref 5–15)
BUN: 27 mg/dL — ABNORMAL HIGH (ref 8–23)
CO2: 23 mmol/L (ref 22–32)
Calcium: 10.1 mg/dL (ref 8.9–10.3)
Chloride: 106 mmol/L (ref 98–111)
Creatinine, Ser: 1.44 mg/dL — ABNORMAL HIGH (ref 0.61–1.24)
GFR, Estimated: 54 mL/min — ABNORMAL LOW (ref >60)
Glucose, Bld: 101 mg/dL — ABNORMAL HIGH (ref 70–99)
Potassium: 4.2 mmol/L (ref 3.5–5.1)
Sodium: 142 mmol/L (ref 135–145)

## 2022-11-21 LAB — CBC
HCT: 44.2 % (ref 39.0–52.0)
Hemoglobin: 15.6 g/dL (ref 13.0–17.0)
MCH: 33.2 pg (ref 26.0–34.0)
MCHC: 35.3 g/dL (ref 30.0–36.0)
MCV: 94 fL (ref 80.0–100.0)
Platelets: 289 10*3/uL (ref 150–400)
RBC: 4.7 MIL/uL (ref 4.22–5.81)
RDW: 12.6 % (ref 11.5–15.5)
WBC: 13.8 10*3/uL — ABNORMAL HIGH (ref 4.0–10.5)
nRBC: 0 % (ref 0.0–0.2)

## 2022-11-21 SURGERY — EGD (ESOPHAGOGASTRODUODENOSCOPY)
Anesthesia: General

## 2022-11-21 MED ORDER — SODIUM CHLORIDE 0.9 % IV SOLN
INTRAVENOUS | Status: DC
  Administered 2022-11-21: 10 mL/h via INTRAVENOUS

## 2022-11-21 MED ORDER — FENTANYL CITRATE (PF) 100 MCG/2ML IJ SOLN
INTRAMUSCULAR | Status: AC
  Filled 2022-11-21: qty 2

## 2022-11-21 MED ORDER — GLUCAGON HCL RDNA (DIAGNOSTIC) 1 MG IJ SOLR
1.0000 mg | Freq: Once | INTRAMUSCULAR | Status: AC
Start: 2022-11-21 — End: 2022-11-21
  Administered 2022-11-21: 1 mg via INTRAVENOUS
  Filled 2022-11-21: qty 1

## 2022-11-21 MED ORDER — PROPOFOL 10 MG/ML IV BOLUS
INTRAVENOUS | Status: AC
  Filled 2022-11-21: qty 20

## 2022-11-21 NOTE — ED Triage Notes (Signed)
First Nurse Note: Pt reports difficulty swallowing since yesterday, seen at [facility] sent over for evaluation of ? Esophageal stricture. Pt denies SOB or CP at current. Unable to keep water down or food.

## 2022-11-21 NOTE — H&P (Signed)
Wyline Mood, MD [address], [place], [place], [address] [address], [address], [place], [place], [address] Phone: [phone]  Fax: [phone]  Primary Care Physician:  Patient, No Pcp Per   Pre-Procedure History & Physical: HPI:  Caleb Barnes is a 66 y.o. male is here for an endoscopy    Past Medical History:  Diagnosis Date   Acute renal failure (HCC)    Anxiety    CHF (congestive heart failure) (HCC)    COPD (chronic obstructive pulmonary disease) (HCC)    Heart disease    Hyperlipidemia    Polysubstance abuse (HCC)    Shortness of breath dyspnea     Past Surgical History:  Procedure Laterality Date   CARDIAC CATHETERIZATION N/A 04/04/2016   Procedure: Right/Left Heart Cath and Coronary Angiography;  Surgeon: Laurey Morale, MD;  Location: [facility] INVASIVE CV LAB;  Service: Cardiovascular;  Laterality: N/A;    Prior to Admission medications   Medication Sig Start Date End Date Taking? Authorizing Provider  apixaban (ELIQUIS) 5 MG TABS tablet Take 1 tablet (5 mg total) by mouth 2 (two) times daily. 04/16/22  Yes Laurey Morale, MD  escitalopram (LEXAPRO) 10 MG tablet Take 1 tablet (10 mg total) by mouth daily. 11/09/20  Yes Emi Belfast, FNP  metoprolol succinate (TOPROL-XL) 50 MG 24 hr tablet TAKE 1 TABLET BY MOUTH EVERY DAY 06/18/22  Yes Laurey Morale, MD  rosuvastatin (CRESTOR) 10 MG tablet Take 1 tablet (10 mg total) by mouth daily. Pleas have additional refills managed by pcp 01/13/22  Yes Laurey Morale, MD  sacubitril-valsartan (ENTRESTO) 49-51 MG Take 1 tablet by mouth 2 (two) times daily. 08/29/22  Yes Laurey Morale, MD  spironolactone (ALDACTONE) 25 MG tablet Take 0.5 tablets (12.5 mg total) by mouth daily. 03/10/22  Yes Laurey Morale, MD    Allergies as of 11/21/2022   (No Known Allergies)    Family History  Problem Relation Age of Onset   CAD Father    Depression Father    COPD Mother    Hypertension Mother     Social  History   Socioeconomic History   Marital status: Divorced    Spouse name: Not on file   Number of children: Not on file   Years of education: Not on file   Highest education level: Not on file  Occupational History   Not on file  Tobacco Use   Smoking status: Former    Packs/day: 2.00    Years: 50.00    Total pack years: 100.00    Types: Cigarettes    Quit date: 2014    Years since quitting: 9.9   Smokeless tobacco: Never  Substance and Sexual Activity   Alcohol use: No   Drug use: No   Sexual activity: Not on file  Other Topics Concern   Not on file  Social History Narrative   Not on file   Social Determinants of Health   Financial Resource Strain: Not on file  Food Insecurity: Not on file  Transportation Needs: Not on file  Physical Activity: Not on file  Stress: Not on file  Social Connections: Not on file  Intimate Partner Violence: Not on file    Review of Systems: See HPI, otherwise negative ROS  Physical Exam: BP 125/74   Pulse 77   Temp (!) 97.3 F (36.3 C) (Temporal)   Resp 20   Ht 5\' 10"  (1.778 m)   Wt 77.1  kg   SpO2 (!) 2%   BMI 24.39 kg/m  General:   Alert,  pleasant and cooperative in NAD Head:  Normocephalic and atraumatic. Neck:  Supple; no masses or thyromegaly. Lungs:  Clear throughout to auscultation, normal respiratory effort.    Heart:  +S1, +S2, Regular rate and rhythm, No edema. Abdomen:  Soft, nontender and nondistended. Normal bowel sounds, without guarding, and without rebound.   Neurologic:  Alert and  oriented x4;  grossly normal neurologically.  Impression/Plan: Caleb Barnes is here for an endoscopy  to be performed for  evaluation of dysphagia    Risks, benefits, limitations, and alternatives regarding endoscopy have been reviewed with the patient.  Questions have been answered.  All parties agreeable.   Wyline Mood, MD  11/21/2022, 11:55 PM

## 2022-11-21 NOTE — Consult Note (Signed)
Wyline Mood , MD [address], [place], [place], [address] [address], [address], [place], [place], [address] Phone: [phone]  Fax: [phone]  Consultation  Referring Provider:     ER Primary Care Physician:  Patient, No Pcp Per Primary Gastroenterologist:  None          Reason for Consultation:     Inability to swallow  Date of Admission:  11/21/2022 Date of Consultation:  11/21/2022         HPI:   Caleb Barnes is a 66 y.o. male presented to the emergency room about 2 hours back.  History of hyperlipidemia, CHF, COPD, alcohol abuse on Eliquis for atrial fibrillation.  Last dose of Eliquis was last night.  He states that he had Thanksgiving dinner and around 9 PM after eating some [place] he felt like it got stuck in the middle of his chest and since then unable to swallow any solids or liquids.  Attempt was made to give him some thing to drink in the ER following a glucagon administration but it did not help.  He says that food has got stuck in the past but always passes done this the first time he does not want down.  Chest x-ray in the emergency room shows no abnormalities.  Past Medical History:  Diagnosis Date   Acute renal failure (HCC)    Anxiety    CHF (congestive heart failure) (HCC)    COPD (chronic obstructive pulmonary disease) (HCC)    Heart disease    Hyperlipidemia    Polysubstance abuse (HCC)    Shortness of breath dyspnea     Past Surgical History:  Procedure Laterality Date   CARDIAC CATHETERIZATION N/A 04/04/2016   Procedure: Right/Left Heart Cath and Coronary Angiography;  Surgeon: Laurey Morale, MD;  Location: [facility] INVASIVE CV LAB;  Service: Cardiovascular;  Laterality: N/A;    Prior to Admission medications   Medication Sig Start Date End Date Taking? Authorizing Provider  apixaban (ELIQUIS) 5 MG TABS tablet Take 1 tablet (5 mg total) by mouth 2 (two) times daily. 04/16/22  Yes Laurey Morale, MD  escitalopram (LEXAPRO) 10 MG tablet  Take 1 tablet (10 mg total) by mouth daily. 11/09/20  Yes Emi Belfast, FNP  metoprolol succinate (TOPROL-XL) 50 MG 24 hr tablet TAKE 1 TABLET BY MOUTH EVERY DAY 06/18/22  Yes Laurey Morale, MD  rosuvastatin (CRESTOR) 10 MG tablet Take 1 tablet (10 mg total) by mouth daily. Pleas have additional refills managed by pcp 01/13/22  Yes Laurey Morale, MD  sacubitril-valsartan (ENTRESTO) 49-51 MG Take 1 tablet by mouth 2 (two) times daily. 08/29/22  Yes Laurey Morale, MD  spironolactone (ALDACTONE) 25 MG tablet Take 0.5 tablets (12.5 mg total) by mouth daily. 03/10/22  Yes Laurey Morale, MD    Family History  Problem Relation Age of Onset   CAD Father    Depression Father    COPD Mother    Hypertension Mother      Social History   Tobacco Use   Smoking status: Former    Packs/day: 2.00    Years: 50.00    Total pack years: 100.00    Types: Cigarettes    Quit date: 2014    Years since quitting: 9.9   Smokeless tobacco: Never  Substance Use Topics   Alcohol use: No   Drug use: No    Allergies as of 11/21/2022   (No Known Allergies)  Review of Systems:    All systems reviewed and negative except where noted in HPI.   Physical Exam:  Vital signs in last 24 hours: Temp:  [97.3 F (36.3 C)-98.5 F (36.9 C)] 97.3 F (36.3 C) (11/24 2329) Pulse Rate:  [67-77] 77 (11/24 2329) Resp:  [16-20] 20 (11/24 2329) BP: (125-134)/(74-79) 125/74 (11/24 2329) SpO2:  [2 %-96 %] 2 % (11/24 2329) Weight:  [77.1 kg] 77.1 kg (11/24 2034)   General:   Pleasant, cooperative in NAD Head:  Normocephalic and atraumatic. Eyes:   No icterus.   Conjunctiva pink. PERRLA. Ears:  Normal auditory acuity. Neck:  Supple; no masses or thyroidomegaly Lungs: Respirations even and unlabored. Lungs clear to auscultation bilaterally.   No wheezes, crackles, or rhonchi.  Heart:  Regular rate and rhythm;  Without murmur, clicks, rubs or gallops Abdomen:  Soft, nondistended, nontender. Normal bowel  sounds. No appreciable masses or hepatomegaly.  No rebound or guarding.  Neurologic:  Alert and oriented x3;  grossly normal neurologically. Psych:  Alert and cooperative. Normal affect.  LAB RESULTS: Recent Labs    11/21/22 2044  WBC 13.8*  HGB 15.6  HCT 44.2  PLT 289   BMET Recent Labs    11/21/22 2044  NA 142  K 4.2  CL 106  CO2 23  GLUCOSE 101*  BUN 27*  CREATININE 1.44*  CALCIUM 10.1   LFT No results for input(s): "PROT", "ALBUMIN", "AST", "ALT", "ALKPHOS", "BILITOT", "BILIDIR", "IBILI" in the last 72 hours. PT/INR No results for input(s): "LABPROT", "INR" in the last 72 hours.  STUDIES: DG Chest 2 View  Result Date: 11/21/2022 CLINICAL DATA:  Difficulty swallowing. EXAM: CHEST - 2 VIEW COMPARISON:  April 01, 2016 FINDINGS: The heart size and mediastinal contours are within normal limits. Both lungs are clear. The visualized skeletal structures are unremarkable. IMPRESSION: No active cardiopulmonary disease. Electronically Signed   By: Aram Candela M.D.   On: 11/21/2022 21:05      Impression / Plan:   Caleb Barnes is a 66 y.o. y/o male present to the emergency room with inability to swallow for over 24 hours.  Appears that he has had a piece of [place] lodged in his esophagus after Thanksgiving dinner at 9 PM last night.  I explained to him that the food has been stuck in his esophagus for over 24 hours secondary to the risks of esophageal perforation but I will do my best to take it out.  We will proceed with an upper endoscopy after intubation.    I have discussed alternative options, risks & benefits,  which include, but are not limited to, bleeding, infection, perforation,respiratory complication & drug reaction.  The patient agrees with this plan & written consent will be obtained.     Thank you for involving me in the care of this patient.      LOS: 0 days   Wyline Mood, MD  11/21/2022, 11:53 PM

## 2022-11-21 NOTE — ED Triage Notes (Signed)
Pt reports difficulty swallowing since yesterday after eating, seen at [facility] sent over for evaluation of ? Esophageal stricture. Pt denies SOB or CP at current. Unable to keep water down or food.  Pt feels that is some food stick in his throat, pt able to speak in complete sentences. Pulse ox 96% RA

## 2022-11-21 NOTE — Anesthesia Preprocedure Evaluation (Signed)
Anesthesia Evaluation  Patient identified by MRN, date of birth, ID band Patient awake    Reviewed: Allergy & Precautions, NPO status , Patient's Chart, lab work & pertinent test results  History of Anesthesia Complications Negative for: history of anesthetic complications  Airway Mallampati: III  TM Distance: >3 FB Neck ROM: full    Dental  (+) Dental Advidsory Given, Edentulous Lower, Edentulous Upper   Pulmonary neg sleep apnea, COPD, neg recent URI, former smoker   Pulmonary exam normal        Cardiovascular (-) Past MI, (-) CABG and (-) CHF negative cardio ROS Atrial Fibrillation      Neuro/Psych  PSYCHIATRIC DISORDERS      negative neurological ROS     GI/Hepatic negative GI ROS, Neg liver ROS,,,  Endo/Other  negative endocrine ROS    Renal/GU Renal disease     Musculoskeletal   Abdominal   Peds  Hematology negative hematology ROS (+)   Anesthesia Other Findings Past Medical History: No date: Acute renal failure (HCC) No date: Anxiety No date: CHF (congestive heart failure) (HCC) No date: COPD (chronic obstructive pulmonary disease) (HCC) No date: Heart disease No date: Hyperlipidemia No date: Polysubstance abuse (HCC) No date: Shortness of breath dyspnea  Past Surgical History: 04/04/2016: CARDIAC CATHETERIZATION; N/A     Comment:  Procedure: Right/Left Heart Cath and Coronary               Angiography;  Surgeon: Laurey Morale, MD;  Location: [facility]              INVASIVE CV LAB;  Service: Cardiovascular;  Laterality:               N/A;  BMI    Body Mass Index: 24.39 kg/m      Reproductive/Obstetrics negative OB ROS                             Anesthesia Physical Anesthesia Plan  ASA: 3  Anesthesia Plan: General ETT   Post-op Pain Management: Minimal or no pain anticipated   Induction: Intravenous  PONV Risk Score and Plan: 3 and Ondansetron and  Dexamethasone  Airway Management Planned: Oral ETT  Additional Equipment:   Intra-op Plan:   Post-operative Plan: Extubation in OR  Informed Consent: I have reviewed the patients History and Physical, chart, labs and discussed the procedure including the risks, benefits and alternatives for the proposed anesthesia with the patient or authorized representative who has indicated his/her understanding and acceptance.     Dental Advisory Given  Plan Discussed with: Anesthesiologist, CRNA and Surgeon  Anesthesia Plan Comments: (Patient consented for risks of anesthesia including but not limited to:  - adverse reactions to medications - damage to eyes, teeth, lips or other oral mucosa - nerve damage due to positioning  - sore throat or hoarseness - Damage to heart, brain, nerves, lungs, other parts of body or loss of life  Patient voiced understanding.)       Anesthesia Quick Evaluation

## 2022-11-21 NOTE — ED Notes (Signed)
Pt gone to ENDO.

## 2022-11-21 NOTE — ED Notes (Signed)
Report to Endo. They are to come get the patient.

## 2022-11-21 NOTE — ED Provider Notes (Signed)
[facility] Provider Note    Event Date/Time   First MD Initiated Contact with Patient 11/21/22 2146     (approximate)   History   Chief Complaint Dysphagia   HPI  Caleb Barnes is a 66 y.o. male with past medical history of hyperlipidemia, atrial fibrillation on Eliquis, CHF, COPD, and alcohol abuse who presents to the ED complaining of dysphagia.  Patient reports that he had [place] dinner for Thanksgiving last night around 9 PM, has been dealing with persistent nausea and vomiting since waking up this morning.  He feels like something is stuck around the middle of his chest and anytime he tries to eat liquids or solids, it eventually comes back up.  He denies any associated pain.  He has had similar issues in the past, however it typically resolves on its own after a couple of hours.  He was initially seen at urgent care and given a GI cocktail, subsequently referred to the ED when he vomited this back up.  He feels like he can initially swallow his spit but then it eventually comes back up as well.     Physical Exam   Triage Vital Signs: ED Triage Vitals [11/21/22 2034]  Enc Vitals Group     BP 134/79     Pulse Rate 67     Resp 16     Temp 98.5 F (36.9 C)     Temp Source Oral     SpO2 96 %     Weight 170 lb (77.1 kg)     Height 5\' 10"  (1.778 m)     Head Circumference      Peak Flow      Pain Score 0     Pain Loc      Pain Edu?      Excl. in GC?     Most recent vital signs: Vitals:   11/21/22 2034  BP: 134/79  Pulse: 67  Resp: 16  Temp: 98.5 F (36.9 C)  SpO2: 96%    Constitutional: Alert and oriented. Eyes: Conjunctivae are normal. Head: Atraumatic. Nose: No congestion/rhinnorhea. Mouth/Throat: Mucous membranes are moist.  Cardiovascular: Normal rate, regular rhythm. Grossly normal heart sounds.  2+ radial pulses bilaterally. Respiratory: Normal respiratory effort.  No retractions. Lungs CTAB. Gastrointestinal: Soft and  nontender. No distention. Musculoskeletal: No lower extremity tenderness nor edema.  Neurologic:  Normal speech and language. No gross focal neurologic deficits are appreciated.    ED Results / Procedures / Treatments   Labs (all labs ordered are listed, but only abnormal results are displayed) Labs Reviewed  CBC - Abnormal; Notable for the following components:      Result Value   WBC 13.8 (*)    All other components within normal limits  BASIC METABOLIC PANEL - Abnormal; Notable for the following components:   Glucose, Bld 101 (*)    BUN 27 (*)    Creatinine, Ser 1.44 (*)    GFR, Estimated 54 (*)    All other components within normal limits   RADIOLOGY Chest x-ray reviewed and interpreted by me with no infiltrate, edema, or effusion.  PROCEDURES:  Critical Care performed: No  Procedures   MEDICATIONS ORDERED IN ED: Medications  glucagon (human recombinant) (GLUCAGEN) injection 1 mg (1 mg Intravenous Given 11/21/22 2204)     IMPRESSION / MDM / ASSESSMENT AND PLAN / ED COURSE  I reviewed the triage vital signs and the nursing notes.  66 y.o. male with past medical history of hyperlipidemia, atrial fibrillation on Eliquis, CHF, COPD, and alcohol abuse who presents to the ED complaining of difficulty swallowing and vomiting whenever he does so since 9 PM last night.  Patient's presentation is most consistent with acute presentation with potential threat to life or bodily function.  Differential diagnosis includes, but is not limited to, impacted food bolus, esophageal rupture, globus sensation, GERD.  Patient nontoxic-appearing and in no acute distress, vital signs are unremarkable.  While he is currently able to swallow his oral secretions, he states that these eventually feel like they get stuck and he vomits them back up.  He was unable to tolerate GI cocktail at urgent care, will give dose of IV glucagon here in the ED.  Chest x-ray is  unremarkable, labs show no significant anemia, leukocytosis, or electrolyte abnormality, patient does have mild AKI.  Patient without change in symptoms following IV glucagon, he was given small amount of water to try but has now vomited this back up.  Case discussed with Dr. Vicente Males of GI, who will arrange for endoscopy later this evening.      FINAL CLINICAL IMPRESSION(S) / ED DIAGNOSES   Final diagnoses:  Esophageal obstruction due to food impaction     Rx / DC Orders   ED Discharge Orders     None        Note:  This document was prepared using Dragon voice recognition software and may include unintentional dictation errors.   Blake Divine, MD 11/21/22 2258

## 2022-11-22 MED ORDER — LIDOCAINE HCL (CARDIAC) PF 100 MG/5ML IV SOSY
PREFILLED_SYRINGE | INTRAVENOUS | Status: DC | PRN
  Administered 2022-11-22: 80 mg via INTRAVENOUS

## 2022-11-22 MED ORDER — FENTANYL CITRATE (PF) 100 MCG/2ML IJ SOLN
INTRAMUSCULAR | Status: DC | PRN
  Administered 2022-11-22: 50 ug via INTRAVENOUS

## 2022-11-22 MED ORDER — SUCCINYLCHOLINE CHLORIDE 200 MG/10ML IV SOSY
PREFILLED_SYRINGE | INTRAVENOUS | Status: DC | PRN
  Administered 2022-11-22: 100 mg via INTRAVENOUS

## 2022-11-22 MED ORDER — ONDANSETRON HCL 4 MG/2ML IJ SOLN
INTRAMUSCULAR | Status: DC | PRN
Start: 2022-11-22 — End: 2022-11-22
  Administered 2022-11-22: 4 mg via INTRAVENOUS

## 2022-11-22 MED ORDER — PROPOFOL 10 MG/ML IV BOLUS
INTRAVENOUS | Status: DC | PRN
  Administered 2022-11-22: 150 mg via INTRAVENOUS

## 2022-11-22 NOTE — Discharge Instructions (Signed)
Please refer to discharge instructions provided by Dr. Tobi Bastos.

## 2022-11-22 NOTE — Transfer of Care (Signed)
Immediate Anesthesia Transfer of Care Note  Patient: Caleb Barnes  Procedure(s) Performed: ESOPHAGOGASTRODUODENOSCOPY (EGD)  Patient Location: PACU and Endoscopy Unit  Anesthesia Type:General  Level of Consciousness: awake, drowsy, and patient cooperative  Airway & Oxygen Therapy: Patient Spontanous Breathing  Post-op Assessment: Report given to RN and Post -op Vital signs reviewed and stable  Post vital signs: Reviewed and stable  Last Vitals:  Vitals Value Taken Time  BP 133/71 0020 11/22/22  Temp    Pulse 89   Resp 18   SpO2 100     Last Pain:  Vitals:   11/21/22 2329  TempSrc: Temporal  PainSc:          Complications: No notable events documented.

## 2022-11-22 NOTE — ED Provider Notes (Addendum)
2:30 AM  Pt was seen in the ED for esophageal food bolus.  Was taken to endoscopy by Dr. Tobi Bastos.  Procedure successful per patient and he is feeling better, swallowing without difficulty and no pain.  Now awake, alert with normal vital signs.  Does not currently have a ride home.  Will be monitored in the ED until he has a ride home in the morning given he received procedural sedation.  He has already received discharge instructions by the gastroenterologist.   Annalisa Colonna, Layla Maw, DO 11/22/22 0330   5:57 AM Pt was told multiple times by myself and other providers that he cannot drive today given he received procedural sedation and would have to wait for a ride home.  He stated to me that he would call his wife in the morning.  While waiting it appears the patient has eloped on his own and is nowhere to be found in the emergency department.   Glorie Dowlen, Layla Maw, DO 11/22/22 [phone]

## 2022-11-22 NOTE — ED Notes (Signed)
Patient stated he was going to the waiting room to await his brothers arrival. Patient had DC papers in hand. It was found that after the patient ambulated to the waiting room, shortly after he was noted to have moved from his seat and could not be located.

## 2022-11-22 NOTE — Anesthesia Procedure Notes (Signed)
Procedure Name: Intubation Date/Time: 11/22/2022 12:02 AM  Performed by: Elmarie Mainland, CRNAPre-anesthesia Checklist: Patient identified, Emergency Drugs available, Suction available and Patient being monitored Patient Re-evaluated:Patient Re-evaluated prior to induction Oxygen Delivery Method: Circle system utilized Preoxygenation: Pre-oxygenation with 100% oxygen Induction Type: IV induction and Rapid sequence Laryngoscope Size: McGraph and 4 Grade View: Grade I Tube type: Oral Tube size: 7.0 mm Number of attempts: 1 Airway Equipment and Method: Stylet and Video-laryngoscopy Placement Confirmation: ETT inserted through vocal cords under direct vision, positive ETCO2 and breath sounds checked- equal and bilateral Secured at: 22 cm Tube secured with: Tape Dental Injury: Teeth and Oropharynx as per pre-operative assessment

## 2022-11-22 NOTE — Anesthesia Postprocedure Evaluation (Signed)
Anesthesia Post Note  Patient: Anton Cheramie  Procedure(s) Performed: ESOPHAGOGASTRODUODENOSCOPY (EGD)  Patient location during evaluation: Endoscopy Anesthesia Type: General Level of consciousness: awake and alert Pain management: pain level controlled Vital Signs Assessment: post-procedure vital signs reviewed and stable Respiratory status: spontaneous breathing, nonlabored ventilation, respiratory function stable and patient connected to nasal cannula oxygen Cardiovascular status: blood pressure returned to baseline and stable Postop Assessment: no apparent nausea or vomiting Anesthetic complications: no  No notable events documented.   Last Vitals:  Vitals:   11/22/22 0040 11/22/22 0050  BP: 139/79 136/83  Pulse: 85 84  Resp: 18 20  Temp:    SpO2: 98% 98%    Last Pain:  Vitals:   11/22/22 0050  TempSrc:   PainSc: 0-No pain                 Stephanie Coup

## 2022-11-22 NOTE — ED Notes (Signed)
Spoke with patient via phone at 0615 [phone] he confirmed that he was home safe and that his brother had come and picked him up. He had no questions.

## 2022-11-22 NOTE — Op Note (Signed)
[facility] Patient Name: Caleb Barnes Procedure Date: 11/21/2022 11:49 PM MRN: [id number] Account #: [id number] Date of Birth: Jun 15, 1956 Admit Type: Outpatient Age: 66 Room: [facility] ENDO ROOM 4 Gender: Male Note Status: Finalized Instrument Name: Laurette Schimke [id number] Procedure:             Upper GI endoscopy Indications:           Foreign body in the esophagus Providers:             Wyline Mood MD, MD Medicines:             General Anesthesia Complications:         No immediate complications. Procedure:             Pre-Anesthesia Assessment:                        - Prior to the procedure, a History and Physical was                         performed, and patient medications, allergies and                         sensitivities were reviewed. The patient's tolerance                         of previous anesthesia was reviewed.                        - The risks and benefits of the procedure and the                         sedation options and risks were discussed with the                         patient. All questions were answered and informed                         consent was obtained.                        - ASA Grade Assessment: II - A patient with mild                         systemic disease.                        After obtaining informed consent, the endoscope was                         passed under direct vision. Throughout the procedure,                         the patient's blood pressure, pulse, and oxygen                         saturations were monitored continuously. The Endoscope                         was introduced through the mouth, and advanced to the  third part of duodenum. The upper GI endoscopy was                         accomplished without difficulty. The patient tolerated                         the procedure well. Findings:      Food was found in the lower third of the esophagus. Removal was        accomplished with a Roth net.      The stomach was normal.      The examined duodenum was normal.      The cardia and gastric fundus were normal on retroflexion.      One benign-appearing, intrinsic moderate (circumferential scarring or       stenosis; an endoscope may pass) stenosis was found at the       gastroesophageal junction. This stenosis measured 1.3 cm (inner       diameter) x 1 cm (in length). The stenosis was traversed. Impression:            - Food in the lower third of the esophagus. Removal                         was successful.                        - Normal stomach.                        - Normal examined duodenum.                        - Benign-appearing esophageal stenosis. Recommendation:        - Discharge patient to home (with escort).                        - Mechanical soft diet for 12 weeks.                        - Use Prilosec (omeprazole) 20 mg PO BID for 4 months.                        - Repeat upper endoscopy in 8 weeks Dilation of                         stricture.                        - Return to GI office in 3 months. Procedure Code(s):     --- Professional ---                        [phone], Esophagogastroduodenoscopy, flexible,                         transoral; with removal of foreign body(s) Diagnosis Code(s):     --- Professional ---                        F74.944H, Food in esophagus causing other injury,  initial encounter                        K22.2, Esophageal obstruction                        T18.108A, Unspecified foreign body in esophagus                         causing other injury, initial encounter CPT copyright 2022 [employer] Association. All rights reserved. The codes documented in this report are preliminary and upon coder review may  be revised to meet current compliance requirements. Wyline Mood, MD Wyline Mood MD, MD 11/22/2022 12:15:25 AM This report has been signed electronically. Number  of Addenda: 0 Note Initiated On: 11/21/2022 11:49 PM Estimated Blood Loss:  Estimated blood loss: none.      [facility]

## 2022-11-24 ENCOUNTER — Encounter: Payer: Self-pay | Admitting: Gastroenterology

## 2022-11-27 ENCOUNTER — Other Ambulatory Visit (HOSPITAL_COMMUNITY): Payer: Self-pay

## 2022-12-08 ENCOUNTER — Ambulatory Visit (INDEPENDENT_AMBULATORY_CARE_PROVIDER_SITE_OTHER): Payer: Managed Care, Other (non HMO) | Admitting: Gastroenterology

## 2022-12-08 ENCOUNTER — Encounter: Payer: Self-pay | Admitting: Gastroenterology

## 2022-12-08 ENCOUNTER — Other Ambulatory Visit: Payer: Self-pay

## 2022-12-08 VITALS — BP 147/75 | HR 76 | Temp 97.5°F | Ht 70.0 in | Wt 175.8 lb

## 2022-12-08 DIAGNOSIS — Z87898 Personal history of other specified conditions: Secondary | ICD-10-CM | POA: Diagnosis not present

## 2022-12-08 DIAGNOSIS — K222 Esophageal obstruction: Secondary | ICD-10-CM

## 2022-12-08 DIAGNOSIS — Z87891 Personal history of nicotine dependence: Secondary | ICD-10-CM | POA: Insufficient documentation

## 2022-12-08 NOTE — Progress Notes (Signed)
Wyline Mood MD, MRCP(U.K) [address]  [address]  [place], [place] [address]  Main: [phone]  Fax: [phone]   Primary Care Physician: Patient, No Pcp Per  Primary Gastroenterologist:  Dr. Wyline Mood   Chief Complaint  Patient presents with   esophageal obstruction    HPI: Caleb Barnes is a 66 y.o. male   Summary of history :  History today to see me as a hospital follow-up admitted on 11/21/2022 for difficulty swallowing after Thanksgiving dinner.  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  Taking his omeprazole 20 mg twice a day before meals.  No issues with swallowing.  He says that he has had difficulty swallowing over the years gradually getting worse.  Denies any heartburn presently.  Current Outpatient Medications  Medication Sig Dispense Refill   apixaban (ELIQUIS) 5 MG TABS tablet Take 1 tablet (5 mg total) by mouth 2 (two) times daily. 60 tablet 11   colchicine 0.6 MG tablet Take 0.6 mg by mouth.     escitalopram (LEXAPRO) 10 MG tablet Take 1 tablet (10 mg total) by mouth daily. 30 tablet 2   metoprolol succinate (TOPROL-XL) 50 MG 24 hr tablet TAKE 1 TABLET BY MOUTH EVERY DAY 90 tablet 2   rosuvastatin (CRESTOR) 10 MG tablet Take 1 tablet (10 mg total) by mouth daily. Pleas have additional refills managed by pcp 90 tablet 3   sacubitril-valsartan (ENTRESTO) 49-51 MG Take 1 tablet by mouth 2 (two) times daily. 180 tablet 3   spironolactone (ALDACTONE) 25 MG tablet Take 0.5 tablets (12.5 mg total) by mouth daily. 45 tablet 3   No current  facility-administered medications for this visit.    Allergies as of 12/08/2022   (No Known Allergies)    ROS:  General: Negative for anorexia, weight loss, fever, chills, fatigue, weakness. ENT: Negative for hoarseness, difficulty swallowing , nasal congestion. CV: Negative for chest pain, angina, palpitations, dyspnea on exertion, peripheral edema.  Respiratory: Negative for dyspnea at rest, dyspnea on exertion, cough, sputum, wheezing.  GI: See history of present illness. GU:  Negative for dysuria, hematuria, urinary incontinence, urinary frequency, nocturnal urination.  Endo: Negative for unusual weight change.    Physical Examination:   BP (!) 147/75   Pulse 76   Temp (!) 97.5 F (36.4 C) (Oral)   Ht 5\' 10"  (1.778 m)   Wt 175 lb 12.8 oz (79.7 kg)   BMI 25.22 kg/m   General: Well-nourished, well-developed in no acute distress.  Eyes: No icterus. Conjunctivae pink. Mouth: Oropharyngeal mucosa moist and pink , no lesions erythema or exudate. Neuro: Alert and oriented x 3.  Grossly intact. Skin: Warm and dry, no jaundice.   Psych: Alert and cooperative, normal mood and affect.   Imaging Studies: DG Chest 2 View  Result Date: 11/21/2022 CLINICAL DATA:  Difficulty swallowing. EXAM: CHEST - 2 VIEW COMPARISON:  April 01, 2016 FINDINGS: The heart size and mediastinal contours are within normal limits. Both lungs are clear. The visualized skeletal structures are unremarkable. IMPRESSION: No active  cardiopulmonary disease. Electronically Signed   By: Aram Candela M.D.   On: 11/21/2022 21:05    Assessment and Plan:   Caleb Barnes is a 66 y.o. y/o male presented emergency room in November 2023 with a food impaction which was taken out following which a stricture was noted with inflammation commenced on a PPI with a plan to dilate the stricture after 8 weeks of PPI therapy.  Plan 1.  Endoscopy in January 2024 to evaluate stricture or dilation and take biopsies to rule out  EOE, blood thinner holding instructions required 2.  Continue on PPI at the same dose 20 mg twice daily daily with endoscopy following which we will decide on further dosage.  I have discussed alternative options, risks & benefits,  which include, but are not limited to, bleeding, infection, perforation,respiratory complication & drug reaction.  The patient agrees with this plan & written consent will be obtained.    Dr Wyline Mood  MD,MRCP [facility]) Follow up in 3 to 4 months

## 2022-12-26 ENCOUNTER — Other Ambulatory Visit (HOSPITAL_COMMUNITY): Payer: Self-pay

## 2023-01-26 ENCOUNTER — Other Ambulatory Visit (HOSPITAL_COMMUNITY): Payer: Self-pay

## 2023-01-26 ENCOUNTER — Other Ambulatory Visit: Payer: Self-pay

## 2023-01-27 ENCOUNTER — Encounter: Payer: Self-pay | Admitting: Gastroenterology

## 2023-01-28 ENCOUNTER — Encounter: Payer: Self-pay | Admitting: Gastroenterology

## 2023-01-28 ENCOUNTER — Ambulatory Visit: Payer: Managed Care, Other (non HMO) | Admitting: Anesthesiology

## 2023-01-28 ENCOUNTER — Encounter
Admission: RE | Disposition: A | Discharge status: Home / Self Care | Payer: Self-pay | Source: Home / Self Care | Attending: Gastroenterology

## 2023-01-28 ENCOUNTER — Ambulatory Visit
Admission: RE | Admit: 2023-01-28 | Discharge: 2023-01-28 | Disposition: A | Discharge status: Home / Self Care | Payer: Managed Care, Other (non HMO) | Attending: Gastroenterology | Admitting: Gastroenterology

## 2023-01-28 DIAGNOSIS — Z87891 Personal history of nicotine dependence: Secondary | ICD-10-CM | POA: Insufficient documentation

## 2023-01-28 DIAGNOSIS — I5022 Chronic systolic (congestive) heart failure: Secondary | ICD-10-CM | POA: Insufficient documentation

## 2023-01-28 DIAGNOSIS — F419 Anxiety disorder, unspecified: Secondary | ICD-10-CM | POA: Diagnosis not present

## 2023-01-28 DIAGNOSIS — E785 Hyperlipidemia, unspecified: Secondary | ICD-10-CM | POA: Insufficient documentation

## 2023-01-28 DIAGNOSIS — J449 Chronic obstructive pulmonary disease, unspecified: Secondary | ICD-10-CM | POA: Insufficient documentation

## 2023-01-28 DIAGNOSIS — I2510 Atherosclerotic heart disease of native coronary artery without angina pectoris: Secondary | ICD-10-CM | POA: Insufficient documentation

## 2023-01-28 DIAGNOSIS — F1290 Cannabis use, unspecified, uncomplicated: Secondary | ICD-10-CM | POA: Insufficient documentation

## 2023-01-28 DIAGNOSIS — Z87898 Personal history of other specified conditions: Secondary | ICD-10-CM

## 2023-01-28 DIAGNOSIS — K2100 Gastro-esophageal reflux disease with esophagitis, without bleeding: Secondary | ICD-10-CM | POA: Insufficient documentation

## 2023-01-28 DIAGNOSIS — R1310 Dysphagia, unspecified: Secondary | ICD-10-CM | POA: Insufficient documentation

## 2023-01-28 DIAGNOSIS — Z7901 Long term (current) use of anticoagulants: Secondary | ICD-10-CM | POA: Insufficient documentation

## 2023-01-28 DIAGNOSIS — K222 Esophageal obstruction: Secondary | ICD-10-CM

## 2023-01-28 HISTORY — PX: ESOPHAGOGASTRODUODENOSCOPY (EGD) WITH PROPOFOL: SHX5813

## 2023-01-28 SURGERY — ESOPHAGOGASTRODUODENOSCOPY (EGD) WITH PROPOFOL
Anesthesia: General

## 2023-01-28 MED ORDER — PROPOFOL 500 MG/50ML IV EMUL
INTRAVENOUS | Status: DC | PRN
  Administered 2023-01-28: 120 ug/kg/min via INTRAVENOUS
  Administered 2023-01-28: 100 mg via INTRAVENOUS

## 2023-01-28 MED ORDER — GLYCOPYRROLATE 0.2 MG/ML IJ SOLN
INTRAMUSCULAR | Status: DC | PRN
  Administered 2023-01-28: .2 mg via INTRAVENOUS

## 2023-01-28 MED ORDER — LIDOCAINE HCL (CARDIAC) PF 100 MG/5ML IV SOSY
PREFILLED_SYRINGE | INTRAVENOUS | Status: DC | PRN
  Administered 2023-01-28: 60 mg via INTRAVENOUS

## 2023-01-28 MED ORDER — SODIUM CHLORIDE 0.9 % IV SOLN: INTRAVENOUS | Status: DC

## 2023-01-28 MED ORDER — ONDANSETRON HCL 4 MG/2ML IJ SOLN
INTRAMUSCULAR | Status: DC | PRN
  Administered 2023-01-28: 4 mg via INTRAVENOUS

## 2023-01-28 MED ORDER — SODIUM CHLORIDE 0.9 % IV SOLN: INTRAVENOUS | Status: DC | PRN

## 2023-01-28 NOTE — Anesthesia Preprocedure Evaluation (Addendum)
Anesthesia Evaluation  Patient identified by MRN, date of birth, ID band Patient awake    Reviewed: Allergy & Precautions, NPO status , Patient's Chart, lab work & pertinent test results  History of Anesthesia Complications Negative for: history of anesthetic complications  Airway Mallampati: I  TM Distance: >3 FB Neck ROM: full    Dental  (+) Dental Advidsory Given, Edentulous Lower, Edentulous Upper   Pulmonary neg sleep apnea, COPD, neg recent URI, former smoker    + wheezing      Cardiovascular (-) Past MI, (-) CABG and (-) CHF Normal cardiovascular exam+ dysrhythmias (pAF)  Rhythm:Regular Rate:Normal  Chronic systolic CHF: Nonischemic cardiomyopathy.  Possible etiologies include tachy-mediated CMP with atrial fib/RVR of unknown duration and ETOH CMP.  - R/LHC (4/17): Mild nonobstructive CAD; mean RA 7, PA 43/25, mean PCWP 21, CI 1.41 Fick and 1.78 thermodilution.  - Echo (4/17): EF < 20%, moderate LV dilation, mid to moderately decreased RV systolic function.  - Echo (8/17): EF 55-60%, moderate LVH, normal RV size and systolic function.  - Echo (8/18): EF 50-55%, PASP 28 mmHg - Echo (7/19): EF 55-60%, normal.  - Echo (7/21): EF 60-65%, normal    Neuro/Psych  PSYCHIATRIC DISORDERS      negative neurological ROS     GI/Hepatic negative GI ROS, Neg liver ROS,,,  Endo/Other  negative endocrine ROS    Renal/GU Renal disease     Musculoskeletal   Abdominal   Peds  Hematology negative hematology ROS (+)   Anesthesia Other Findings Past Medical History: No date: Acute renal failure (HCC) No date: Anxiety No date: CHF (congestive heart failure) (HCC) No date: COPD (chronic obstructive pulmonary disease) (HCC) No date: Heart disease No date: Hyperlipidemia No date: Polysubstance abuse ([place]) No date: Shortness of breath dyspnea  Past Surgical History: 04/04/2016: CARDIAC CATHETERIZATION; N/A     Comment:   Procedure: Right/Left Heart Cath and Coronary               Angiography;  Surgeon: Larey Dresser, MD;  Location: [facility];  Service: Cardiovascular;  Laterality:               N/A;  BMI    Body Mass Index: 24.39 kg/m      Reproductive/Obstetrics negative OB ROS                             Anesthesia Physical Anesthesia Plan  ASA: 3  Anesthesia Plan: General   Post-op Pain Management: Minimal or no pain anticipated   Induction: Intravenous  PONV Risk Score and Plan: 3 and Treatment may vary due to age or medical condition, TIVA and Propofol infusion  Airway Management Planned: Natural Airway  Additional Equipment:   Intra-op Plan:   Post-operative Plan:   Informed Consent: I have reviewed the patients History and Physical, chart, labs and discussed the procedure including the risks, benefits and alternatives for the proposed anesthesia with the patient or authorized representative who has indicated his/her understanding and acceptance.     Dental Advisory Given  Plan Discussed with: Anesthesiologist, CRNA and Surgeon  Anesthesia Plan Comments:        Anesthesia Quick Evaluation

## 2023-01-28 NOTE — Op Note (Signed)
[facility] Patient Name: Caleb Barnes Procedure Date: 01/28/2023 8:19 AM MRN: [id number] Account #: [id number] Date of Birth: [birth year] Admit Type: Outpatient Age: 67 Room: [facility] ENDO ROOM 3 Gender: Male Note Status: Finalized Instrument Name: Upper Endoscope [id number] Procedure:             Upper GI endoscopy Indications:           Dysphagia Providers:             Jonathon Bellows MD, MD Referring MD:          No Local Md, MD (Referring MD) Medicines:             Monitored Anesthesia Care Complications:         No immediate complications. Procedure:             Pre-Anesthesia Assessment:                        - Prior to the procedure, a History and Physical was                         performed, and patient medications, allergies and                         sensitivities were reviewed. The patient's tolerance                         of previous anesthesia was reviewed.                        - The risks and benefits of the procedure and the                         sedation options and risks were discussed with the                         patient. All questions were answered and informed                         consent was obtained.                        - ASA Grade Assessment: II - A patient with mild                         systemic disease.                        After obtaining informed consent, the endoscope was                         passed under direct vision. Throughout the procedure,                         the patient's blood pressure, pulse, and oxygen                         saturations were monitored continuously. The Endoscope                         was introduced  through the mouth, and advanced to the                         third part of duodenum. The upper GI endoscopy was                         accomplished with ease. The patient tolerated the                         procedure well. Findings:      The examined esophagus was  normal. Biopsies were taken with a cold       forceps for histology.      The stomach was normal.      The examined duodenum was normal.      The cardia and gastric fundus were normal on retroflexion. Impression:            - Normal esophagus. Biopsied.                        - Normal stomach.                        - Normal examined duodenum. Recommendation:        - Await pathology results.                        - Discharge patient to home (with escort).                        - Resume previous diet.                        - Continue present medications.                        - Return to my office as previously scheduled. Procedure Code(s):     --- Professional ---                        [phone], Esophagogastroduodenoscopy, flexible,                         transoral; with biopsy, single or multiple Diagnosis Code(s):     --- Professional ---                        R13.10, Dysphagia, unspecified CPT copyright 2022 American Medical Association. All rights reserved. The codes documented in this report are preliminary and upon coder review may  be revised to meet current compliance requirements. Jonathon Bellows, MD Jonathon Bellows MD, MD 01/28/2023 8:35:30 AM This report has been signed electronically. Number of Addenda: 0 Note Initiated On: 01/28/2023 8:19 AM Estimated Blood Loss:  Estimated blood loss: none.      [facility]

## 2023-01-28 NOTE — Transfer of Care (Signed)
Immediate Anesthesia Transfer of Care Note  Patient: Gram Siedlecki  Procedure(s) Performed: ESOPHAGOGASTRODUODENOSCOPY (EGD) WITH PROPOFOL  Patient Location: PACU  Anesthesia Type:MAC  Level of Consciousness: awake  Airway & Oxygen Therapy: Patient Spontanous Breathing and Patient connected to face mask oxygen  Post-op Assessment: Report given to RN and Post -op Vital signs reviewed and stable  Post vital signs: Reviewed  Last Vitals:  Vitals Value Taken Time  BP 116/69 01/28/23 0838  Temp 45F   Pulse 71 01/28/23 0838  Resp 17 01/28/23 0838  SpO2 100 % 01/28/23 0838  Vitals shown include unvalidated device data.  Last Pain:  Vitals:   01/28/23 0719  TempSrc: Temporal  PainSc: 0-No pain         Complications: No notable events documented.

## 2023-01-28 NOTE — Anesthesia Postprocedure Evaluation (Signed)
Anesthesia Post Note  Patient: Caleb Barnes  Procedure(s) Performed: ESOPHAGOGASTRODUODENOSCOPY (EGD) WITH PROPOFOL  Patient location during evaluation: Endoscopy Anesthesia Type: General Level of consciousness: awake and alert Pain management: pain level controlled Vital Signs Assessment: post-procedure vital signs reviewed and stable Respiratory status: spontaneous breathing, nonlabored ventilation and respiratory function stable Cardiovascular status: blood pressure returned to baseline and stable Postop Assessment: no apparent nausea or vomiting Anesthetic complications: no   No notable events documented.   Last Vitals:  Vitals:   01/28/23 0719 01/28/23 0839  BP: 133/75 116/69  Pulse: 65   Resp: 17   Temp: (!) 35.7 C (!) 35.9 C  SpO2: 100%     Last Pain:  Vitals:   01/28/23 0849  TempSrc:   PainSc: 0-No pain                 [facility]

## 2023-01-28 NOTE — H&P (Signed)
Caleb Bellows, MD [address] [place], [place], [place], [address] [id number] [place], [place], [place], [address] Phone: [phone]  Fax: [phone]  Primary Care Physician:  Patient, No Pcp Per   Pre-Procedure History & Physical: HPI:  Caleb Barnes is a 67 y.o. male is here for an endoscopy    Past Medical History:  Diagnosis Date   Acute renal failure (HCC)    Anxiety    CHF (congestive heart failure) (HCC)    COPD (chronic obstructive pulmonary disease) ([place])    Heart disease    Hyperlipidemia    Polysubstance abuse ([place])    Shortness of breath dyspnea     Past Surgical History:  Procedure Laterality Date   CARDIAC CATHETERIZATION N/A 04/04/2016   Procedure: Right/Left Heart Cath and Coronary Angiography;  Surgeon: Larey Dresser, MD;  Location: [facility];  Service: Cardiovascular;  Laterality: N/A;   ESOPHAGOGASTRODUODENOSCOPY N/A 11/21/2022   Procedure: ESOPHAGOGASTRODUODENOSCOPY (EGD);  Surgeon: Caleb Bellows, MD;  Location: [facility] ENDOSCOPY;  Service: Gastroenterology;  Laterality: N/A;    Prior to Admission medications   Medication Sig Start Date End Date Taking? Authorizing Provider  colchicine 0.6 MG tablet Take 0.6 mg by mouth. 06/22/22  Yes [provider]  escitalopram (LEXAPRO) 10 MG tablet Take 1 tablet (10 mg total) by mouth daily. 11/09/20  Yes Elby Beck, FNP  metoprolol succinate (TOPROL-XL) 50 MG 24 hr tablet TAKE 1 TABLET BY MOUTH EVERY DAY 06/18/22  Yes Larey Dresser, MD  rosuvastatin (CRESTOR) 10 MG tablet Take 1 tablet (10 mg total) by mouth daily. Pleas have additional refills managed by pcp 01/13/22  Yes Larey Dresser, MD  sacubitril-valsartan (ENTRESTO) 49-51 MG Take 1 tablet by mouth 2 (two) times daily. 08/29/22  Yes Larey Dresser, MD  spironolactone (ALDACTONE) 25 MG tablet Take 0.5 tablets (12.5 mg total) by mouth daily. 03/10/22  Yes Larey Dresser, MD  apixaban (ELIQUIS) 5 MG TABS tablet Take 1 tablet  (5 mg total) by mouth 2 (two) times daily. 04/16/22   Larey Dresser, MD    Allergies as of 12/08/2022   (No Known Allergies)    Family History  Problem Relation Age of Onset   CAD Father    Depression Father    COPD Mother    Hypertension Mother     Social History   Socioeconomic History   Marital status: Divorced    Spouse name: Not on file   Number of children: Not on file   Years of education: Not on file   Highest education level: Not on file  Occupational History   Not on file  Tobacco Use   Smoking status: Former    Packs/day: 2.00    Years: 50.00    Total pack years: 100.00    Types: Cigarettes    Quit date: 2014    Years since quitting: 10.0   Smokeless tobacco: Never  Vaping Use   Vaping Use: Never used  Substance and Sexual Activity   Alcohol use: Yes    Alcohol/week: 3.0 standard drinks of alcohol    Types: 3 Cans of beer per week   Drug use: Yes    Frequency: 2.0 times per week    Types: Marijuana   Sexual activity: Not on file  Other Topics Concern   Not on file  Social History Narrative   Not on file   Social Determinants of Health   Financial Resource Strain: Not  on file  Food Insecurity: Not on file  Transportation Needs: Not on file  Physical Activity: Not on file  Stress: Not on file  Social Connections: Not on file  Intimate Partner Violence: Not on file    Review of Systems: See HPI, otherwise negative ROS  Physical Exam: BP 133/75   Pulse 65   Temp (!) 96.2 F (35.7 C) (Temporal)   Resp 17   Ht 5\' 10"  (1.778 m)   Wt 79.4 kg   SpO2 100%   BMI 25.11 kg/m  General:   Alert,  pleasant and cooperative in NAD Head:  Normocephalic and atraumatic. Neck:  Supple; no masses or thyromegaly. Lungs:  Clear throughout to auscultation, normal respiratory effort.    Heart:  +S1, +S2, Regular rate and rhythm, No edema. Abdomen:  Soft, nontender and nondistended. Normal bowel sounds, without guarding, and without rebound.    Neurologic:  Alert and  oriented x4;  grossly normal neurologically.  Impression/Plan: Caleb Barnes is here for an endoscopy  to be performed for  evaluation of dysphagia    Risks, benefits, limitations, and alternatives regarding endoscopy have been reviewed with the patient.  Questions have been answered.  All parties agreeable.   Caleb Bellows, MD  01/28/2023, 8:14 AM

## 2023-01-29 ENCOUNTER — Encounter: Payer: Self-pay | Admitting: Gastroenterology

## 2023-01-29 LAB — SURGICAL PATHOLOGY

## 2023-02-02 ENCOUNTER — Encounter: Payer: Self-pay | Admitting: Gastroenterology

## 2023-02-26 ENCOUNTER — Other Ambulatory Visit: Payer: Self-pay

## 2023-03-02 ENCOUNTER — Other Ambulatory Visit (HOSPITAL_COMMUNITY): Payer: Self-pay

## 2023-03-03 ENCOUNTER — Other Ambulatory Visit: Payer: Self-pay

## 2023-03-17 ENCOUNTER — Other Ambulatory Visit (HOSPITAL_COMMUNITY): Payer: Self-pay | Admitting: Cardiology

## 2023-03-26 ENCOUNTER — Other Ambulatory Visit (HOSPITAL_COMMUNITY): Payer: Self-pay

## 2023-03-31 ENCOUNTER — Other Ambulatory Visit (HOSPITAL_COMMUNITY): Payer: Self-pay

## 2023-04-08 ENCOUNTER — Ambulatory Visit
Admission: EM | Admit: 2023-04-08 | Discharge: 2023-04-08 | Disposition: A | Discharge status: Home / Self Care | Payer: Managed Care, Other (non HMO) | Attending: Emergency Medicine | Admitting: Emergency Medicine

## 2023-04-08 ENCOUNTER — Ambulatory Visit: Payer: Managed Care, Other (non HMO) | Admitting: General Practice

## 2023-04-08 ENCOUNTER — Encounter
Admission: EM | Disposition: A | Discharge status: Home / Self Care | Payer: Self-pay | Source: Home / Self Care | Attending: Emergency Medicine

## 2023-04-08 ENCOUNTER — Encounter: Payer: Self-pay | Admitting: Intensive Care

## 2023-04-08 ENCOUNTER — Other Ambulatory Visit: Payer: Self-pay | Admitting: Gastroenterology

## 2023-04-08 ENCOUNTER — Other Ambulatory Visit: Payer: Self-pay

## 2023-04-08 DIAGNOSIS — Z09 Encounter for follow-up examination after completed treatment for conditions other than malignant neoplasm: Secondary | ICD-10-CM | POA: Diagnosis not present

## 2023-04-08 DIAGNOSIS — T18128A Food in esophagus causing other injury, initial encounter: Secondary | ICD-10-CM | POA: Insufficient documentation

## 2023-04-08 DIAGNOSIS — R1314 Dysphagia, pharyngoesophageal phase: Secondary | ICD-10-CM | POA: Insufficient documentation

## 2023-04-08 DIAGNOSIS — I509 Heart failure, unspecified: Secondary | ICD-10-CM | POA: Diagnosis not present

## 2023-04-08 DIAGNOSIS — R1319 Other dysphagia: Secondary | ICD-10-CM

## 2023-04-08 DIAGNOSIS — Z79899 Other long term (current) drug therapy: Secondary | ICD-10-CM | POA: Insufficient documentation

## 2023-04-08 DIAGNOSIS — W44F3XA Food entering into or through a natural orifice, initial encounter: Secondary | ICD-10-CM | POA: Diagnosis not present

## 2023-04-08 DIAGNOSIS — Z7901 Long term (current) use of anticoagulants: Secondary | ICD-10-CM | POA: Insufficient documentation

## 2023-04-08 DIAGNOSIS — I4891 Unspecified atrial fibrillation: Secondary | ICD-10-CM | POA: Diagnosis not present

## 2023-04-08 DIAGNOSIS — Z87891 Personal history of nicotine dependence: Secondary | ICD-10-CM | POA: Diagnosis not present

## 2023-04-08 DIAGNOSIS — I4892 Unspecified atrial flutter: Secondary | ICD-10-CM | POA: Diagnosis not present

## 2023-04-08 HISTORY — PX: ESOPHAGOGASTRODUODENOSCOPY (EGD) WITH PROPOFOL: SHX5813

## 2023-04-08 SURGERY — ESOPHAGOGASTRODUODENOSCOPY (EGD) WITH PROPOFOL
Anesthesia: General

## 2023-04-08 MED ORDER — FENTANYL CITRATE (PF) 100 MCG/2ML IJ SOLN
INTRAMUSCULAR | Status: DC | PRN
  Administered 2023-04-08: 25 ug via INTRAVENOUS

## 2023-04-08 MED ORDER — SUCCINYLCHOLINE CHLORIDE 200 MG/10ML IV SOSY
PREFILLED_SYRINGE | INTRAVENOUS | Status: DC | PRN
  Administered 2023-04-08: 100 mg via INTRAVENOUS

## 2023-04-08 MED ORDER — LIDOCAINE HCL (PF) 2 % IJ SOLN
INTRAMUSCULAR | Status: AC
  Filled 2023-04-08: qty 5

## 2023-04-08 MED ORDER — SUCCINYLCHOLINE CHLORIDE 200 MG/10ML IV SOSY
PREFILLED_SYRINGE | INTRAVENOUS | Status: AC
  Filled 2023-04-08: qty 10

## 2023-04-08 MED ORDER — SODIUM CHLORIDE 0.9 % IV SOLN: INTRAVENOUS | Status: DC

## 2023-04-08 MED ORDER — OMEPRAZOLE 40 MG PO CPDR
40.0000 mg | DELAYED_RELEASE_CAPSULE | Freq: Two times a day (BID) | ORAL | 3 refills | Status: DC
Start: 2023-04-08 — End: 2023-07-06

## 2023-04-08 MED ORDER — FENTANYL CITRATE (PF) 100 MCG/2ML IJ SOLN
INTRAMUSCULAR | Status: AC
  Filled 2023-04-08: qty 2

## 2023-04-08 MED ORDER — PROPOFOL 10 MG/ML IV BOLUS
INTRAVENOUS | Status: DC | PRN
  Administered 2023-04-08: 150 mg via INTRAVENOUS

## 2023-04-08 MED ORDER — LIDOCAINE HCL (CARDIAC) PF 100 MG/5ML IV SOSY
PREFILLED_SYRINGE | INTRAVENOUS | Status: DC | PRN
  Administered 2023-04-08: 100 mg via INTRAVENOUS

## 2023-04-08 MED ORDER — GLUCAGON HCL RDNA (DIAGNOSTIC) 1 MG IJ SOLR
1.0000 mg | Freq: Once | INTRAMUSCULAR | Status: AC
  Administered 2023-04-08: 1 mg via INTRAVENOUS
  Filled 2023-04-08: qty 1

## 2023-04-08 MED ORDER — DEXAMETHASONE SODIUM PHOSPHATE 10 MG/ML IJ SOLN
INTRAMUSCULAR | Status: AC
  Filled 2023-04-08: qty 1

## 2023-04-08 MED ORDER — GLYCOPYRROLATE 0.2 MG/ML IJ SOLN
INTRAMUSCULAR | Status: DC | PRN
  Administered 2023-04-08: .2 mg via INTRAVENOUS

## 2023-04-08 MED ORDER — DEXAMETHASONE SODIUM PHOSPHATE 10 MG/ML IJ SOLN
INTRAMUSCULAR | Status: DC | PRN
  Administered 2023-04-08: 10 mg via INTRAVENOUS

## 2023-04-08 MED ORDER — ONDANSETRON HCL 4 MG/2ML IJ SOLN
INTRAMUSCULAR | Status: DC | PRN
  Administered 2023-04-08: 4 mg via INTRAVENOUS

## 2023-04-08 MED ORDER — MIDAZOLAM HCL 2 MG/2ML IJ SOLN
INTRAMUSCULAR | Status: DC | PRN
  Administered 2023-04-08: 2 mg via INTRAVENOUS

## 2023-04-08 MED ORDER — ONDANSETRON HCL 4 MG/2ML IJ SOLN
INTRAMUSCULAR | Status: AC
  Filled 2023-04-08: qty 2

## 2023-04-08 MED ORDER — GLYCOPYRROLATE 0.2 MG/ML IJ SOLN
INTRAMUSCULAR | Status: AC
  Filled 2023-04-08: qty 1

## 2023-04-08 MED ORDER — PROPOFOL 10 MG/ML IV BOLUS
INTRAVENOUS | Status: AC
  Filled 2023-04-08: qty 20

## 2023-04-08 MED ORDER — MIDAZOLAM HCL 2 MG/2ML IJ SOLN
INTRAMUSCULAR | Status: AC
  Filled 2023-04-08: qty 2

## 2023-04-08 NOTE — Op Note (Signed)
[facility] Patient Name: Caleb Barnes Procedure Date: 04/08/2023 3:00 PM MRN: [id number] Account #: [id number] Date of Birth: 04/10/1956 Admit Type: Outpatient Age: 67 Room: [facility] ENDO ROOM 2 Gender: Male Note Status: Finalized Instrument Name: Upper Endoscope [id number] Procedure:             Upper GI endoscopy Indications:           Esophageal dysphagia, Foreign body in the esophagus Providers:             Toney Reil MD, MD Referring MD:          No Local Md, MD (Referring MD) Medicines:             General Anesthesia Complications:         No immediate complications. Estimated blood loss:                         Minimal. Procedure:             Pre-Anesthesia Assessment:                        - Prior to the procedure, a History and Physical was                         performed, and patient medications and allergies were                         reviewed. The patient is competent. The risks and                         benefits of the procedure and the sedation options and                         risks were discussed with the patient. All questions                         were answered and informed consent was obtained.                         Patient identification and proposed procedure were                         verified by the physician, the nurse, the                         anesthesiologist, the anesthetist and the technician                         in the pre-procedure area in the procedure room in the                         endoscopy suite. Mental Status Examination: alert and                         oriented. Airway Examination: normal oropharyngeal                         airway and neck mobility. Respiratory Examination:  clear to auscultation. CV Examination: normal.                         Prophylactic Antibiotics: The patient does not require                         prophylactic antibiotics. Prior  Anticoagulants: The                         patient has taken Eliquis (apixaban), last dose was 2                         days prior to procedure. ASA Grade Assessment: IV - A                         patient with severe systemic disease that is a                         constant threat to life. After reviewing the risks and                         benefits, the patient was deemed in satisfactory                         condition to undergo the procedure. The anesthesia                         plan was to use general anesthesia. Immediately prior                         to administration of medications, the patient was                         re-assessed for adequacy to receive sedatives. The                         heart rate, respiratory rate, oxygen saturations,                         blood pressure, adequacy of pulmonary ventilation, and                         response to care were monitored throughout the                         procedure. The physical status of the patient was                         re-assessed after the procedure.                        After obtaining informed consent, the endoscope was                         passed under direct vision. Throughout the procedure,                         the patient's blood pressure, pulse, and oxygen  saturations were monitored continuously. The Endoscope                         was introduced through the mouth, and advanced to the                         second part of duodenum. The upper GI endoscopy was                         accomplished without difficulty. The patient tolerated                         the procedure well. Findings:      Food was found in the lower third of the esophagus and at the       gastroesophageal junction. Removal was accomplished with pushing food       bolus into the stomach with mild resistance. Estimated blood loss was       minimal resulting in superficial tear at the GE  junction.      The entire examined stomach was normal.      The cardia and gastric fundus were normal on retroflexion.      The gastroesophageal junction and examined esophagus were normal. Impression:            - Food in the lower third of the esophagus and at the                         gastroesophageal junction. Removal was successful.                        - Normal stomach.                        - Normal gastroesophageal junction and esophagus. Recommendation:        - Discharge patient to home (with escort).                        - Full liquid diet today.                        - Continue present medications.                        - Resume Eliquis (apixaban) in 2 days/saturday at                         prior dose. Refer to managing physician for further                         adjustment of therapy.                        - Use Prilosec (omeprazole) 40 mg PO BID long term.                        - Return to GI clinic with Dr Tobi BastosAnna at appointment to                         be scheduled, patient will probably benefit from  empiric dilation given h/o dysphagia and food impaction                        - Esophageal manometry to evaluate for esophageal                         dysmotility. Procedure Code(s):     --- Professional ---                        [phone], Esophagogastroduodenoscopy, flexible,                         transoral; with removal of foreign body(s) Diagnosis Code(s):     --- Professional ---                        T02.111N, Food in esophagus causing other injury,                         initial encounter                        R13.14, Dysphagia, pharyngoesophageal phase                        T18.108A, Unspecified foreign body in esophagus                         causing other injury, initial encounter CPT copyright 2022 American Medical Association. All rights reserved. The codes documented in this report are preliminary and upon coder review  may  be revised to meet current compliance requirements. Dr. Libby Maw Toney Reil MD, MD 04/08/2023 4:30:49 PM This report has been signed electronically. Number of Addenda: 0 Note Initiated On: 04/08/2023 3:00 PM Estimated Blood Loss:  Estimated blood loss was minimal. Estimated blood loss                         was minimal.      [facility]

## 2023-04-08 NOTE — Anesthesia Preprocedure Evaluation (Addendum)
Anesthesia Evaluation  Patient identified by MRN, date of birth, ID band Patient awake    Reviewed: Allergy & Precautions, NPO status , Patient's Chart, lab work & pertinent test results  History of Anesthesia Complications Negative for: history of anesthetic complications  Airway Mallampati: III  TM Distance: <3 FB Neck ROM: full    Dental  (+) Upper Dentures, Lower Dentures   Pulmonary shortness of breath and with exertion, COPD, former smoker   Pulmonary exam normal        Cardiovascular Exercise Tolerance: Good (-) angina +CHF  (-) Past MI + dysrhythmias Atrial Fibrillation      Neuro/Psych   Anxiety     negative neurological ROS  negative psych ROS   GI/Hepatic negative GI ROS, Neg liver ROS,,,  Endo/Other  negative endocrine ROS    Renal/GU Renal disease     Musculoskeletal   Abdominal   Peds  Hematology negative hematology ROS (+)   Anesthesia Other Findings Food bolus  Past Medical History: No date: Acute renal failure No date: Anxiety No date: CHF (congestive heart failure) No date: COPD (chronic obstructive pulmonary disease) No date: Heart disease No date: Hyperlipidemia No date: Polysubstance abuse No date: Shortness of breath dyspnea  Past Surgical History: 04/04/2016: CARDIAC CATHETERIZATION; N/A     Comment:  Procedure: Right/Left Heart Cath and Coronary               Angiography;  Surgeon: Laurey Morale, MD;  Location: [facility]              INVASIVE CV LAB;  Service: Cardiovascular;  Laterality:               N/A; 11/21/2022: ESOPHAGOGASTRODUODENOSCOPY; N/A     Comment:  Procedure: ESOPHAGOGASTRODUODENOSCOPY (EGD);  Surgeon:               Wyline Mood, MD;  Location: [facility] ENDOSCOPY;  Service:               Gastroenterology;  Laterality: N/A; 01/28/2023: ESOPHAGOGASTRODUODENOSCOPY (EGD) WITH PROPOFOL; N/A     Comment:  Procedure: ESOPHAGOGASTRODUODENOSCOPY (EGD) WITH               PROPOFOL;   Surgeon: Wyline Mood, MD;  Location: [facility]               ENDOSCOPY;  Service: Gastroenterology;  Laterality: N/A;  BMI    Body Mass Index: 24.39 kg/m      Reproductive/Obstetrics negative OB ROS                             Anesthesia Physical Anesthesia Plan  ASA: 4  Anesthesia Plan: General ETT, Rapid Sequence and Cricoid Pressure   Post-op Pain Management:    Induction: Intravenous  PONV Risk Score and Plan: Ondansetron, Dexamethasone, Midazolam and Treatment may vary due to age or medical condition  Airway Management Planned: Oral ETT  Additional Equipment:   Intra-op Plan:   Post-operative Plan: Extubation in OR  Informed Consent: I have reviewed the patients History and Physical, chart, labs and discussed the procedure including the risks, benefits and alternatives for the proposed anesthesia with the patient or authorized representative who has indicated his/her understanding and acceptance.     Dental Advisory Given  Plan Discussed with: Anesthesiologist, CRNA and Surgeon  Anesthesia Plan Comments: (Patient consented for risks of anesthesia including but not limited to:  - adverse reactions to medications - damage to eyes,  teeth, lips or other oral mucosa - nerve damage due to positioning  - sore throat or hoarseness - Damage to heart, brain, nerves, lungs, other parts of body or loss of life  Patient voiced understanding.)       Anesthesia Quick Evaluation

## 2023-04-08 NOTE — Consult Note (Signed)
Arlyss Repressohini R Michai Dieppa, MD [address]  [address]  [place], [place] [address]  Main: [phone]  Fax: [phone] Pager: [phone]   Consultation  Referring Provider:     No ref. provider found Primary Care Physician:  Patient, No Pcp Per Primary Gastroenterologist:  Dr. Tobi BastosAnna        Reason for Consultation: Food bolus, history of dysphagia and food impaction Date of Admission:  04/08/2023 Date of Consultation:  04/08/2023      HPI:   Caleb Barnes is a 67 y.o. male history of chronic dysphagia to solids, known history of food impaction in 10/2022, underwent EGD, with removal of food bolus followed by elective upper endoscopy on 01/28/2023, there was no evidence of stricture, esophageal biopsies were performed which revealed esophagitis secondary to reflux with no evidence of eosinophilic esophagitis.  Patient was taking omeprazole 20 mg p.o. twice daily when he was seen by Dr. Tobi BastosAnna as outpatient.  Patient presented to ER this morning secondary to chicken piece stuck in his throat after dinner yesterday.  Patient did not know that he has to continue taking Prilosec long-term.  He states that he has been having ongoing symptoms of difficulty swallowing with solids Last dose of Eliquis this morning  NSAIDs: None  Antiplts/Anticoagulants/Anti thrombotics: Eliquis for history of A-fib  GI Procedures:  EGD 01/28/2023 DIAGNOSIS:  A. ESOPHAGUS; BIOPSIES:  - MODERATE ESOPHAGITIS, FEATURES OF REFLUX ASSOCIATED.  - NO EVIDENCE OF BARRETT'S MUCOSA.  - REACTIVE NUCLEAR CHANGES BUT NO DYSPLASIA.   Past Medical History:  Diagnosis Date   Acute renal failure    Anxiety    CHF (congestive heart failure)    COPD (chronic obstructive pulmonary disease)    Heart disease    Hyperlipidemia    Polysubstance abuse    Shortness of breath dyspnea     Past Surgical History:  Procedure Laterality Date   CARDIAC CATHETERIZATION N/A 04/04/2016   Procedure: Right/Left Heart Cath and Coronary  Angiography;  Surgeon: Laurey Moralealton S McLean, MD;  [facility];  Service: Cardiovascular;  Laterality: N/A;   ESOPHAGOGASTRODUODENOSCOPY N/A 11/21/2022   Procedure: ESOPHAGOGASTRODUODENOSCOPY (EGD);  Surgeon: Wyline MoodAnna, Kiran, MD;  [facility];  Service: Gastroenterology;  Laterality: N/A;   ESOPHAGOGASTRODUODENOSCOPY (EGD) WITH PROPOFOL N/A 01/28/2023   Procedure: ESOPHAGOGASTRODUODENOSCOPY (EGD) WITH PROPOFOL;  Surgeon: Wyline MoodAnna, Kiran, MD;  [facility];  Service: Gastroenterology;  Laterality: N/A;     Current Facility-Administered Medications:    0.9 %  sodium chloride infusion, , Intravenous, Continuous, Kimerly Rowand, Loel Dubonnetohini Reddy, MD, Last Rate: 20 mL/hr at 04/08/23 1610, Continued from Pre-op at 04/08/23 1610   Family History  Problem Relation Age of Onset   CAD Father    Depression Father    COPD Mother    Hypertension Mother      Social History   Tobacco Use   Smoking status: Former    Packs/day: 2.00    Years: 50.00    Additional pack years: 0.00    Total pack years: 100.00    Types: Cigarettes    Quit date: 2014    Years since quitting: 10.2   Smokeless tobacco: Never  Vaping Use   Vaping Use: Never used  Substance Use Topics   Alcohol use: Yes    Alcohol/week: 3.0 standard drinks of alcohol    Types: 3 Cans of beer per week   Drug use: Yes    Frequency: 2.0 times per week    Types: Marijuana  Allergies as of 04/08/2023 - Review Complete 04/08/2023  Allergen Reaction Noted   Bee venom  04/08/2023    Review of Systems:    All systems reviewed and negative except where noted in HPI.   Physical Exam:  Vital signs in last 24 hours: Temp:  [96.5 F (35.8 C)-98.4 F (36.9 C)] 96.5 F (35.8 C) (04/10 1635) Pulse Rate:  [50-96] 96 (04/10 1635) Resp:  [17-18] 17 (04/10 1635) BP: (114-141)/(61-81) 114/76 (04/10 1635) SpO2:  [97 %-100 %] 100 % (04/10 1635) Weight:  [77.1 kg] 77.1 kg (04/10 1054)   General:   Pleasant, cooperative in  NAD Head:  Normocephalic and atraumatic. Eyes:   No icterus.   Conjunctiva pink. PERRLA. Ears:  Normal auditory acuity. Neck:  Supple; no masses or thyroidomegaly Lungs: Respirations even and unlabored. Lungs clear to auscultation bilaterally.   No wheezes, crackles, or rhonchi.  Heart:  Regular rate and rhythm;  Without murmur, clicks, rubs or gallops Abdomen:  Soft, nondistended, nontender. Normal bowel sounds. No appreciable masses or hepatomegaly.  No rebound or guarding.  Rectal:  Not performed. Msk:  Symmetrical without gross deformities.  Strength normal Extremities:  Without edema, cyanosis or clubbing. Neurologic:  Alert and oriented x3;  grossly normal neurologically. Skin:  Intact without significant lesions or rashes. Psych:  Alert and cooperative. Normal affect.  LAB RESULTS:    Latest Ref Rng & Units 11/21/2022    8:44 PM 01/13/2022   10:18 AM 09/26/2020   10:30 AM  CBC  WBC 4.0 - 10.5 K/uL 13.8  6.9  5.8   Hemoglobin 13.0 - 17.0 g/dL 16.1  09.6  04.5   Hematocrit 39.0 - 52.0 % 44.2  38.0  44.3   Platelets 150 - 400 K/uL 289  249  295     BMET    Latest Ref Rng & Units 11/21/2022    8:44 PM 04/14/2022    9:16 AM 01/13/2022   10:18 AM  BMP  Glucose 70 - 99 mg/dL 409  811  914   BUN 8 - 23 mg/dL 27  10  16    Creatinine 0.61 - 1.24 mg/dL 7.82  9.56  2.13   Sodium 135 - 145 mmol/L 142  139  138   Potassium 3.5 - 5.1 mmol/L 4.2  4.4  4.3   Chloride 98 - 111 mmol/L 106  107  105   CO2 22 - 32 mmol/L 23  27  25    Calcium 8.9 - 10.3 mg/dL 08.6  9.1  9.4     LFT    Latest Ref Rng & Units 03/04/2021    3:40 PM 12/26/2020    3:16 PM 01/11/2018    1:54 PM  Hepatic Function  Total Protein 6.5 - 8.1 g/dL 6.7  6.9  6.8   Albumin 3.5 - 5.0 g/dL 3.3  4.3  4.1   AST 15 - 41 U/L 57  48  30   ALT 0 - 44 U/L 54  54  27   Alk Phosphatase 38 - 126 U/L 45  46  47   Total Bilirubin 0.3 - 1.2 mg/dL 0.8  0.6  0.5   Bilirubin, Direct 0.0 - 0.2 mg/dL <5.7  0.1  <8.4       STUDIES: No results found.    Impression / Plan:   Caleb Barnes is a 67 y.o. male with history of A-fib, tobacco use, alcohol use on Eliquis, cardiomyopathy EF of 20 to 25% with known history of food  impaction presented with recurrence of food impaction after eating chicken piece  Recommend urgent upper endoscopy for further evaluation and removal of food bolus Recommend to restart Prilosec 40 mg p.o. twice daily before meals Recommend to follow-up with Dr. Tobi Bastos as outpatient  I have discussed alternative options, risks & benefits,  which include, but are not limited to, bleeding, infection, perforation,respiratory complication & drug reaction.  The patient agrees with this plan & written consent will be obtained.     Thank you for involving me in the care of this patient.      LOS: 0 days   Lannette Donath, MD  04/08/2023, 4:46 PM    Note: This dictation was prepared with Dragon dictation along with smaller phrase technology. Any transcriptional errors that result from this process are unintentional.    Beautiful wall    Molli Knock

## 2023-04-08 NOTE — Transfer of Care (Signed)
Immediate Anesthesia Transfer of Care Note  Patient: Caleb Barnes  Procedure(s) Performed: ESOPHAGOGASTRODUODENOSCOPY (EGD) WITH PROPOFOL  Patient Location: Endoscopy Unit  Anesthesia Type:General  Level of Consciousness: awake  Airway & Oxygen Therapy: Patient Spontanous Breathing and Patient connected to face mask oxygen  Post-op Assessment: Report given to RN and Post -op Vital signs reviewed and stable  Post vital signs: Reviewed and stable  Last Vitals:  Vitals Value Taken Time  BP 114/76   Temp    Pulse 96 04/08/23 1635  Resp 16   SpO2 100 % 04/08/23 1635  Vitals shown include unvalidated device data.  Last Pain:  Vitals:   04/08/23 1454  TempSrc: Temporal  PainSc:          Complications: No notable events documented.

## 2023-04-08 NOTE — Anesthesia Postprocedure Evaluation (Signed)
Anesthesia Post Note  Patient: Caleb Barnes  Procedure(s) Performed: ESOPHAGOGASTRODUODENOSCOPY (EGD) WITH PROPOFOL  Patient location during evaluation: Endoscopy Anesthesia Type: General Level of consciousness: awake and alert Pain management: pain level controlled Vital Signs Assessment: post-procedure vital signs reviewed and stable Respiratory status: spontaneous breathing, nonlabored ventilation, respiratory function stable and patient connected to nasal cannula oxygen Cardiovascular status: blood pressure returned to baseline and stable Postop Assessment: no apparent nausea or vomiting Anesthetic complications: no   No notable events documented.   Last Vitals:  Vitals:   04/08/23 1645 04/08/23 1649  BP: 124/74   Pulse: 86 91  Resp: 19 (!) 23  Temp:    SpO2: 96% 95%    Last Pain:  Vitals:   04/08/23 1649  TempSrc:   PainSc: 0-No pain                 Cleda Mccreedy Kirsten Spearing

## 2023-04-08 NOTE — Anesthesia Procedure Notes (Signed)
Procedure Name: Intubation Date/Time: 04/08/2023 4:15 PM  Performed by: Irving Burton, CRNAPre-anesthesia Checklist: Patient identified, Emergency Drugs available, Suction available and Patient being monitored Patient Re-evaluated:Patient Re-evaluated prior to induction Oxygen Delivery Method: Circle system utilized Preoxygenation: Pre-oxygenation with 100% oxygen Induction Type: IV induction Ventilation: Mask ventilation without difficulty and Mask ventilation throughout procedure Laryngoscope Size: McGraph and 4 Grade View: Grade I Tube type: Oral Tube size: 7.0 mm Number of attempts: 1 Airway Equipment and Method: Stylet and Video-laryngoscopy Placement Confirmation: positive ETCO2, ETT inserted through vocal cords under direct vision and breath sounds checked- equal and bilateral Secured at: 22 cm Tube secured with: Tape Dental Injury: Teeth and Oropharynx as per pre-operative assessment

## 2023-04-08 NOTE — ED Provider Notes (Signed)
[facility] Provider Note    Event Date/Time   First MD Initiated Contact with Patient 04/08/23 1108     (approximate)   History   No chief complaint on file.   HPI  Caleb Barnes is a 67 y.o. male presents to the ED with complaint of sensation of food in his throat.  Patient states he was eating chicken last evening and felt that when he swallowed that it did not go down.  Patient reports trying to eat and drink this morning with emesis.  He has a history of food bolus in the past requiring endoscopy.  He also has history of A-fib, CHF, atrial flutter, elevated LFTs, alcohol and tobacco abuse.     Physical Exam   Triage Vital Signs: ED Triage Vitals  Enc Vitals Group     BP 04/08/23 1056 127/61     Pulse Rate 04/08/23 1056 (!) 50     Resp 04/08/23 1056 18     Temp 04/08/23 1056 98.4 F (36.9 C)     Temp Source 04/08/23 1056 Oral     SpO2 04/08/23 1056 100 %     Weight 04/08/23 1054 170 lb (77.1 kg)     Height 04/08/23 1054 5\' 10"  (1.778 m)     Head Circumference --      Peak Flow --      Pain Score 04/08/23 1054 0     Pain Loc --      Pain Edu? --      Excl. in GC? --     Most recent vital signs: Vitals:   04/08/23 1056  BP: 127/61  Pulse: (!) 50  Resp: 18  Temp: 98.4 F (36.9 C)  SpO2: 100%     General: Awake, no distress.  Talkative. CV:  Good peripheral perfusion.  Resp:  Normal effort.  Lungs are clear. Abd:  No distention.  Other:     ED Results / Procedures / Treatments   Labs (all labs ordered are listed, but only abnormal results are displayed) Labs Reviewed - No data to display    PROCEDURES:  Critical Care performed:   Procedures   MEDICATIONS ORDERED IN ED: Medications  0.9 %  sodium chloride infusion (has no administration in time range)  glucagon (human recombinant) (GLUCAGEN) injection 1 mg (1 mg Intravenous Given 04/08/23 1156)     IMPRESSION / MDM / ASSESSMENT AND PLAN / ED COURSE  I reviewed  the triage vital signs and the nursing notes.   Differential diagnosis includes, but is not limited to, esophageal fracture, esophageal obstruction secondary to food bolus   67 year old male presents to the ED with food bolus while eating chicken last evening.  Patient has a history of the same.  IV glucagon was given without success and patient also tried sipping on some soda which also did not resolve his symptoms.  Dr. Allegra Lai was notified and will be taken patient to endoscopy this afternoon.  Patient is aware.     Patient's presentation is most consistent with acute illness / injury with system symptoms.  FINAL CLINICAL IMPRESSION(S) / ED DIAGNOSES   Final diagnoses:  Esophageal obstruction due to food impaction     Rx / DC Orders   ED Discharge Orders     None        Note:  This document was prepared using Dragon voice recognition software and may include unintentional dictation errors.   Tommi Rumps, PA-C 04/08/23 1429  Georga Hacking, MD 04/09/23 [phone]

## 2023-04-08 NOTE — ED Triage Notes (Signed)
Patient was eating dinner last night and feels like chicken is stuck. Reports emesis this AM after trying to eat and drink some items

## 2023-04-09 ENCOUNTER — Encounter: Payer: Self-pay | Admitting: Gastroenterology

## 2023-04-10 ENCOUNTER — Telehealth: Payer: Self-pay

## 2023-04-10 DIAGNOSIS — K224 Dyskinesia of esophagus: Secondary | ICD-10-CM

## 2023-04-10 NOTE — Telephone Encounter (Signed)
Per the EGD report we are needing to get a Esophageal Manometry for patient. Do you want to refer to [facility] or [facility]

## 2023-04-12 ENCOUNTER — Other Ambulatory Visit (HOSPITAL_COMMUNITY): Payer: Self-pay | Admitting: Cardiology

## 2023-04-13 NOTE — Telephone Encounter (Signed)
He is a patient of Dr. Johnney Killian.  He should have a follow-up with Dr. Tobi Bastos However, he can be referred to [facility] or [place] for esophageal manometry, per patient's preference  RV

## 2023-04-14 ENCOUNTER — Telehealth: Payer: Self-pay | Admitting: Gastroenterology

## 2023-04-14 NOTE — Telephone Encounter (Signed)
Beth,  Referral in WQ from [facility] for a esophageal manometry for esophageal dysmotility. (Records in [facility]).  Please review.  Thanks [employer]

## 2023-04-14 NOTE — Addendum Note (Signed)
Addended by: Adela Ports on: 04/14/2023 10:04 AM   Modules accepted: Orders

## 2023-04-14 NOTE — Telephone Encounter (Signed)
Dr. Tobi Bastos, do you want me to send a referral to Dr. Meridee Score for the esophageal manometry? Please let me know.

## 2023-04-14 NOTE — Telephone Encounter (Signed)
Can check if they do it , if they [facility] or [facility]

## 2023-04-16 ENCOUNTER — Telehealth (HOSPITAL_COMMUNITY): Payer: Self-pay

## 2023-04-16 NOTE — Telephone Encounter (Signed)
Patient states co-pay card for Eliquis has expired. Can we set up other assistance for him?

## 2023-04-17 ENCOUNTER — Other Ambulatory Visit (HOSPITAL_COMMUNITY): Payer: Self-pay

## 2023-04-17 ENCOUNTER — Telehealth (HOSPITAL_COMMUNITY): Payer: Self-pay | Admitting: Pharmacy Technician

## 2023-04-17 NOTE — Telephone Encounter (Signed)
Advanced Heart Failure Patient Advocate Encounter  Patient stated his Eliquis co-pay card expired. Called and spoke with the patient. Stated he received a letter in the mail that the funds were running out on his co-pay card. Advised the patient to call the number for the co-pay card. He stated that he did and did not have the ID number so the automated system did not help him. Provided the ID number from the co-pay card we had previously on file. I advised the patient that if this was not the right ID, he would need to talk to a live representative and ask for assistance with the co-pay card.   He called with the information provided and was told the card was active and should be fine to use at the pharmacy. Advised him to call back if he runs into other issues.  Archer Asa, CPhT

## 2023-04-21 NOTE — Telephone Encounter (Signed)
Spoke with the patient. He reports he is doing better on omeprazole. Declines to schedule the esophageal manometry. Encouraged to contact his primary GI provider to discuss. Referral will be closed at this time.

## 2023-04-27 ENCOUNTER — Other Ambulatory Visit (HOSPITAL_COMMUNITY): Payer: Self-pay | Admitting: Cardiology

## 2023-04-27 ENCOUNTER — Encounter (HOSPITAL_COMMUNITY): Payer: Self-pay

## 2023-04-27 ENCOUNTER — Other Ambulatory Visit (HOSPITAL_COMMUNITY): Payer: Self-pay

## 2023-04-27 MED ORDER — APIXABAN 5 MG PO TABS
5.0000 mg | ORAL_TABLET | Freq: Two times a day (BID) | ORAL | 1 refills | Status: DC
  Filled 2023-04-27: qty 60, 30d supply, fill #0
  Filled 2023-05-28: qty 60, 30d supply, fill #1

## 2023-04-28 ENCOUNTER — Other Ambulatory Visit (HOSPITAL_COMMUNITY): Payer: Self-pay

## 2023-05-28 ENCOUNTER — Other Ambulatory Visit (HOSPITAL_COMMUNITY): Payer: Self-pay

## 2023-06-30 ENCOUNTER — Other Ambulatory Visit (HOSPITAL_COMMUNITY): Payer: Self-pay | Admitting: Cardiology

## 2023-06-30 ENCOUNTER — Other Ambulatory Visit: Payer: Self-pay

## 2023-06-30 MED ORDER — APIXABAN 5 MG PO TABS
5.0000 mg | ORAL_TABLET | Freq: Two times a day (BID) | ORAL | 1 refills | Status: DC
  Filled 2023-06-30: qty 60, 30d supply, fill #0
  Filled 2023-07-28: qty 60, 30d supply, fill #1

## 2023-07-04 ENCOUNTER — Other Ambulatory Visit: Payer: Self-pay | Admitting: Gastroenterology

## 2023-07-04 DIAGNOSIS — R1319 Other dysphagia: Secondary | ICD-10-CM

## 2023-07-15 ENCOUNTER — Other Ambulatory Visit (HOSPITAL_COMMUNITY): Payer: Self-pay | Admitting: Cardiology

## 2023-07-28 ENCOUNTER — Other Ambulatory Visit (HOSPITAL_COMMUNITY): Payer: Self-pay

## 2023-08-28 ENCOUNTER — Other Ambulatory Visit (HOSPITAL_COMMUNITY): Payer: Self-pay | Admitting: Cardiology

## 2023-08-28 ENCOUNTER — Other Ambulatory Visit (HOSPITAL_COMMUNITY): Payer: Self-pay

## 2023-08-28 MED ORDER — APIXABAN 5 MG PO TABS
5.0000 mg | ORAL_TABLET | Freq: Two times a day (BID) | ORAL | 1 refills | Status: DC
  Filled 2023-08-28: qty 60, 30d supply, fill #0
  Filled 2023-09-28: qty 60, 30d supply, fill #1

## 2023-08-29 ENCOUNTER — Other Ambulatory Visit (HOSPITAL_BASED_OUTPATIENT_CLINIC_OR_DEPARTMENT_OTHER): Payer: Self-pay

## 2023-09-28 ENCOUNTER — Other Ambulatory Visit (HOSPITAL_COMMUNITY): Payer: Self-pay

## 2023-10-14 ENCOUNTER — Other Ambulatory Visit (HOSPITAL_COMMUNITY): Payer: Self-pay | Admitting: Cardiology

## 2023-11-04 ENCOUNTER — Other Ambulatory Visit (HOSPITAL_COMMUNITY): Payer: Self-pay | Admitting: Cardiology

## 2023-11-04 ENCOUNTER — Other Ambulatory Visit: Payer: Self-pay

## 2023-11-04 ENCOUNTER — Other Ambulatory Visit (HOSPITAL_COMMUNITY): Payer: Self-pay

## 2023-11-04 MED ORDER — APIXABAN 5 MG PO TABS
5.0000 mg | ORAL_TABLET | Freq: Two times a day (BID) | ORAL | 1 refills | Status: DC
  Filled 2023-11-04: qty 60, 30d supply, fill #0
  Filled 2023-12-03 – 2023-12-07 (×2): qty 60, 30d supply, fill #1

## 2023-11-05 ENCOUNTER — Other Ambulatory Visit (HOSPITAL_COMMUNITY): Payer: Self-pay

## 2023-11-10 ENCOUNTER — Other Ambulatory Visit (HOSPITAL_COMMUNITY): Payer: Self-pay | Admitting: Cardiology

## 2023-11-17 ENCOUNTER — Telehealth (HOSPITAL_COMMUNITY): Payer: Self-pay | Admitting: Pharmacy Technician

## 2023-11-17 ENCOUNTER — Other Ambulatory Visit (HOSPITAL_COMMUNITY): Payer: Self-pay

## 2023-11-17 NOTE — Telephone Encounter (Signed)
Advanced Heart Failure Patient Advocate Encounter  Received renewal application for Entresto assistance from patient. Called and spoke with the patient. [employer] has made some changes and they no longer cover commercially insured patient. He is aware to call me when he needs a refill of Entresto next year. We will get it sent over to [facility] pharmacy for mail to ensure his co-pay card still works.  Archer Asa, CPhT

## 2023-11-24 ENCOUNTER — Other Ambulatory Visit (HOSPITAL_COMMUNITY): Payer: Self-pay | Admitting: Cardiology

## 2023-12-03 ENCOUNTER — Other Ambulatory Visit (HOSPITAL_COMMUNITY): Payer: Self-pay

## 2023-12-07 ENCOUNTER — Other Ambulatory Visit: Payer: Self-pay

## 2023-12-07 ENCOUNTER — Other Ambulatory Visit (HOSPITAL_COMMUNITY): Payer: Self-pay

## 2023-12-09 ENCOUNTER — Other Ambulatory Visit (HOSPITAL_COMMUNITY): Payer: Self-pay | Admitting: *Deleted

## 2023-12-09 ENCOUNTER — Ambulatory Visit (HOSPITAL_COMMUNITY)
Admission: RE | Admit: 2023-12-09 | Discharge: 2023-12-09 | Disposition: A | Discharge status: Home / Self Care | Payer: Managed Care, Other (non HMO) | Source: Ambulatory Visit | Attending: Cardiology | Admitting: Cardiology

## 2023-12-09 ENCOUNTER — Encounter (HOSPITAL_COMMUNITY): Payer: Self-pay | Admitting: Cardiology

## 2023-12-09 ENCOUNTER — Other Ambulatory Visit (HOSPITAL_COMMUNITY): Payer: Self-pay

## 2023-12-09 VITALS — BP 148/78 | HR 89 | Wt 176.8 lb

## 2023-12-09 DIAGNOSIS — Z87891 Personal history of nicotine dependence: Secondary | ICD-10-CM | POA: Insufficient documentation

## 2023-12-09 DIAGNOSIS — I5022 Chronic systolic (congestive) heart failure: Secondary | ICD-10-CM | POA: Insufficient documentation

## 2023-12-09 DIAGNOSIS — I4819 Other persistent atrial fibrillation: Secondary | ICD-10-CM | POA: Diagnosis not present

## 2023-12-09 DIAGNOSIS — Z79899 Other long term (current) drug therapy: Secondary | ICD-10-CM | POA: Insufficient documentation

## 2023-12-09 DIAGNOSIS — E785 Hyperlipidemia, unspecified: Secondary | ICD-10-CM | POA: Insufficient documentation

## 2023-12-09 DIAGNOSIS — I428 Other cardiomyopathies: Secondary | ICD-10-CM | POA: Insufficient documentation

## 2023-12-09 DIAGNOSIS — I2510 Atherosclerotic heart disease of native coronary artery without angina pectoris: Secondary | ICD-10-CM | POA: Insufficient documentation

## 2023-12-09 DIAGNOSIS — I480 Paroxysmal atrial fibrillation: Secondary | ICD-10-CM

## 2023-12-09 DIAGNOSIS — I4891 Unspecified atrial fibrillation: Secondary | ICD-10-CM | POA: Diagnosis not present

## 2023-12-09 DIAGNOSIS — Z7901 Long term (current) use of anticoagulants: Secondary | ICD-10-CM | POA: Insufficient documentation

## 2023-12-09 DIAGNOSIS — R0602 Shortness of breath: Secondary | ICD-10-CM | POA: Diagnosis present

## 2023-12-09 LAB — LIPID PANEL
Cholesterol: 170 mg/dL (ref 0–200)
HDL: 38 mg/dL — ABNORMAL LOW (ref >40)
LDL Cholesterol: 96 mg/dL (ref 0–99)
Total CHOL/HDL Ratio: 4.5 {ratio}
Triglycerides: 180 mg/dL — ABNORMAL HIGH (ref <150)
VLDL: 36 mg/dL (ref 0–40)

## 2023-12-09 LAB — BASIC METABOLIC PANEL
Anion gap: 5 (ref 5–15)
BUN: 15 mg/dL (ref 8–23)
CO2: 27 mmol/L (ref 22–32)
Calcium: 9.3 mg/dL (ref 8.9–10.3)
Chloride: 107 mmol/L (ref 98–111)
Creatinine, Ser: 1.33 mg/dL — ABNORMAL HIGH (ref 0.61–1.24)
GFR, Estimated: 59 mL/min — ABNORMAL LOW (ref >60)
Glucose, Bld: 128 mg/dL — ABNORMAL HIGH (ref 70–99)
Potassium: 4.9 mmol/L (ref 3.5–5.1)
Sodium: 139 mmol/L (ref 135–145)

## 2023-12-09 LAB — TSH: TSH: 1.519 u[IU]/mL (ref 0.350–4.500)

## 2023-12-09 LAB — CBC
HCT: 41 % (ref 39.0–52.0)
Hemoglobin: 13.8 g/dL (ref 13.0–17.0)
MCH: 32.2 pg (ref 26.0–34.0)
MCHC: 33.7 g/dL (ref 30.0–36.0)
MCV: 95.8 fL (ref 80.0–100.0)
Platelets: 236 10*3/uL (ref 150–400)
RBC: 4.28 MIL/uL (ref 4.22–5.81)
RDW: 13.2 % (ref 11.5–15.5)
WBC: 5.8 10*3/uL (ref 4.0–10.5)
nRBC: 0 % (ref 0.0–0.2)

## 2023-12-09 MED ORDER — ROSUVASTATIN CALCIUM 10 MG PO TABS: 10.0000 mg | ORAL_TABLET | Freq: Every day | ORAL | 3 refills | Status: DC

## 2023-12-09 MED ORDER — APIXABAN 5 MG PO TABS: 5.0000 mg | ORAL_TABLET | Freq: Two times a day (BID) | ORAL | 3 refills | Status: DC

## 2023-12-09 MED ORDER — METOPROLOL SUCCINATE ER 50 MG PO TB24: 75.0000 mg | ORAL_TABLET | Freq: Every day | ORAL | 3 refills | Status: DC

## 2023-12-09 NOTE — Patient Instructions (Signed)
Medication Changes:  Increase Metoprolol XL to 75 mg (1 & 1/2 tabs) Daily  Lab Work:  Labs done today, your results will be available in MyChart, we will contact you for abnormal readings.   Testing/Procedures:  Your physician has recommended that you have a Cardioversion (DCCV). Electrical Cardioversion uses a jolt of electricity to your heart either through paddles or wired patches attached to your chest. This is a controlled, usually prescheduled, procedure. Defibrillation is done under light anesthesia in the hospital, and you usually go home the day of the procedure. This is done to get your heart back into a normal rhythm. You are not awake for the procedure. Please see the instructions below  Your physician has requested that you have an echocardiogram. Echocardiography is a painless test that uses sound waves to create images of your heart. It provides your doctor with information about the size and shape of your heart and how well your heart's chambers and valves are working. This procedure takes approximately one hour. There are no restrictions for this procedure. Please do NOT wear cologne, perfume, aftershave, or lotions (deodorant is allowed). Please arrive 15 minutes prior to your appointment time.  Please note: We ask at that you not bring children with you during ultrasound (echo/ vascular) testing. Due to room size and safety concerns, children are not allowed in the ultrasound rooms during exams. Our front office staff cannot provide observation of children in our lobby area while testing is being conducted. An adult accompanying a patient to their appointment will only be allowed in the ultrasound room at the discretion of the ultrasound technician under special circumstances. We apologize for any inconvenience.  Referrals:  You have been referred to Dr Elberta Fortis to discuss possible afib ablation  Special Instructions // Education:  Do the following things EVERYDAY: Weigh  yourself in the morning before breakfast. Write it down and keep it in a log. Take your medicines as prescribed Eat low salt foods--Limit salt (sodium) to 2000 mg per day.  Stay as active as you can everyday Limit all fluids for the day to less than 2 liters      CARDIOVERSION INSTRUCTIONS:  You are scheduled for a Cardioversion on Tuesday, December 17 with Dr. Shirlee Latch.    Please arrive at the [facility] (Main Entrance A) at [facility]: [address] [place], [place] [address] at 12:00 PM (This time is 1 hour(s) before your procedure to ensure your preparation).   Free valet parking service is available. You will check in at ADMITTING.   *Please Note: You will receive a call the day before your procedure to confirm the appointment time. That time may have changed from the original time based on the schedule for that day.*   DIET:  Nothing to eat or drink after midnight except a sip of water with medications (see medication instructions below)  MEDICATION INSTRUCTIONS: !!IF ANY NEW MEDICATIONS ARE STARTED AFTER TODAY, PLEASE NOTIFY YOUR PROVIDER AS SOON AS POSSIBLE!!  FYI: Medications such as Semaglutide (Ozempic, [place]), Tirzepatide (Mounjaro, Zepbound), Dulaglutide (Trulicity), etc ("GLP1 agonists") AND Canagliflozin (Invokana), Dapagliflozin (Farxiga), Empagliflozin (Jardiance), Ertugliflozin (Steglatro), Bexagliflozin [employer]) or any combination with one of these drugs such as Invokamet (Canagliflozin/Metformin), Synjardy (Empagliflozin/Metformin), etc ("SGLT2 inhibitors") must be held around the time of a procedure. This is not a comprehensive list of all of these drugs. Please review all of your medications and talk to your provider if you take any one of these. If you are not sure,  ask your provider.         :1}Continue taking your anticoagulant (blood thinner): Apixaban (Eliquis). PLEASE DO NOT MISS ANY DOSES  LABS: DONE TODAY  FYI:  For your safety, and to allow  [place] to monitor your vital signs accurately during the surgery/procedure we request: If you have artificial nails, gel coating, SNS etc, please have those removed prior to your surgery/procedure. Not having the nail coverings /polish removed may result in cancellation or delay of your surgery/procedure.  Your support person will be asked to wait in the waiting room during your procedure.  It is OK to have someone drop you off and come back when you are ready to be discharged.  You cannot drive after the procedure and will need someone to drive you home.  Bring your insurance cards.  *Special Note: Every effort is made to have your procedure done on time. Occasionally there are emergencies that occur at the hospital that may cause delays. Please be patient if a delay does occur.    Follow-Up in: 6 weeks  At the [facility], you and your health needs are our priority. We have a designated team specialized in the treatment of Heart Failure. This Care Team includes your primary Heart Failure Specialized Cardiologist (physician), Advanced Practice Providers (APPs- Physician Assistants and Nurse Practitioners), and Pharmacist who all work together to provide you with the care you need, when you need it.   You may see any of the following providers on your designated Care Team at your next follow up:  Dr. Arvilla Meres Dr. [name] Dr. Dorthula Nettles Dr. Theresia Bough Tonye Becket, NP Robbie Lis, [place] [facility], [place] Brynda Peon, NP [place] Lee, NP Karle Plumber, PharmD   Please be sure to bring in all your medications bottles to every appointment.   Need to Contact us:  If you have any questions or concerns before your next appointment please send [place] a message through [facility] or call our office at [phone].    TO LEAVE A MESSAGE FOR THE NURSE SELECT OPTION 2, PLEASE LEAVE A MESSAGE INCLUDING: YOUR NAME DATE OF BIRTH CALL BACK NUMBER REASON  FOR CALL**this is important as we prioritize the call backs  YOU WILL RECEIVE A CALL BACK THE SAME DAY AS LONG AS YOU CALL BEFORE 4:00 PM

## 2023-12-09 NOTE — Progress Notes (Signed)
Patient ID: Caleb Barnes, male   DOB: [birth year], 67 y.o.   MRN: [id number]    Advanced Heart Failure Clinic Note   HF Cardiology: Dr. Shirlee Latch   HPI:  Caleb Barnes is a 67 y.o. male with h/o tobacco abuse and ETOH abuse who presented to [facility] ED on 03/31/16 with SOB. BNP and LFTs were elevated. Was found to be in atrial fibrillation with RVR. Echo 03/31/16 with reduced EF to 20-25%, mild MR, mild/mod reduced RV systolic function, Peak PA systolic 43 mm Hg.  CTA chest with no PE. He was also placed on CIWA protocol for extensive ETOH use as an outpatient. GI consulted with elevated LFTs at [facility]. Abd [place] with gallbladder thickening but normal-appearing liver. He was started on amiodarone drip for atrial fibrillation. Diuresed with IV lasix 40 mg BID.  He was started on Dobutamine and Dopamine drips. Was transferred to [facility] for further evaluation and treatment with worsening condition.   He was weaned off both dopamine and dobutamine in CCU and went back into NSR.  [facility] 04/04/16 showed low CO, mildly elevated filling pressures, and mild nonobstructive CAD. Started on milrinone with low cardiac output. 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  He tolerated milrinone wean and was discharged to home without inotropic support and in NSR.  Repeat echo in 8/17 showed EF up to 55-60%. Echo 8/18 showed EF 50-55%.    Echo in 7/19 showed EF 55-60%.  Echo in 10/21 showed EF 60-65%, normal.   He presents today for followup of CHF.  He is not smoking and rarely drinks ETOH.  Weight up 3 lbs.  He is back in atrial fibrillation with controlled rate today.  He does not notice it and is not sure how long he has been out of rhythm.  He is short of breath with heavy exertion such as carrying full boxes at work.  He is working full time.  No lightheadedness.  No chest pain.  No orthopnea/PND.  He has not missed any apixaban doses.    ECG: personally reviewed. Atrial  fibrillation  Labs (4/17): HCT 46.1 Labs (5/17): K 4.3, creatinine 0.98, LFTs normal Labs (7/17): K 4.4, creatinine 1.05 Labs (2/18): K 4.4, creatinine 1.5, LDL 137, hgb 12.7 Labs (6/18): TSH normal Labs (9/18): K 4.1, creatinine 1.09 Labs (1/19): K 3.6, creatinine 1.05, hgb 13.5.  Labs (7/19): LDL 136, K 4, creatinine 1.22 Labs (3/22): K 4.1, creatinine 1.2 Labs (1/23): LDL 44 Labs (11/23): K 4.2, creatinine 1.44  PMH 1. Chronic systolic CHF: Nonischemic cardiomyopathy.  Possible etiologies include tachy-mediated CMP with atrial fib/RVR of unknown duration and ETOH CMP.  - R/LHC (4/17): Mild nonobstructive CAD; mean RA 7, PA 43/25, mean PCWP 21, CI 1.41 Fick and 1.78 thermodilution.  - Echo (4/17): EF < 20%, moderate LV dilation, mid to moderately decreased RV systolic function.  - Echo (8/17): EF 55-60%, moderate LVH, normal RV size and systolic function.  - Echo (8/18): EF 50-55%, PASP 28 mmHg - Echo (7/19): EF 55-60%, normal.  - Echo (7/21): EF 60-65%, normal 2. Atrial fibrillation: Paroxysmal. 3. ETOH abuse: Has quit.  4. COPD  Current Outpatient Medications  Medication Sig Dispense Refill   colchicine 0.6 MG tablet Take 0.6 mg by mouth.     escitalopram (LEXAPRO) 10 MG tablet Take 1 tablet (10 mg total) by mouth daily. 30 tablet 2   omeprazole (PRILOSEC) 40 MG capsule TAKE 1 CAPSULE (40  MG TOTAL) BY MOUTH 2 (TWO) TIMES DAILY BEFORE A MEAL. 180 capsule 3   sacubitril-valsartan (ENTRESTO) 49-51 MG Take 1 tablet by mouth 2 (two) times daily. 180 tablet 3   spironolactone (ALDACTONE) 25 MG tablet Take 0.5 tablets (12.5 mg total) by mouth daily. Please call for office visit [phone] 45 tablet 3   apixaban (ELIQUIS) 5 MG TABS tablet Take 1 tablet (5 mg total) by mouth 2 (two) times daily. 180 tablet 3   ibuprofen (ADVIL) 200 MG tablet Take 400 mg by mouth every 6 (six) hours as needed for moderate pain (pain score 4-6).     metoprolol succinate (TOPROL-XL) 50 MG 24 hr tablet  Take 1.5 tablets (75 mg total) by mouth daily. 135 tablet 3   naproxen sodium (ALEVE) 220 MG tablet Take 220 mg by mouth daily as needed (pain).     rosuvastatin (CRESTOR) 10 MG tablet Take 1 tablet (10 mg total) by mouth daily. 90 tablet 3   No current facility-administered medications for this encounter.    Allergies  Allergen Reactions   Bee Venom Swelling    redness      Social History   Socioeconomic History   Marital status: Divorced    Spouse name: Not on file   Number of children: Not on file   Years of education: Not on file   Highest education level: Not on file  Occupational History   Not on file  Tobacco Use   Smoking status: Former    Current packs/day: 0.00    Average packs/day: 2.0 packs/day for 50.0 years (100.0 ttl pk-yrs)    Types: Cigarettes    Start date: 52    Quit date: 2014    Years since quitting: 10.9   Smokeless tobacco: Never  Vaping Use   Vaping status: Never Used  Substance and Sexual Activity   Alcohol use: Yes    Alcohol/week: 3.0 standard drinks of alcohol    Types: 3 Cans of beer per week   Drug use: Yes    Frequency: 2.0 times per week    Types: Marijuana   Sexual activity: Not on file  Other Topics Concern   Not on file  Social History Narrative   Not on file   Social Determinants of Health   Financial Resource Strain: Not on file  Food Insecurity: Not on file  Transportation Needs: Not on file  Physical Activity: Not on file  Stress: Not on file  Social Connections: Not on file  Intimate Partner Violence: Not on file      Family History  Problem Relation Age of Onset   CAD Father    Depression Father    COPD Mother    Hypertension Mother    Review of systems complete and found to be negative unless listed in HPI.   Vitals:   12/09/23 0844  BP: (!) 148/78  Pulse: 89  SpO2: 96%  Weight: 80.2 kg (176 lb 12.8 oz)   Wt Readings from Last 3 Encounters:  12/09/23 80.2 kg (176 lb 12.8 oz)  04/08/23 77.1 kg  (170 lb)  01/28/23 79.4 kg (175 lb)     PHYSICAL EXAM: General: NAD Neck: No JVD, no thyromegaly or thyroid nodule.  Lungs: Clear to auscultation bilaterally with normal respiratory effort. CV: Nondisplaced PMI.  Heart irregular S1/S2, no S3/S4, no murmur.  No peripheral edema.  No carotid bruit.  Normal pedal pulses.  Abdomen: Soft, nontender, no hepatosplenomegaly, no distention.  Skin: Intact without lesions or  rashes.  Neurologic: Alert and oriented x 3.  Psych: Normal affect. Extremities: No clubbing or cyanosis.  HEENT: Normal.   ASSESSMENT & PLAN:  1. Chronic systolic CHF: Nonischemic cardiomyopathy.  I suspect that this was a combination of tachy-mediated CMP + ETOH CMP.  He has quit drinking.  Echo in 8/18 showed EF 50-55%.  Echo in 7/19 and again in 10/21 showed EF 55-60%.  Currently NYHA class I-II.  He is not volume overloaded on exam.  He has gone back into atrial fibrillation.  Given prior history of tachycardia-mediated CMP, I think we need to get him back into NSR.  - Continue current Entresto and spironolactone.   - Increase Toprol XL to 75 mg daily.  - Check BMET/BNP today and will need BMET every 3 months.  - I will arrange for repeat echo.  2. Atrial fibrillation: Persistent.  He is in atrial fibrillation today, unsure how long. Given prior history of suspected tachycardia-mediated CMP, need to get him back into NSR.  - Continue apixaban. Has not missed doses.  - CBC, TSH today.  - I will arrange for DCCV to get him back into NSR.  Discussed risks/benefits and he agrees to procedure.  - I will refer to EP for evaluation for ablation rather than starting him back on amiodarone.  3. Hyperlipidemia: Continue Crestor. - Check lipids today.   Followup in 6 wks.   [name] 12/09/2023

## 2023-12-09 NOTE — H&P (View-Only) (Signed)
 Patient ID: Caleb Barnes, male   DOB: [birth year], 67 y.o.   MRN: [id number]    Advanced Heart Failure Clinic Note   HF Cardiology: Dr. Shirlee Latch   HPI:  Caleb Barnes is a 67 y.o. male with h/o tobacco abuse and ETOH abuse who presented to [facility] ED on 03/31/16 with SOB. BNP and LFTs were elevated. Was found to be in atrial fibrillation with RVR. Echo 03/31/16 with reduced EF to 20-25%, mild MR, mild/mod reduced RV systolic function, Peak PA systolic 43 mm Hg.  CTA chest with no PE. He was also placed on CIWA protocol for extensive ETOH use as an outpatient. GI consulted with elevated LFTs at [facility]. Abd [place] with gallbladder thickening but normal-appearing liver. He was started on amiodarone drip for atrial fibrillation. Diuresed with IV lasix 40 mg BID.  He was started on Dobutamine and Dopamine drips. Was transferred to [facility] for further evaluation and treatment with worsening condition.   He was weaned off both dopamine and dobutamine in CCU and went back into NSR.  [facility] 04/04/16 showed low CO, mildly elevated filling pressures, and mild nonobstructive CAD. Started on milrinone with low cardiac output. 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  He tolerated milrinone wean and was discharged to home without inotropic support and in NSR.  Repeat echo in 8/17 showed EF up to 55-60%. Echo 8/18 showed EF 50-55%.    Echo in 7/19 showed EF 55-60%.  Echo in 10/21 showed EF 60-65%, normal.   He presents today for followup of CHF.  He is not smoking and rarely drinks ETOH.  Weight up 3 lbs.  He is back in atrial fibrillation with controlled rate today.  He does not notice it and is not sure how long he has been out of rhythm.  He is short of breath with heavy exertion such as carrying full boxes at work.  He is working full time.  No lightheadedness.  No chest pain.  No orthopnea/PND.  He has not missed any apixaban doses.    ECG: personally reviewed. Atrial  fibrillation  Labs (4/17): HCT 46.1 Labs (5/17): K 4.3, creatinine 0.98, LFTs normal Labs (7/17): K 4.4, creatinine 1.05 Labs (2/18): K 4.4, creatinine 1.5, LDL 137, hgb 12.7 Labs (6/18): TSH normal Labs (9/18): K 4.1, creatinine 1.09 Labs (1/19): K 3.6, creatinine 1.05, hgb 13.5.  Labs (7/19): LDL 136, K 4, creatinine 1.22 Labs (3/22): K 4.1, creatinine 1.2 Labs (1/23): LDL 44 Labs (11/23): K 4.2, creatinine 1.44  PMH 1. Chronic systolic CHF: Nonischemic cardiomyopathy.  Possible etiologies include tachy-mediated CMP with atrial fib/RVR of unknown duration and ETOH CMP.  - R/LHC (4/17): Mild nonobstructive CAD; mean RA 7, PA 43/25, mean PCWP 21, CI 1.41 Fick and 1.78 thermodilution.  - Echo (4/17): EF < 20%, moderate LV dilation, mid to moderately decreased RV systolic function.  - Echo (8/17): EF 55-60%, moderate LVH, normal RV size and systolic function.  - Echo (8/18): EF 50-55%, PASP 28 mmHg - Echo (7/19): EF 55-60%, normal.  - Echo (7/21): EF 60-65%, normal 2. Atrial fibrillation: Paroxysmal. 3. ETOH abuse: Has quit.  4. COPD  Current Outpatient Medications  Medication Sig Dispense Refill   colchicine 0.6 MG tablet Take 0.6 mg by mouth.     escitalopram (LEXAPRO) 10 MG tablet Take 1 tablet (10 mg total) by mouth daily. 30 tablet 2   omeprazole (PRILOSEC) 40 MG capsule TAKE 1 CAPSULE (40  MG TOTAL) BY MOUTH 2 (TWO) TIMES DAILY BEFORE A MEAL. 180 capsule 3   sacubitril-valsartan (ENTRESTO) 49-51 MG Take 1 tablet by mouth 2 (two) times daily. 180 tablet 3   spironolactone (ALDACTONE) 25 MG tablet Take 0.5 tablets (12.5 mg total) by mouth daily. Please call for office visit [phone] 45 tablet 3   apixaban (ELIQUIS) 5 MG TABS tablet Take 1 tablet (5 mg total) by mouth 2 (two) times daily. 180 tablet 3   ibuprofen (ADVIL) 200 MG tablet Take 400 mg by mouth every 6 (six) hours as needed for moderate pain (pain score 4-6).     metoprolol succinate (TOPROL-XL) 50 MG 24 hr tablet  Take 1.5 tablets (75 mg total) by mouth daily. 135 tablet 3   naproxen sodium (ALEVE) 220 MG tablet Take 220 mg by mouth daily as needed (pain).     rosuvastatin (CRESTOR) 10 MG tablet Take 1 tablet (10 mg total) by mouth daily. 90 tablet 3   No current facility-administered medications for this encounter.    Allergies  Allergen Reactions   Bee Venom Swelling    redness      Social History   Socioeconomic History   Marital status: Divorced    Spouse name: Not on file   Number of children: Not on file   Years of education: Not on file   Highest education level: Not on file  Occupational History   Not on file  Tobacco Use   Smoking status: Former    Current packs/day: 0.00    Average packs/day: 2.0 packs/day for 50.0 years (100.0 ttl pk-yrs)    Types: Cigarettes    Start date: 52    Quit date: 2014    Years since quitting: 10.9   Smokeless tobacco: Never  Vaping Use   Vaping status: Never Used  Substance and Sexual Activity   Alcohol use: Yes    Alcohol/week: 3.0 standard drinks of alcohol    Types: 3 Cans of beer per week   Drug use: Yes    Frequency: 2.0 times per week    Types: Marijuana   Sexual activity: Not on file  Other Topics Concern   Not on file  Social History Narrative   Not on file   Social Determinants of Health   Financial Resource Strain: Not on file  Food Insecurity: Not on file  Transportation Needs: Not on file  Physical Activity: Not on file  Stress: Not on file  Social Connections: Not on file  Intimate Partner Violence: Not on file      Family History  Problem Relation Age of Onset   CAD Father    Depression Father    COPD Mother    Hypertension Mother    Review of systems complete and found to be negative unless listed in HPI.   Vitals:   12/09/23 0844  BP: (!) 148/78  Pulse: 89  SpO2: 96%  Weight: 80.2 kg (176 lb 12.8 oz)   Wt Readings from Last 3 Encounters:  12/09/23 80.2 kg (176 lb 12.8 oz)  04/08/23 77.1 kg  (170 lb)  01/28/23 79.4 kg (175 lb)     PHYSICAL EXAM: General: NAD Neck: No JVD, no thyromegaly or thyroid nodule.  Lungs: Clear to auscultation bilaterally with normal respiratory effort. CV: Nondisplaced PMI.  Heart irregular S1/S2, no S3/S4, no murmur.  No peripheral edema.  No carotid bruit.  Normal pedal pulses.  Abdomen: Soft, nontender, no hepatosplenomegaly, no distention.  Skin: Intact without lesions or  rashes.  Neurologic: Alert and oriented x 3.  Psych: Normal affect. Extremities: No clubbing or cyanosis.  HEENT: Normal.   ASSESSMENT & PLAN:  1. Chronic systolic CHF: Nonischemic cardiomyopathy.  I suspect that this was a combination of tachy-mediated CMP + ETOH CMP.  He has quit drinking.  Echo in 8/18 showed EF 50-55%.  Echo in 7/19 and again in 10/21 showed EF 55-60%.  Currently NYHA class I-II.  He is not volume overloaded on exam.  He has gone back into atrial fibrillation.  Given prior history of tachycardia-mediated CMP, I think we need to get him back into NSR.  - Continue current Entresto and spironolactone.   - Increase Toprol XL to 75 mg daily.  - Check BMET/BNP today and will need BMET every 3 months.  - I will arrange for repeat echo.  2. Atrial fibrillation: Persistent.  He is in atrial fibrillation today, unsure how long. Given prior history of suspected tachycardia-mediated CMP, need to get him back into NSR.  - Continue apixaban. Has not missed doses.  - CBC, TSH today.  - I will arrange for DCCV to get him back into NSR.  Discussed risks/benefits and he agrees to procedure.  - I will refer to EP for evaluation for ablation rather than starting him back on amiodarone.  3. Hyperlipidemia: Continue Crestor. - Check lipids today.   Followup in 6 wks.   [name] 12/09/2023

## 2023-12-11 ENCOUNTER — Telehealth (HOSPITAL_COMMUNITY): Payer: Self-pay

## 2023-12-11 DIAGNOSIS — I5022 Chronic systolic (congestive) heart failure: Secondary | ICD-10-CM

## 2023-12-11 MED ORDER — ROSUVASTATIN CALCIUM 20 MG PO TABS: 20.0000 mg | ORAL_TABLET | Freq: Every day | ORAL | 3 refills | Status: DC

## 2023-12-11 NOTE — Telephone Encounter (Signed)
Spoke with patient regarding the following results. Patient made aware and patient verbalized understanding.   New dose of rosuvastatin sent to pharmacy.   Repeat labs ordered and scheduled.

## 2023-12-11 NOTE — Telephone Encounter (Signed)
-----   Message from [name] sent at 12/09/2023  4:39 PM EST ----- LDL too high, goal < 55, increase Crestor to 20 mg daily. Lipids/LFTs in 2 months.

## 2023-12-14 NOTE — Progress Notes (Signed)
 Spoke to pt and instructed them to come at 1145 and to be NPO after 0000.  Confirmed no missed doses of AC and instructed to take in AM with a small sip of water.  Confirmed that pt will have a ride home and someone to stay with them for 24 hours after the procedure. Instructed patient to not wear any jewelry or lotion.

## 2023-12-15 ENCOUNTER — Other Ambulatory Visit (HOSPITAL_COMMUNITY): Payer: Self-pay

## 2023-12-15 ENCOUNTER — Ambulatory Visit (HOSPITAL_COMMUNITY): Payer: Managed Care, Other (non HMO) | Admitting: Anesthesiology

## 2023-12-15 ENCOUNTER — Other Ambulatory Visit: Payer: Self-pay

## 2023-12-15 ENCOUNTER — Encounter (HOSPITAL_COMMUNITY)
Admission: RE | Disposition: A | Discharge status: Home / Self Care | Payer: Self-pay | Source: Home / Self Care | Attending: Cardiology

## 2023-12-15 ENCOUNTER — Ambulatory Visit (HOSPITAL_COMMUNITY)
Admission: RE | Admit: 2023-12-15 | Discharge: 2023-12-15 | Disposition: A | Discharge status: Home / Self Care | Payer: Managed Care, Other (non HMO) | Attending: Cardiology | Admitting: Cardiology

## 2023-12-15 DIAGNOSIS — I428 Other cardiomyopathies: Secondary | ICD-10-CM | POA: Diagnosis not present

## 2023-12-15 DIAGNOSIS — Z87891 Personal history of nicotine dependence: Secondary | ICD-10-CM | POA: Diagnosis not present

## 2023-12-15 DIAGNOSIS — Z79899 Other long term (current) drug therapy: Secondary | ICD-10-CM | POA: Insufficient documentation

## 2023-12-15 DIAGNOSIS — I4891 Unspecified atrial fibrillation: Secondary | ICD-10-CM

## 2023-12-15 DIAGNOSIS — I480 Paroxysmal atrial fibrillation: Secondary | ICD-10-CM

## 2023-12-15 DIAGNOSIS — Z7901 Long term (current) use of anticoagulants: Secondary | ICD-10-CM | POA: Diagnosis not present

## 2023-12-15 DIAGNOSIS — I4819 Other persistent atrial fibrillation: Secondary | ICD-10-CM | POA: Insufficient documentation

## 2023-12-15 DIAGNOSIS — E785 Hyperlipidemia, unspecified: Secondary | ICD-10-CM | POA: Insufficient documentation

## 2023-12-15 DIAGNOSIS — I5022 Chronic systolic (congestive) heart failure: Secondary | ICD-10-CM | POA: Diagnosis not present

## 2023-12-15 HISTORY — PX: CARDIOVERSION: EP1203

## 2023-12-15 SURGERY — CARDIOVERSION (CATH LAB)
Anesthesia: General

## 2023-12-15 MED ORDER — SODIUM CHLORIDE 0.9% FLUSH: 10.0000 mL | Freq: Two times a day (BID) | INTRAVENOUS | Status: DC

## 2023-12-15 MED ORDER — PROPOFOL 10 MG/ML IV BOLUS
INTRAVENOUS | Status: DC | PRN
  Administered 2023-12-15: 60 mg via INTRAVENOUS

## 2023-12-15 MED ORDER — ENTRESTO 49-51 MG PO TABS
1.0000 | ORAL_TABLET | Freq: Two times a day (BID) | ORAL | 3 refills | Status: DC
  Filled 2023-12-15: qty 60, 30d supply, fill #0

## 2023-12-15 SURGICAL SUPPLY — 1 items: PAD DEFIB RADIO PHYSIO CONN (PAD) ×1 IMPLANT

## 2023-12-15 NOTE — Discharge Instructions (Signed)

## 2023-12-15 NOTE — Procedures (Signed)
Electrical Cardioversion Procedure Note Mick Achter [id number] Jan 29, 1956  Procedure: Electrical Cardioversion Indications:  Atrial Fibrillation  Procedure Details Consent: Risks of procedure as well as the alternatives and risks of each were explained to the (patient/caregiver).  Consent for procedure obtained. Time Out: Verified patient identification, verified procedure, site/side was marked, verified correct patient position, special equipment/implants available, medications/allergies/relevent history reviewed, required imaging and test results available.  Performed  Patient placed on cardiac monitor, pulse oximetry, supplemental oxygen as necessary.  Sedation given: Propofol Pacer pads placed anterior and posterior chest.  Cardioverted 1 time(s).  Cardioverted at 200J.  Evaluation Findings: Post procedure EKG shows: NSR Complications: None Patient did tolerate procedure well.   [name] 12/15/2023, 2:02 PM

## 2023-12-15 NOTE — Anesthesia Preprocedure Evaluation (Addendum)
Anesthesia Evaluation  Patient identified by MRN, date of birth, ID band Patient awake    Reviewed: Allergy & Precautions, NPO status , Patient's Chart, lab work & pertinent test results, reviewed documented beta blocker date and time   History of Anesthesia Complications Negative for: history of anesthetic complications  Airway Mallampati: III  TM Distance: <3 FB Neck ROM: full    Dental no notable dental hx. (+) Upper Dentures, Lower Dentures   Pulmonary shortness of breath and with exertion, COPD, former smoker   Pulmonary exam normal breath sounds clear to auscultation       Cardiovascular Exercise Tolerance: Good (-) angina +CHF  (-) Past MI Normal cardiovascular exam+ dysrhythmias Atrial Fibrillation  Rhythm:Irregular Rate:Normal  Echo 2021  1. Left ventricular ejection fraction, by estimation, is 60 to 65%. The  left ventricle has normal function. The left ventricle has no regional  wall motion abnormalities. Left ventricular diastolic parameters were  normal.   2. Right ventricular systolic function is normal. The right ventricular  size is normal.   3. The mitral valve is normal in structure. No evidence of mitral valve  regurgitation. No evidence of mitral stenosis.   4. The aortic valve is normal in structure. Aortic valve regurgitation is  not visualized. No aortic stenosis is present.      Neuro/Psych   Anxiety     negative neurological ROS  negative psych ROS   GI/Hepatic Neg liver ROS,GERD  Controlled,,  Endo/Other  negative endocrine ROS    Renal/GU Renal disease     Musculoskeletal   Abdominal   Peds  Hematology negative hematology ROS (+)   Anesthesia Other Findings Food bolus  Past Medical History: No date: Acute renal failure No date: Anxiety No date: CHF (congestive heart failure) No date: COPD (chronic obstructive pulmonary disease) No date: Heart disease No date:  Hyperlipidemia No date: Polysubstance abuse No date: Shortness of breath dyspnea  Past Surgical History: 04/04/2016: CARDIAC CATHETERIZATION; N/A     Comment:  Procedure: Right/Left Heart Cath and Coronary               Angiography;  Surgeon: Laurey Morale, MD;  Location: [facility]              INVASIVE CV LAB;  Service: Cardiovascular;  Laterality:               N/A; 11/21/2022: ESOPHAGOGASTRODUODENOSCOPY; N/A     Comment:  Procedure: ESOPHAGOGASTRODUODENOSCOPY (EGD);  Surgeon:               Wyline Mood, MD;  Location: [facility] ENDOSCOPY;  Service:               Gastroenterology;  Laterality: N/A; 01/28/2023: ESOPHAGOGASTRODUODENOSCOPY (EGD) WITH PROPOFOL; N/A     Comment:  Procedure: ESOPHAGOGASTRODUODENOSCOPY (EGD) WITH               PROPOFOL;  Surgeon: Wyline Mood, MD;  Location: [facility]               ENDOSCOPY;  Service: Gastroenterology;  Laterality: N/A;  BMI    Body Mass Index: 24.39 kg/m      Reproductive/Obstetrics                             Anesthesia Physical Anesthesia Plan  ASA: 4  Anesthesia Plan: General   Post-op Pain Management: Minimal or no pain anticipated   Induction: Intravenous  PONV Risk Score and Plan:  Treatment may vary due to age or medical condition, Propofol infusion, TIVA and Ondansetron  Airway Management Planned: Natural Airway  Additional Equipment:   Intra-op Plan:   Post-operative Plan:   Informed Consent: I have reviewed the patients History and Physical, chart, labs and discussed the procedure including the risks, benefits and alternatives for the proposed anesthesia with the patient or authorized representative who has indicated his/her understanding and acceptance.     Dental advisory given  Plan Discussed with: CRNA  Anesthesia Plan Comments: (Risks of anesthesia explained at length. This includes, but is not limited to, sore throat, damage to teeth, lips gums, tongue and vocal cords, nausea and vomiting,  reactions to medications, stroke, heart attack, and death. All patient questions were answered and the patient wishes to proceed. )       Anesthesia Quick Evaluation

## 2023-12-15 NOTE — Transfer of Care (Signed)
Immediate Anesthesia Transfer of Care Note  Patient: Caleb Barnes  Procedure(s) Performed: CARDIOVERSION  Patient Location: Cath Lab  Anesthesia Type:General  Level of Consciousness: awake, alert , and oriented  Airway & Oxygen Therapy: Patient Spontanous Breathing and Patient connected to nasal cannula oxygen  Post-op Assessment: Report given to RN and Post -op Vital signs reviewed and stable  Post vital signs: Reviewed and stable  Last Vitals:  Vitals Value Taken Time  BP    Temp    Pulse    Resp    SpO2      Last Pain:  Vitals:   12/15/23 1149  TempSrc:   PainSc: 0-No pain         Complications: No notable events documented.

## 2023-12-15 NOTE — Interval H&P Note (Signed)
History and Physical Interval Note:  12/15/2023 1:37 PM  Caleb Barnes  has presented today for surgery, with the diagnosis of AFIB.  The various methods of treatment have been discussed with the patient and family. After consideration of risks, benefits and other options for treatment, the patient has consented to  Procedure(s): CARDIOVERSION (N/A) as a surgical intervention.  The patient's history has been reviewed, patient examined, no change in status, stable for surgery.  I have reviewed the patient's chart and labs.  Questions were answered to the patient's satisfaction.     [name] [employer]

## 2023-12-15 NOTE — Anesthesia Postprocedure Evaluation (Signed)
Anesthesia Post Note  Patient: Caleb Barnes  Procedure(s) Performed: CARDIOVERSION     Patient location during evaluation: Cath Lab Anesthesia Type: General Level of consciousness: sedated and patient cooperative Pain management: pain level controlled Vital Signs Assessment: post-procedure vital signs reviewed and stable Respiratory status: spontaneous breathing Cardiovascular status: stable Anesthetic complications: no   No notable events documented.  Last Vitals:  Vitals:   12/15/23 1420 12/15/23 1430  BP: (!) 122/96 121/76  Pulse: 68 64  Resp: 18 11  Temp:    SpO2: 97% 97%    Last Pain:  Vitals:   12/15/23 1407  TempSrc: Temporal  PainSc: 0-No pain                 Lewie Loron

## 2023-12-16 ENCOUNTER — Encounter (HOSPITAL_COMMUNITY): Payer: Self-pay | Admitting: Cardiology

## 2023-12-16 ENCOUNTER — Other Ambulatory Visit (HOSPITAL_COMMUNITY): Payer: Self-pay

## 2023-12-17 ENCOUNTER — Other Ambulatory Visit (HOSPITAL_COMMUNITY): Payer: Self-pay

## 2024-01-01 ENCOUNTER — Other Ambulatory Visit (HOSPITAL_COMMUNITY): Payer: Self-pay | Admitting: Cardiology

## 2024-01-01 ENCOUNTER — Other Ambulatory Visit (HOSPITAL_COMMUNITY): Payer: Self-pay

## 2024-01-04 ENCOUNTER — Other Ambulatory Visit (HOSPITAL_COMMUNITY): Payer: Self-pay

## 2024-01-06 ENCOUNTER — Telehealth (HOSPITAL_COMMUNITY): Payer: Self-pay | Admitting: Pharmacy Technician

## 2024-01-06 ENCOUNTER — Other Ambulatory Visit (HOSPITAL_COMMUNITY): Payer: Self-pay

## 2024-01-06 NOTE — Telephone Encounter (Signed)
 Advanced Heart Failure Patient Advocate Encounter  Patient provided pharmacy information. It is the same information we have on file. Messages states, Provider Not Eligible to Perform Service [employer];Customer Service: [phone]. Patient is going to call customer service and let us  know what they advise.  Almarie JULIANNA Pa, CPhT

## 2024-01-06 NOTE — Telephone Encounter (Signed)
 Advanced Heart Failure Patient Advocate Encounter  Patient called and had concerns regarding Eliquis  co-pay. Test claim suggests that the provider may not be in network. Called and spoke with patient. He states he has a new insurance card this year. I asked him to read me the information on the card, so I can check it against what we have on file. He is going to call back with the information and we will make a plan from there.   Almarie JULIANNA Pa, CPhT

## 2024-01-07 ENCOUNTER — Other Ambulatory Visit (HOSPITAL_COMMUNITY): Payer: Self-pay | Admitting: *Deleted

## 2024-01-07 MED ORDER — ENTRESTO 49-51 MG PO TABS: 1.0000 | ORAL_TABLET | Freq: Two times a day (BID) | ORAL | 3 refills | Status: DC

## 2024-01-07 MED ORDER — APIXABAN 5 MG PO TABS: 5.0000 mg | ORAL_TABLET | Freq: Two times a day (BID) | ORAL | 3 refills | Status: DC

## 2024-01-07 NOTE — Telephone Encounter (Signed)
 RXs signed by Dr Shirlee Latch and faxed back to [employer] savings program at [phone]

## 2024-01-07 NOTE — Telephone Encounter (Signed)
 Medication Samples have been provided to the patient.  Drug name: eliquis        Strength: 5 mg        Qty: 56  LOT: [id number]  Exp.Date: 12/2024  Dosing instructions: one tab twice a day  The patient has been instructed regarding the correct time, dose, and frequency of taking this medication, including desired effects and most common side effects.   Jerona Dalton HERO 8:54 AM 01/07/2024

## 2024-01-19 NOTE — Telephone Encounter (Signed)
Advanced Heart Failure Patient Advocate Encounter  Patient called back, stating insurance will be able to get him a free 3 month supply of Eliquis and Entresto, the RXs just need to be sent to [facility]. These were both sent in via fax, [phone]. Phone number, [phone].  Archer Asa, CPhT

## 2024-01-20 ENCOUNTER — Telehealth (HOSPITAL_COMMUNITY): Payer: Self-pay

## 2024-01-20 ENCOUNTER — Ambulatory Visit (HOSPITAL_COMMUNITY)
Admission: RE | Admit: 2024-01-20 | Discharge: 2024-01-20 | Disposition: A | Discharge status: Home / Self Care | Payer: Managed Care, Other (non HMO) | Source: Ambulatory Visit | Attending: Cardiology | Admitting: Cardiology

## 2024-01-20 ENCOUNTER — Other Ambulatory Visit (HOSPITAL_COMMUNITY): Payer: Self-pay | Admitting: Cardiology

## 2024-01-20 ENCOUNTER — Other Ambulatory Visit (HOSPITAL_COMMUNITY): Payer: Self-pay | Admitting: Radiology

## 2024-01-20 ENCOUNTER — Other Ambulatory Visit (HOSPITAL_COMMUNITY): Payer: Self-pay

## 2024-01-20 ENCOUNTER — Encounter (HOSPITAL_COMMUNITY): Payer: Self-pay | Admitting: Cardiology

## 2024-01-20 VITALS — BP 140/80 | HR 70 | Wt 177.2 lb

## 2024-01-20 DIAGNOSIS — Z8673 Personal history of transient ischemic attack (TIA), and cerebral infarction without residual deficits: Secondary | ICD-10-CM | POA: Diagnosis not present

## 2024-01-20 DIAGNOSIS — Z79899 Other long term (current) drug therapy: Secondary | ICD-10-CM | POA: Diagnosis not present

## 2024-01-20 DIAGNOSIS — I5022 Chronic systolic (congestive) heart failure: Secondary | ICD-10-CM | POA: Diagnosis not present

## 2024-01-20 DIAGNOSIS — R9431 Abnormal electrocardiogram [ECG] [EKG]: Secondary | ICD-10-CM | POA: Insufficient documentation

## 2024-01-20 DIAGNOSIS — I428 Other cardiomyopathies: Secondary | ICD-10-CM | POA: Insufficient documentation

## 2024-01-20 DIAGNOSIS — E785 Hyperlipidemia, unspecified: Secondary | ICD-10-CM | POA: Diagnosis not present

## 2024-01-20 DIAGNOSIS — I480 Paroxysmal atrial fibrillation: Secondary | ICD-10-CM | POA: Diagnosis not present

## 2024-01-20 DIAGNOSIS — I4819 Other persistent atrial fibrillation: Secondary | ICD-10-CM | POA: Insufficient documentation

## 2024-01-20 DIAGNOSIS — Z7901 Long term (current) use of anticoagulants: Secondary | ICD-10-CM | POA: Diagnosis not present

## 2024-01-20 DIAGNOSIS — F1011 Alcohol abuse, in remission: Secondary | ICD-10-CM | POA: Insufficient documentation

## 2024-01-20 LAB — COMPREHENSIVE METABOLIC PANEL
ALT: 23 U/L (ref 0–44)
AST: 27 U/L (ref 15–41)
Albumin: 3.8 g/dL (ref 3.5–5.0)
Alkaline Phosphatase: 46 U/L (ref 38–126)
Anion gap: 9 (ref 5–15)
BUN: 18 mg/dL (ref 8–23)
CO2: 26 mmol/L (ref 22–32)
Calcium: 9 mg/dL (ref 8.9–10.3)
Chloride: 104 mmol/L (ref 98–111)
Creatinine, Ser: 1.25 mg/dL — ABNORMAL HIGH (ref 0.61–1.24)
GFR, Estimated: >60 mL/min (ref >60)
Glucose, Bld: 119 mg/dL — ABNORMAL HIGH (ref 70–99)
Potassium: 4.3 mmol/L (ref 3.5–5.1)
Sodium: 139 mmol/L (ref 135–145)
Total Bilirubin: 0.6 mg/dL (ref 0.0–1.2)
Total Protein: 6.6 g/dL (ref 6.5–8.1)

## 2024-01-20 LAB — ECHOCARDIOGRAM COMPLETE
AR max vel: 1.86 cm2
AV Area VTI: 1.83 cm2
AV Area mean vel: 1.82 cm2
AV Mean grad: 3.7 mm[Hg]
AV Peak grad: 6.4 mm[Hg]
Ao pk vel: 1.27 m/s
Area-P 1/2: 3.78 cm2
Calc EF: 52.8 %
Est EF: 55
S' Lateral: 4 cm
Single Plane A2C EF: 43.8 %
Single Plane A4C EF: 58.8 %

## 2024-01-20 LAB — LIPID PANEL
Cholesterol: 99 mg/dL (ref 0–200)
HDL: 34 mg/dL — ABNORMAL LOW (ref >40)
LDL Cholesterol: 43 mg/dL (ref 0–99)
Total CHOL/HDL Ratio: 2.9 {ratio}
Triglycerides: 112 mg/dL (ref <150)
VLDL: 22 mg/dL (ref 0–40)

## 2024-01-20 MED ORDER — AMIODARONE HCL 200 MG PO TABS: ORAL_TABLET | ORAL | 1 refills | Status: DC

## 2024-01-20 NOTE — Addendum Note (Signed)
Encounter addended by: Laurey Morale, MD on: 01/20/2024 9:46 PM  Actions taken: Clinical Note Signed

## 2024-01-20 NOTE — Progress Notes (Signed)
Height:     Weight: BMI:  Today's Date:  STOP BANG RISK ASSESSMENT S (snore) Have you been told that you snore?     YES   T (tired) Are you often tired, fatigued, or sleepy during the day?   YES  O (obstruction) Do you stop breathing, choke, or gasp during sleep? NO   P (pressure) Do you have or are you being treated for high blood pressure? YES   B (BMI) Is your body index greater than 35 kg/m? NO   A (age) Are you 68 years old or older? YES   N (neck) Do you have a neck circumference greater than 16 inches?   NO   G (gender) Are you a male? YES   TOTAL STOP/BANG "YES" ANSWERS 5                                                                       For Office Use Only              Procedure Order Form    YES to 3+ Stop Bang questions OR two clinical symptoms - patient qualifies for WatchPAT (CPT 95800)      Clinical Notes: Will consult Sleep Specialist and refer for management of therapy due to patient increased risk of Sleep Apnea. Ordering a sleep study due to the following two clinical symptoms: Excessive daytime sleepiness G47.10  / Morning Headaches G44.221 / Difficulty concentrating R41.840 / Memory problems or poor judgment G31.84 / Personality changes or irritability R45.4 / Loud snoring R06.83 / Unrefreshed by sleep G47.8 /  History of high blood pressure R03.0

## 2024-01-20 NOTE — Progress Notes (Addendum)
Patient ID: Caleb Barnes, male   DOB: 1956/09/25, 68 y.o.   MRN: [id number]    Advanced Heart Failure Clinic Note   HF Cardiology: Dr. Shirlee Latch   HPI:  Caleb Barnes is a 68 y.o. male with h/o tobacco abuse and ETOH abuse who presented to [facility] ED on 03/31/16 with SOB. BNP and LFTs were elevated. Was found to be in atrial fibrillation with RVR. Echo 03/31/16 with reduced EF to 20-25%, mild MR, mild/mod reduced RV systolic function, Peak PA systolic 43 mm Hg.  CTA chest with no PE. He was also placed on CIWA protocol for extensive ETOH use as an outpatient. GI consulted with elevated LFTs at [facility]. Abd [place] with gallbladder thickening but normal-appearing liver. He was started on amiodarone drip for atrial fibrillation. Diuresed with IV lasix 40 mg BID.  He was started on Dobutamine and Dopamine drips. Was transferred to [facility] for further evaluation and treatment with worsening condition.   He was weaned off both dopamine and dobutamine in CCU and went back into NSR.  [facility] 04/04/16 showed low CO, mildly elevated filling pressures, and mild nonobstructive CAD. Started on milrinone with low cardiac output. 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  He tolerated milrinone wean and was discharged to home without inotropic support and in NSR.  Repeat echo in 8/17 showed EF up to 55-60%. Echo 8/18 showed EF 50-55%.    Echo in 7/19 showed EF 55-60%.  Echo in 10/21 showed EF 60-65%, normal.   Patient went back into atrial fibrillation, had DCCV in 12/24 to NSR.   Echo was done today and reviewed, EF 55%, normal RV, mild biatrial enlargement.   He presents today for followup of CHF.  He is not smoking and rarely drinks ETOH.  He is back in atrial fibrillation today.  He feels a little more tired but does not have significant exertional dyspnea.  He does not feel palpitations.  No chest pain.  No orthopnea/PND.  Patient is not sure if he snores, he does have  some daytime sleepiness.   ECG: personally reviewed. Atrial fibrillation with poor RWP  Labs (4/17): HCT 46.1 Labs (5/17): K 4.3, creatinine 0.98, LFTs normal Labs (7/17): K 4.4, creatinine 1.05 Labs (2/18): K 4.4, creatinine 1.5, LDL 137, hgb 12.7 Labs (6/18): TSH normal Labs (9/18): K 4.1, creatinine 1.09 Labs (1/19): K 3.6, creatinine 1.05, hgb 13.5.  Labs (7/19): LDL 136, K 4, creatinine 1.22 Labs (3/22): K 4.1, creatinine 1.2 Labs (1/23): LDL 44 Labs (11/23): K 4.2, creatinine 1.44 Labs (12/24): LDL 96, hgb 13.8, TSH normal, K 4.9, creatinine 1.33  PMH 1. Chronic systolic CHF: Nonischemic cardiomyopathy.  Possible etiologies include tachy-mediated CMP with atrial fib/RVR of unknown duration and ETOH CMP.  - R/LHC (4/17): Mild nonobstructive CAD; mean RA 7, PA 43/25, mean PCWP 21, CI 1.41 Fick and 1.78 thermodilution.  - Echo (4/17): EF < 20%, moderate LV dilation, mid to moderately decreased RV systolic function.  - Echo (8/17): EF 55-60%, moderate LVH, normal RV size and systolic function.  - Echo (8/18): EF 50-55%, PASP 28 mmHg - Echo (7/19): EF 55-60%, normal.  - Echo (7/21): EF 60-65%, normal - Echo (1/25): EF 55%, normal RV, mild biatrial enlargement. 2. Atrial fibrillation: Paroxysmal. - DCCV in 12/24 3. ETOH abuse: Has quit.  4. COPD  Current Outpatient Medications  Medication Sig Dispense Refill   amiodarone (PACERONE) 200 MG tablet Take 1 tablet (200  mg total) by mouth 2 (two) times daily for 14 days, THEN 1 tablet (200 mg total) daily. 200 tablet 1   apixaban (ELIQUIS) 5 MG TABS tablet Take 1 tablet (5 mg total) by mouth 2 (two) times daily. 180 tablet 3   colchicine 0.6 MG tablet Take 0.6 mg by mouth.     escitalopram (LEXAPRO) 10 MG tablet Take 1 tablet (10 mg total) by mouth daily. 30 tablet 2   ibuprofen (ADVIL) 200 MG tablet Take 400 mg by mouth every 6 (six) hours as needed for moderate pain (pain score 4-6).     metoprolol succinate (TOPROL-XL) 50 MG 24 hr  tablet Take 1.5 tablets (75 mg total) by mouth daily. 135 tablet 3   naproxen sodium (ALEVE) 220 MG tablet Take 220 mg by mouth daily as needed (pain).     omeprazole (PRILOSEC) 40 MG capsule TAKE 1 CAPSULE (40 MG TOTAL) BY MOUTH 2 (TWO) TIMES DAILY BEFORE A MEAL. 180 capsule 3   rosuvastatin (CRESTOR) 20 MG tablet Take 1 tablet (20 mg total) by mouth daily. 90 tablet 3   sacubitril-valsartan (ENTRESTO) 49-51 MG Take 1 tablet by mouth 2 (two) times daily. 180 tablet 3   spironolactone (ALDACTONE) 25 MG tablet Take 0.5 tablets (12.5 mg total) by mouth daily. Please call for office visit [phone] 45 tablet 3   No current facility-administered medications for this encounter.    Allergies  Allergen Reactions   Bee Venom Swelling    redness      Social History   Socioeconomic History   Marital status: Divorced    Spouse name: Not on file   Number of children: Not on file   Years of education: Not on file   Highest education level: Not on file  Occupational History   Not on file  Tobacco Use   Smoking status: Former    Current packs/day: 0.00    Average packs/day: 2.0 packs/day for 50.0 years (100.0 ttl pk-yrs)    Types: Cigarettes    Start date: 67    Quit date: 2014    Years since quitting: 11.0   Smokeless tobacco: Never  Vaping Use   Vaping status: Never Used  Substance and Sexual Activity   Alcohol use: Yes    Alcohol/week: 3.0 standard drinks of alcohol    Types: 3 Cans of beer per week   Drug use: Yes    Frequency: 2.0 times per week    Types: Marijuana   Sexual activity: Not on file  Other Topics Concern   Not on file  Social History Narrative   Not on file   Social Drivers of Health   Financial Resource Strain: Not on file  Food Insecurity: Not on file  Transportation Needs: Not on file  Physical Activity: Not on file  Stress: Not on file  Social Connections: Not on file  Intimate Partner Violence: Not on file      Family History  Problem  Relation Age of Onset   CAD Father    Depression Father    COPD Mother    Hypertension Mother    Review of systems complete and found to be negative unless listed in HPI.   Vitals:   01/20/24 1340  BP: (!) 140/80  Pulse: 70  SpO2: 97%  Weight: 80.4 kg (177 lb 3.2 oz)   Wt Readings from Last 3 Encounters:  01/20/24 80.4 kg (177 lb 3.2 oz)  12/15/23 76.2 kg (168 lb)  12/09/23 80.2 kg (176  lb 12.8 oz)     PHYSICAL EXAM: General: NAD Neck: No JVD, no thyromegaly or thyroid nodule.  Lungs: Clear to auscultation bilaterally with normal respiratory effort. CV: Nondisplaced PMI.  Heart irregular S1/S2, no S3/S4, no murmur.  No peripheral edema.  No carotid bruit.  Normal pedal pulses.  Abdomen: Soft, nontender, no hepatosplenomegaly, no distention.  Skin: Intact without lesions or rashes.  Neurologic: Alert and oriented x 3.  Psych: Normal affect. Extremities: No clubbing or cyanosis.  HEENT: Normal.   ASSESSMENT & PLAN:  1. Chronic systolic CHF: Nonischemic cardiomyopathy.  I suspect that this was a combination of tachy-mediated CMP + ETOH CMP.  He has quit drinking.  Echo in 8/18 showed EF 50-55%.  Echo today showed EF 55% with normal RV even though he is back in atrial fibrillation.  Currently NYHA class I-II.  He is not volume overloaded on exam.  Given prior history of tachycardia-mediated CMP, I think we need to keep him in NSR.  - Continue current Entresto and spironolactone.   - Continue Toprol XL 75 mg daily.  - Check BMET/BNP today and will need BMET every 3 months.  2. Atrial fibrillation: Persistent.  He is in atrial fibrillation today after DCCV to NSR in 12/24.  It looks like he is not going to hold NSR without an antiarrhythmic medications.  - Continue apixaban. Has not missed doses.  - Start amiodarone 200 mg bid x 2 wks then 200 mg daily.  Will need to follow LFTs and TSH.  He tolerated amiodarone in the past.  - Repeat ECG in 3 wks.  If still in atrial fibrillation  after starting amiodarone, will repeat DCCV.  - He is going to see EP for evaluation for ablation. I would rather him not continue amiodarone long-term.  3. Hyperlipidemia: Crestor recently increased.  - Check lipids/LFTs today.  4. Suspect OSA: I will arrange for sleep study.   Followup in 3-4 months.   [name] 01/20/2024

## 2024-01-20 NOTE — Patient Instructions (Addendum)
START Amiodarone 200 mg Twice daily for 14 days, then go to 200 mg daily.  Labs done today, your results will be available in MyChart, we will contact you for abnormal readings.  You will be called to have your sleep study arranged.  Your physician recommends that you schedule a follow-up appointment in: 3 weeks for a repeat EKG, then 3 months with Dr.McLean ( April) ** PLEASE CALL THE OFFICE IN MID FEBRUARY TO ARRANGE YOUR FOLLOW UP APPOINTMENT.**  If you have any questions or concerns before your next appointment please send [place] a message through [place] or call our office at [phone].    TO LEAVE A MESSAGE FOR THE NURSE SELECT OPTION 2, PLEASE LEAVE A MESSAGE INCLUDING: YOUR NAME DATE OF BIRTH CALL BACK NUMBER REASON FOR CALL**this is important as we prioritize the call backs  YOU WILL RECEIVE A CALL BACK THE SAME DAY AS LONG AS YOU CALL BEFORE 4:00 PM  At the Advanced [facility] Failure Clinic, you and your health needs are our priority. As part of our continuing mission to provide you with exceptional heart care, we have created designated Provider Care Teams. These Care Teams include your primary Cardiologist (physician) and Advanced Practice Providers (APPs- Physician Assistants and Nurse Practitioners) who all work together to provide you with the care you need, when you need it.   You may see any of the following providers on your designated Care Team at your next follow up: Dr Arvilla Meres Dr [name] Dr. Dorthula Nettles Dr. Clearnce Hasten Amy Filbert Schilder, NP Robbie Lis, [place] [facility] [place], [place] Brynda Peon, NP [place] Lee, NP Karle Plumber, PharmD   Please be sure to bring in all your medications bottles to every appointment.    Thank you for choosing [facility]

## 2024-01-20 NOTE — Telephone Encounter (Addendum)
Called Patient to review his lab results and he needed some Eliquis and Entresto samples until his medication goes thru with his insurance per [facility] it was ok to give patient a 30 supply of each samples. Samples has been placed up front for patient to pick up. Pt aware, agreeable, and verbalized understanding.   Medication Samples have been provided to the patient.  Drug name: Eliquis       Strength: 5 mg        Qty: 56  LOT: [id number]  Exp.Date: March 2026  Dosing instructions: Patient takes 1 tablet by mouth twice a day.  The patient has been instructed regarding the correct time, dose, and frequency of taking this medication, including desired effects and most common side effects.   Novella Rob Laqueshia Cihlar 4:30 PM 01/20/2024   Medication Samples have been provided to the patient.  Drug name: Sherryll Burger       Strength: 49/51        Qty: 56  LOT: [id number]  Exp.Date: November 27, 2025  Dosing instructions: Patient takes 1 tablet by mouth twice a day.  The patient has been instructed regarding the correct time, dose, and frequency of taking this medication, including desired effects and most common side effects.   Novella Rob Oneal Schoenberger 4:30 PM 01/20/2024

## 2024-01-28 ENCOUNTER — Other Ambulatory Visit (HOSPITAL_COMMUNITY): Payer: Self-pay

## 2024-01-28 DIAGNOSIS — I5022 Chronic systolic (congestive) heart failure: Secondary | ICD-10-CM

## 2024-02-08 ENCOUNTER — Encounter (HOSPITAL_COMMUNITY): Payer: Managed Care, Other (non HMO)

## 2024-02-11 ENCOUNTER — Encounter: Payer: Self-pay | Admitting: Cardiology

## 2024-02-11 ENCOUNTER — Ambulatory Visit: Payer: 59 | Admitting: Cardiology

## 2024-02-11 ENCOUNTER — Ambulatory Visit
Admission: RE | Admit: 2024-02-11 | Discharge: 2024-02-11 | Disposition: A | Discharge status: Home / Self Care | Payer: 59 | Source: Ambulatory Visit | Attending: Cardiology | Admitting: Cardiology

## 2024-02-11 ENCOUNTER — Encounter (HOSPITAL_COMMUNITY): Payer: Self-pay

## 2024-02-11 VITALS — BP 122/70 | HR 55 | Ht 70.0 in | Wt 171.0 lb

## 2024-02-11 DIAGNOSIS — Z01812 Encounter for preprocedural laboratory examination: Secondary | ICD-10-CM | POA: Diagnosis not present

## 2024-02-11 DIAGNOSIS — I480 Paroxysmal atrial fibrillation: Secondary | ICD-10-CM | POA: Diagnosis not present

## 2024-02-11 DIAGNOSIS — I4891 Unspecified atrial fibrillation: Secondary | ICD-10-CM

## 2024-02-11 DIAGNOSIS — I5022 Chronic systolic (congestive) heart failure: Secondary | ICD-10-CM

## 2024-02-11 DIAGNOSIS — D6869 Other thrombophilia: Secondary | ICD-10-CM

## 2024-02-11 NOTE — Addendum Note (Signed)
Addended by: Baird Lyons on: 02/11/2024 03:44 PM   Modules accepted: Orders

## 2024-02-11 NOTE — Patient Instructions (Signed)
Medication Instructions:  Your physician recommends that you continue on your current medications as directed. Please refer to the Current Medication list given to you today.  *If you need a refill on your cardiac medications before your next appointment, please call your pharmacy*   Lab Work: Pre procedure labs -- we will call you to schedule:  BMP & CBC  If you have a lab test that is abnormal and we need to change your treatment, we will call you to review the results -- otherwise no news is good news.    Testing/Procedures: Your physician has requested that you have cardiac CT 1 month PRIOR to your ablation. Cardiac computed tomography (CT) is a painless test that uses an x-ray machine to take clear, detailed pictures of your heart. We will contact you if the result is abnormal. We will call you to schedule.  Your physician has recommended that you have an ablation. Catheter ablation is a medical procedure used to treat some cardiac arrhythmias (irregular heartbeats). During catheter ablation, a long, thin, flexible tube is put into a blood vessel in your groin (upper thigh), or neck. This tube is called an ablation catheter. It is then guided to your heart through the blood vessel. Radio frequency waves destroy small areas of heart tissue where abnormal heartbeats may cause an arrhythmia to start.   Your ablation is scheduled for 05/18/2024. Please arrive at [facility] at 12:00 pm.  We will call you for further instructions.   Follow-Up: At [facility], you and your health needs are our priority.  As part of our continuing mission to provide you with exceptional heart care, we have created designated Provider Care Teams.  These Care Teams include your primary Cardiologist (physician) and Advanced Practice Providers (APPs -  Physician Assistants and Nurse Practitioners) who all work together to provide you with the care you need, when you need it.  Your next appointment:   1  month(s) after your ablation  The format for your next appointment:   In Person  Provider:   AFib clinic   Thank you for choosing [facility]!!   Dory Horn, RN [phone]    Other Instructions   Cardiac Ablation Cardiac ablation is a procedure to destroy (ablate) some heart tissue that is sending bad signals. These bad signals cause problems in heart rhythm. The heart has many areas that make these signals. If there are problems in these areas, they can make the heart beat in a way that is not normal. Destroying some tissues can help make the heart rhythm normal. Tell your doctor about: Any allergies you have. All medicines you are taking. These include vitamins, herbs, eye drops, creams, and over-the-counter medicines. Any problems you or family members have had with medicines that make you fall asleep (anesthetics). Any blood disorders you have. Any surgeries you have had. Any medical conditions you have, such as kidney failure. Whether you are pregnant or may be pregnant. What are the risks? This is a safe procedure. But problems may occur, including: Infection. Bruising and bleeding. Bleeding into the chest. Stroke or blood clots. Damage to nearby areas of your body. Allergies to medicines or dyes. The need for a pacemaker if the normal system is damaged. Failure of the procedure to treat the problem. What happens before the procedure? Medicines Ask your doctor about: Changing or stopping your normal medicines. This is important. Taking aspirin and ibuprofen. Do not take these medicines unless your doctor tells you to  take them. Taking other medicines, vitamins, herbs, and supplements. General instructions Follow instructions from your doctor about what you cannot eat or drink. Plan to have someone take you home from the hospital or clinic. If you will be going home right after the procedure, plan to have someone with you for 24 hours. Ask your doctor  what steps will be taken to prevent infection. What happens during the procedure?  An IV tube will be put into one of your veins. You will be given a medicine to help you relax. The skin on your neck or groin will be numbed. A cut (incision) will be made in your neck or groin. A needle will be put through your cut and into a large vein. A tube (catheter) will be put into the needle. The tube will be moved to your heart. Dye may be put through the tube. This helps your doctor see your heart. Small devices (electrodes) on the tube will send out signals. A type of energy will be used to destroy some heart tissue. The tube will be taken out. Pressure will be held on your cut. This helps stop bleeding. A bandage will be put over your cut. The exact procedure may vary among doctors and hospitals. What happens after the procedure? You will be watched until you leave the hospital or clinic. This includes checking your heart rate, breathing rate, oxygen, and blood pressure. Your cut will be watched for bleeding. You will need to lie still for a few hours. Do not drive for 24 hours or as long as your doctor tells you. Summary Cardiac ablation is a procedure to destroy some heart tissue. This is done to treat heart rhythm problems. Tell your doctor about any medical conditions you may have. Tell him or her about all medicines you are taking to treat them. This is a safe procedure. But problems may occur. These include infection, bruising, bleeding, and damage to nearby areas of your body. Follow what your doctor tells you about food and drink. You may also be told to change or stop some of your medicines. After the procedure, do not drive for 24 hours or as long as your doctor tells you. This information is not intended to replace advice given to you by your health care provider. Make sure you discuss any questions you have with your health care provider. Document Revised: 03/07/2022 Document  Reviewed: 11/17/2019 Elsevier Patient Education  2023 Elsevier Inc.   Cardiac Ablation, Care After  This sheet gives you information about how to care for yourself after your procedure. Your health care provider may also give you more specific instructions. If you have problems or questions, contact your health care provider. What can I expect after the procedure? After the procedure, it is common to have: Bruising around your puncture site. Tenderness around your puncture site. Skipped heartbeats. If you had an atrial fibrillation ablation, you may have atrial fibrillation during the first several months after your procedure.  Tiredness (fatigue).  Follow these instructions at home: Puncture site care  Follow instructions from your health care provider about how to take care of your puncture site. Make sure you: If present, leave stitches (sutures), skin glue, or adhesive strips in place. These skin closures may need to stay in place for up to 2 weeks. If adhesive strip edges start to loosen and curl up, you may trim the loose edges. Do not remove adhesive strips completely unless your health care provider tells you to do that.  If a large square bandage is present, this may be removed 24 hours after surgery.  Check your puncture site every day for signs of infection. Check for: Redness, swelling, or pain. Fluid or blood. If your puncture site starts to bleed, lie down on your back, apply firm pressure to the area, and contact your health care provider. Warmth. Pus or a bad smell. A pea or small marble sized lump at the site is normal and can take up to three months to resolve.  Driving Do not drive for at least 4 days after your procedure or however long your health care provider recommends. (Do not resume driving if you have previously been instructed not to drive for other health reasons.) Do not drive or use heavy machinery while taking prescription pain medicine. Activity Avoid  activities that take a lot of effort for at least 7 days after your procedure. Do not lift anything that is heavier than 5 lb (4.5 kg) for one week.  No sexual activity for 1 week.  Return to your normal activities as told by your health care provider. Ask your health care provider what activities are safe for you. General instructions Take over-the-counter and prescription medicines only as told by your health care provider. Do not use any products that contain nicotine or tobacco, such as cigarettes and e-cigarettes. If you need help quitting, ask your health care provider. You may shower after 24 hours, but Do not take baths, swim, or use a hot tub for 1 week.  Do not drink alcohol for 24 hours after your procedure. Keep all follow-up visits as told by your health care provider. This is important. Contact a health care provider if: You have redness, mild swelling, or pain around your puncture site. You have fluid or blood coming from your puncture site that stops after applying firm pressure to the area. Your puncture site feels warm to the touch. You have pus or a bad smell coming from your puncture site. You have a fever. You have chest pain or discomfort that spreads to your neck, jaw, or arm. You have chest pain that is worse with lying on your back or taking a deep breath. You are sweating a lot. You feel nauseous. You have a fast or irregular heartbeat. You have shortness of breath. You are dizzy or light-headed and feel the need to lie down. You have pain or numbness in the arm or leg closest to your puncture site. Get help right away if: Your puncture site suddenly swells. Your puncture site is bleeding and the bleeding does not stop after applying firm pressure to the area. These symptoms may represent a serious problem that is an emergency. Do not wait to see if the symptoms will go away. Get medical help right away. Call your local emergency services (911 in the [place] Do not  drive yourself to the hospital. Summary After the procedure, it is normal to have bruising and tenderness at the puncture site in your groin, neck, or forearm. Check your puncture site every day for signs of infection. Get help right away if your puncture site is bleeding and the bleeding does not stop after applying firm pressure to the area. This is a medical emergency. This information is not intended to replace advice given to you by your health care provider. Make sure you discuss any questions you have with your health care provider.

## 2024-02-11 NOTE — Progress Notes (Signed)
Electrophysiology Office Note:   Date:  02/11/2024  ID:  Caleb Barnes, DOB 03-25-1956, MRN [id number]  Primary Cardiologist: None Primary Heart Failure: [name], MD Electrophysiologist: None      History of Present Illness:   Caleb Barnes is a 68 y.o. male with h/o tobacco and alcohol abuse, atrial fibrillation, chronic systolic heart failure seen today for  for Electrophysiology evaluation of atrial fibrillation at the request of [name].    He presented to the hospital in 2017 with shortness of breath and elevated LFTs.  Ejection fraction was found to be 20 to 25%.  She was started on amiodarone.  He converted to sinus rhythm without cardioversion.  Repeat echo in 2017 with maintenance of sinus rhythm showed normalized ejection fraction.  He went back into atrial fibrillation and had cardioversion December 2024.  On presentation to heart failure clinic, he was found to be back in atrial fibrillation.  He has now post amiodarone load and cardioversion.  He currently feels well.  When he was in atrial fibrillation, he had significant fatigue and shortness of breath.  Review of systems complete and found to be negative unless listed in HPI.   EP Information / Studies Reviewed:    EKG is ordered today. Personal review as below.  EKG Interpretation Date/Time:  Thursday February 11 2024 14:58:40 EST Ventricular Rate:  56 PR Interval:  192 QRS Duration:  104 QT Interval:  460 QTC Calculation: 443 R Axis:   88  Text Interpretation: Sinus bradycardia When compared with ECG of 20-Jan-2024 13:46, Sinus rhythm has replaced Atrial fibrillation Questionable change in QRS axis Confirmed by Abdulkarim Eberlin ([id number]) on 02/11/2024 3:05:01 PM     Risk Assessment/Calculations:    CHA2DS2-VASc Score = 2   This indicates a 2.2% annual risk of stroke. The patient's score is based upon: CHF History: 1 HTN History: 0 Diabetes History: 0 Stroke History: 0 Vascular Disease History: 0 Age Score:  1 Gender Score: 0             Physical Exam:   VS:  BP 122/70 (BP Location: Right Arm, Patient Position: Sitting, Cuff Size: Large)   Pulse (!) 55   Ht 5\' 10"  (1.778 m)   Wt 171 lb (77.6 kg)   SpO2 97%   BMI 24.54 kg/m    Wt Readings from Last 3 Encounters:  02/11/24 171 lb (77.6 kg)  01/20/24 177 lb 3.2 oz (80.4 kg)  12/15/23 168 lb (76.2 kg)     GEN: Well nourished, well developed in no acute distress NECK: No JVD; No carotid bruits CARDIAC: Regular rate and rhythm, no murmurs, rubs, gallops RESPIRATORY:  Clear to auscultation without rales, wheezing or rhonchi  ABDOMEN: Soft, non-tender, non-distended EXTREMITIES:  No edema; No deformity   ASSESSMENT AND PLAN:    1.  Persistent atrial fibrillation: Post cardioversion in December but now back in atrial fibrillation.  On amiodarone 200 mg daily.  Due to his age, would be best if amiodarone was a short-term medication.  Due to that, we Bradley Bostelman plan for ablation.  Risk and benefits have been discussed.  He understands these risks and is agreed to the procedure.  Risk, benefits, and alternatives to EP study and radiofrequency/pulse field ablation for afib were also discussed in detail today. These risks include but are not limited to stroke, bleeding, vascular damage, tamponade, perforation, damage to the esophagus, lungs, and other structures, pulmonary vein stenosis, worsening renal function, and death. The patient understands these risk and  wishes to proceed.  We Salisha Bardsley therefore proceed with catheter ablation at the next available time.  Carto, ICE, anesthesia are requested for the procedure.  Maxime Beckner also obtain CT PV protocol prior to the procedure to exclude LAA thrombus and further evaluate atrial anatomy.  2.  Chronic systolic heart failure: Due to nonischemic cardiomyopathy.  Ejection fraction has since improved.  Cardiomyopathy potentially due to atrial fibrillation and alcohol.  Continue plan per primary cardiology.  3.   Obstructive sleep apnea: CPAP compliance encouraged  4.  Secondary hypercoagulable state: Currently on Eliquis for atrial fibrillation  Discussed with primary cardiology  Follow up with Dr. Elberta Fortis as usual post procedure  Signed, Joziah Dollins Jorja Loa, MD

## 2024-02-16 ENCOUNTER — Telehealth (HOSPITAL_COMMUNITY): Payer: Self-pay | Admitting: Pharmacy Technician

## 2024-02-16 NOTE — Telephone Encounter (Signed)
Medication Samples have been provided to the patient.  Drug name: Sherryll Burger     Strength: 49-51mg         Qty: 2 bottles  LOT: [id number]  Exp.Date: 10/26  Dosing instructions: Take 1 tablet by mouth twice daily.   The patient has been instructed regarding the correct time, dose, and frequency of taking this medication, including desired effects and most common side effects.   Allen Kell Johnnay Pleitez 4:14 PM 02/16/2024

## 2024-03-03 ENCOUNTER — Telehealth (HOSPITAL_COMMUNITY): Payer: Self-pay

## 2024-03-03 NOTE — Telephone Encounter (Signed)
 Advanced Heart Failure Patient Advocate Encounter  Medication Samples have been left at registration desk for patient pick up. Drug name: Eliquis 5 MG Qty: 4x 14 ct packages LOT: [id number] Exp.: 05/2025 SIG: Take 1 tablet by mouth twice daily   The patient has been instructed regarding the correct time, dose, and frequency of taking this medication, including desired effects and most common side effects.   Product verified by Karle Plumber.  Burnell Blanks, CPhT Rx Patient Advocate Phone: [phone]

## 2024-03-08 ENCOUNTER — Other Ambulatory Visit (HOSPITAL_COMMUNITY): Payer: Self-pay

## 2024-03-14 ENCOUNTER — Other Ambulatory Visit (HOSPITAL_COMMUNITY): Payer: Self-pay | Admitting: Cardiology

## 2024-03-28 ENCOUNTER — Encounter (HOSPITAL_BASED_OUTPATIENT_CLINIC_OR_DEPARTMENT_OTHER): Payer: Managed Care, Other (non HMO) | Admitting: Cardiology

## 2024-04-28 ENCOUNTER — Telehealth: Payer: Self-pay

## 2024-04-28 ENCOUNTER — Other Ambulatory Visit: Payer: Self-pay

## 2024-04-28 DIAGNOSIS — I480 Paroxysmal atrial fibrillation: Secondary | ICD-10-CM

## 2024-04-28 DIAGNOSIS — Z01812 Encounter for preprocedural laboratory examination: Secondary | ICD-10-CM

## 2024-04-28 NOTE — Telephone Encounter (Signed)
 Called pt and went over Ablation/CT Instructions. He does NOT use his MyChart. I have mailed his Instructions to him.   He will get labs done at [facility] the same day as his CT which is being done also at [facility].   Pt has my direct number if he needs to call with any questions or concerns.

## 2024-05-02 ENCOUNTER — Other Ambulatory Visit: Payer: Self-pay

## 2024-05-02 DIAGNOSIS — I480 Paroxysmal atrial fibrillation: Secondary | ICD-10-CM

## 2024-05-05 ENCOUNTER — Other Ambulatory Visit
Admission: RE | Admit: 2024-05-05 | Discharge: 2024-05-05 | Disposition: A | Discharge status: Home / Self Care | Source: Ambulatory Visit | Attending: Cardiology | Admitting: Cardiology

## 2024-05-05 ENCOUNTER — Ambulatory Visit
Admission: RE | Admit: 2024-05-05 | Discharge: 2024-05-05 | Disposition: A | Discharge status: Home / Self Care | Payer: 59 | Source: Ambulatory Visit | Attending: Cardiology | Admitting: Cardiology

## 2024-05-05 DIAGNOSIS — I480 Paroxysmal atrial fibrillation: Secondary | ICD-10-CM | POA: Diagnosis present

## 2024-05-05 LAB — BASIC METABOLIC PANEL WITH GFR
Anion gap: 12 (ref 5–15)
BUN: 16 mg/dL (ref 8–23)
CO2: 24 mmol/L (ref 22–32)
Calcium: 9.2 mg/dL (ref 8.9–10.3)
Chloride: 103 mmol/L (ref 98–111)
Creatinine, Ser: 1.21 mg/dL (ref 0.61–1.24)
GFR, Estimated: >60 mL/min (ref >60)
Glucose, Bld: 104 mg/dL — ABNORMAL HIGH (ref 70–99)
Potassium: 4.4 mmol/L (ref 3.5–5.1)
Sodium: 139 mmol/L (ref 135–145)

## 2024-05-05 MED ORDER — IOHEXOL 350 MG/ML SOLN
80.0000 mL | Freq: Once | INTRAVENOUS | Status: AC | PRN
  Administered 2024-05-05: 80 mL via INTRAVENOUS

## 2024-05-11 ENCOUNTER — Telehealth (HOSPITAL_COMMUNITY): Payer: Self-pay

## 2024-05-11 DIAGNOSIS — Z01812 Encounter for preprocedural laboratory examination: Secondary | ICD-10-CM

## 2024-05-11 DIAGNOSIS — I480 Paroxysmal atrial fibrillation: Secondary | ICD-10-CM

## 2024-05-11 NOTE — Telephone Encounter (Signed)
 Spoke with patient to discuss upcoming procedure.   CT: completed.  Labs: BMP completed. Order placed for CBC- pt will have drawn by 5/16.  Any recent signs of acute illness or been started on antibiotics? No Any new medications started? No Any medications to hold? No Any missed doses of blood thinner? No Advised patient to continue taking ANTICOAGULANT: Eliquis  (Apixaban ) twice daily without missing any doses.  Medication instructions:  On the morning of your procedure DO NOT take any medication., including Eliquis  or the procedure may be rescheduled. Nothing to eat or drink after midnight prior to your procedure.  Confirmed patient is scheduled for Atrial Fibrillation Ablation on Wednesday, May 21 with Dr. Agatha Horsfall. Instructed patient to arrive at the Main Entrance A at [facility]: [address] [place], [place] [address] and check in at Admitting at 11:30 AM.  Advised of plan to go home the same day and will only stay overnight if medically necessary. You MUST have a responsible adult to drive you home and MUST be with you the first 24 hours after you arrive home or your procedure could be cancelled.  Patient verbalized understanding to all instructions provided and agreed to proceed with procedure.

## 2024-05-13 LAB — CBC
Hematocrit: 38.8 % (ref 37.5–51.0)
Hemoglobin: 13.1 g/dL (ref 13.0–17.7)
MCH: 32.7 pg (ref 26.6–33.0)
MCHC: 33.8 g/dL (ref 31.5–35.7)
MCV: 97 fL (ref 79–97)
Platelets: 249 10*3/uL (ref 150–450)
RBC: 4.01 x10E6/uL — ABNORMAL LOW (ref 4.14–5.80)
RDW: 13.9 % (ref 11.6–15.4)
WBC: 7.1 10*3/uL (ref 3.4–10.8)

## 2024-05-15 ENCOUNTER — Ambulatory Visit: Payer: Self-pay | Admitting: Cardiology

## 2024-05-17 NOTE — Anesthesia Preprocedure Evaluation (Addendum)
 Anesthesia Evaluation  Patient identified by MRN, date of birth, ID band Patient awake    Reviewed: Allergy & Precautions, NPO status , Patient's Chart, lab work & pertinent test results  History of Anesthesia Complications Negative for: history of anesthetic complications  Airway Mallampati: II  TM Distance: >3 FB Neck ROM: Full    Dental  (+) Dental Advisory Given, Edentulous Upper, Edentulous Lower   Pulmonary shortness of breath and with exertion, COPD, former smoker   Pulmonary exam normal breath sounds clear to auscultation       Cardiovascular Exercise Tolerance: Good hypertension, Pt. on home beta blockers (-) angina +CHF  (-) Past MI Normal cardiovascular exam+ dysrhythmias Atrial Fibrillation  Rhythm:Regular Rate:Normal  Echo 12/2023  1. Left ventricular ejection fraction, by estimation, is 55%. The left ventricle has normal function. The left ventricle has no regional wall motion abnormalities. Left ventricular diastolic parameters are indeterminate.   2. Right ventricular systolic function is normal. The right ventricular size is normal. There is normal pulmonary artery systolic pressure. The estimated right ventricular systolic pressure is 28.4 mmHg.   3. Left atrial size was mildly dilated.   4. Right atrial size was mildly dilated.   5. The mitral valve is normal in structure. Trivial mitral valve regurgitation. No evidence of mitral stenosis.   6. The aortic valve is tricuspid. Aortic valve regurgitation is not visualized. No aortic stenosis is present.   7. The inferior vena cava is normal in size with greater than 50% respiratory variability, suggesting right atrial pressure of 3 mmHg.   8. The patient was in atrial fibrillation.     Echo 2021  1. Left ventricular ejection fraction, by estimation, is 60 to 65%. The left ventricle has normal function. The left ventricle has no regional wall motion abnormalities.  Left ventricular diastolic parameters were normal.   2. Right ventricular systolic function is normal. The right ventricular size is normal.   3. The mitral valve is normal in structure. No evidence of mitral valve regurgitation. No evidence of mitral stenosis.   4. The aortic valve is normal in structure. Aortic valve regurgitation is not visualized. No aortic stenosis is present.      Neuro/Psych  PSYCHIATRIC DISORDERS Anxiety     negative neurological ROS     GI/Hepatic Neg liver ROS,GERD  Controlled and Medicated,,  Endo/Other  negative endocrine ROS    Renal/GU Renal disease     Musculoskeletal   Abdominal   Peds  Hematology negative hematology ROS (+)   Anesthesia Other Findings   Reproductive/Obstetrics                             Anesthesia Physical Anesthesia Plan  ASA: 3  Anesthesia Plan: General   Post-op Pain Management: Minimal or no pain anticipated and Tylenol  PO (pre-op)*   Induction: Intravenous  PONV Risk Score and Plan: 2 and Treatment may vary due to age or medical condition, Ondansetron  and Dexamethasone   Airway Management Planned: Oral ETT  Additional Equipment:   Intra-op Plan:   Post-operative Plan: Extubation in OR  Informed Consent: I have reviewed the patients History and Physical, chart, labs and discussed the procedure including the risks, benefits and alternatives for the proposed anesthesia with the patient or authorized representative who has indicated his/her understanding and acceptance.     Dental advisory given  Plan Discussed with: CRNA  Anesthesia Plan Comments:        Anesthesia  Quick Evaluation

## 2024-05-17 NOTE — Pre-Procedure Instructions (Signed)
 Instructed patient on the following items: Arrival time 1100 Nothing to eat or drink after midnight No meds AM of procedure Responsible person to drive you home and stay with you for 24 hrs  Have you missed any doses of anti-coagulant Eliquis - takes twice a day, hasn't missed any doses.  Don't take dose morning of procedure.

## 2024-05-18 ENCOUNTER — Other Ambulatory Visit: Payer: Self-pay

## 2024-05-18 ENCOUNTER — Encounter (HOSPITAL_COMMUNITY)
Admission: RE | Disposition: A | Discharge status: Home / Self Care | Payer: Self-pay | Source: Home / Self Care | Attending: Cardiology

## 2024-05-18 ENCOUNTER — Ambulatory Visit (HOSPITAL_COMMUNITY)
Admission: RE | Admit: 2024-05-18 | Discharge: 2024-05-18 | Disposition: A | Discharge status: Home / Self Care | Payer: 59 | Attending: Cardiology | Admitting: Cardiology

## 2024-05-18 ENCOUNTER — Ambulatory Visit (HOSPITAL_COMMUNITY): Admitting: Anesthesiology

## 2024-05-18 ENCOUNTER — Ambulatory Visit (HOSPITAL_BASED_OUTPATIENT_CLINIC_OR_DEPARTMENT_OTHER): Admitting: Anesthesiology

## 2024-05-18 DIAGNOSIS — K219 Gastro-esophageal reflux disease without esophagitis: Secondary | ICD-10-CM | POA: Insufficient documentation

## 2024-05-18 DIAGNOSIS — I4819 Other persistent atrial fibrillation: Secondary | ICD-10-CM

## 2024-05-18 DIAGNOSIS — I110 Hypertensive heart disease with heart failure: Secondary | ICD-10-CM | POA: Insufficient documentation

## 2024-05-18 DIAGNOSIS — J449 Chronic obstructive pulmonary disease, unspecified: Secondary | ICD-10-CM | POA: Insufficient documentation

## 2024-05-18 DIAGNOSIS — Z79899 Other long term (current) drug therapy: Secondary | ICD-10-CM | POA: Diagnosis not present

## 2024-05-18 DIAGNOSIS — Z87891 Personal history of nicotine dependence: Secondary | ICD-10-CM

## 2024-05-18 DIAGNOSIS — I4891 Unspecified atrial fibrillation: Secondary | ICD-10-CM | POA: Diagnosis not present

## 2024-05-18 DIAGNOSIS — I5022 Chronic systolic (congestive) heart failure: Secondary | ICD-10-CM | POA: Insufficient documentation

## 2024-05-18 HISTORY — PX: ATRIAL FIBRILLATION ABLATION: EP1191

## 2024-05-18 LAB — POCT ACTIVATED CLOTTING TIME: Activated Clotting Time: 262 s

## 2024-05-18 SURGERY — ATRIAL FIBRILLATION ABLATION
Anesthesia: General

## 2024-05-18 MED ORDER — HEPARIN (PORCINE) IN NACL 1000-0.9 UT/500ML-% IV SOLN
INTRAVENOUS | Status: DC | PRN
  Administered 2024-05-18 (×3): 500 mL

## 2024-05-18 MED ORDER — SODIUM CHLORIDE 0.9 % IV SOLN: 250.0000 mL | INTRAVENOUS | Status: DC | PRN

## 2024-05-18 MED ORDER — ACETAMINOPHEN 325 MG PO TABS: 650.0000 mg | ORAL_TABLET | ORAL | Status: DC | PRN

## 2024-05-18 MED ORDER — PROPOFOL 10 MG/ML IV BOLUS
INTRAVENOUS | Status: DC | PRN
  Administered 2024-05-18: 120 mg via INTRAVENOUS

## 2024-05-18 MED ORDER — LIDOCAINE 2% (20 MG/ML) 5 ML SYRINGE
INTRAMUSCULAR | Status: DC | PRN
  Administered 2024-05-18: 70 mg via INTRAVENOUS

## 2024-05-18 MED ORDER — FENTANYL CITRATE (PF) 250 MCG/5ML IJ SOLN
INTRAMUSCULAR | Status: DC | PRN
  Administered 2024-05-18: 100 ug via INTRAVENOUS

## 2024-05-18 MED ORDER — ROCURONIUM BROMIDE 10 MG/ML (PF) SYRINGE
PREFILLED_SYRINGE | INTRAVENOUS | Status: DC | PRN
  Administered 2024-05-18: 60 mg via INTRAVENOUS

## 2024-05-18 MED ORDER — HEPARIN SODIUM (PORCINE) 1000 UNIT/ML IJ SOLN
INTRAMUSCULAR | Status: DC | PRN
  Administered 2024-05-18: 14000 [IU] via INTRAVENOUS
  Administered 2024-05-18: 6000 [IU] via INTRAVENOUS

## 2024-05-18 MED ORDER — PROTAMINE SULFATE 10 MG/ML IV SOLN
INTRAVENOUS | Status: DC | PRN
  Administered 2024-05-18: 40 mg via INTRAVENOUS

## 2024-05-18 MED ORDER — SODIUM CHLORIDE 0.9% FLUSH: 3.0000 mL | INTRAVENOUS | Status: DC | PRN

## 2024-05-18 MED ORDER — ACETAMINOPHEN 500 MG PO TABS
ORAL_TABLET | ORAL | Status: AC
  Administered 2024-05-18: 1000 mg via ORAL
  Filled 2024-05-18: qty 2

## 2024-05-18 MED ORDER — ONDANSETRON HCL 4 MG/2ML IJ SOLN: 4.0000 mg | Freq: Four times a day (QID) | INTRAMUSCULAR | Status: DC | PRN

## 2024-05-18 MED ORDER — SODIUM CHLORIDE 0.9 % IV SOLN: INTRAVENOUS | Status: DC

## 2024-05-18 MED ORDER — ATROPINE SULFATE 1 MG/10ML IJ SOSY
PREFILLED_SYRINGE | INTRAMUSCULAR | Status: DC | PRN
Start: 2024-05-18 — End: 2024-05-18
  Administered 2024-05-18: 1 mg via INTRAVENOUS

## 2024-05-18 MED ORDER — PHENYLEPHRINE 80 MCG/ML (10ML) SYRINGE FOR IV PUSH (FOR BLOOD PRESSURE SUPPORT)
PREFILLED_SYRINGE | INTRAVENOUS | Status: DC | PRN
  Administered 2024-05-18 (×2): 80 ug via INTRAVENOUS

## 2024-05-18 MED ORDER — ACETAMINOPHEN 500 MG PO TABS: 1000.0000 mg | ORAL_TABLET | Freq: Once | ORAL | Status: AC

## 2024-05-18 MED ORDER — SUGAMMADEX SODIUM 200 MG/2ML IV SOLN
INTRAVENOUS | Status: DC | PRN
  Administered 2024-05-18: 200 mg via INTRAVENOUS

## 2024-05-18 MED ORDER — ONDANSETRON HCL 4 MG/2ML IJ SOLN
INTRAMUSCULAR | Status: DC | PRN
  Administered 2024-05-18: 4 mg via INTRAVENOUS

## 2024-05-18 NOTE — H&P (Signed)
  Electrophysiology Office Note:   Date:  05/18/2024  ID:  Hannibal Skalla, DOB [birth year], MRN [id number]  Primary Cardiologist: None Primary Heart Failure: Peder Bourdon, MD Electrophysiologist: Rashell Shambaugh Cortland Ding, MD      History of Present Illness:   Caleb Barnes is a 68 y.o. male with h/o tobacco and alcohol abuse, atrial fibrillation, chronic systolic heart failure seen today for  for Electrophysiology evaluation of atrial fibrillation at the request of Peder Bourdon.    Today, denies symptoms of palpitations, chest pain, shortness of breath, orthopnea, PND, lower extremity edema, claudication, dizziness, presyncope, syncope, bleeding, or neurologic sequela. The patient is tolerating medications without difficulties. Plan ablation today.   EP Information / Studies Reviewed:    EKG is ordered today. Personal review as below.        Risk Assessment/Calculations:    CHA2DS2-VASc Score = 2   This indicates a 2.2% annual risk of stroke. The patient's score is based upon: CHF History: 1 HTN History: 0 Diabetes History: 0 Stroke History: 0 Vascular Disease History: 0 Age Score: 1 Gender Score: 0            Physical Exam:   VS:  BP (!) 150/80   Pulse 65   Temp 98.2 F (36.8 C) (Oral)   Resp 20   Ht 5\' 10"  (1.778 m)   Wt 76.2 kg   SpO2 98%   BMI 24.11 kg/m    Wt Readings from Last 3 Encounters:  05/18/24 76.2 kg  02/11/24 77.6 kg  01/20/24 80.4 kg     GEN: No acute distress.   Neck: No JVD Cardiac: RRR, no murmurs, rubs, or gallops.  Respiratory: normal BS bilaterally. GI: Soft, nontender, non-distended  MS: No edema; No deformity. Neuro:  Nonfocal  Skin: warm and dr Fleming Hum: Normal affect    ASSESSMENT AND PLAN:    1.  Persistent atrial fibrillation: Jorden Minchey has presented today for surgery, with the diagnosis of AF.  The various methods of treatment have been discussed with the patient and family. After consideration of risks, benefits and other options for  treatment, the patient has consented to  Procedure(s): Catheter ablation as a surgical intervention .  Risks include but not limited to complete heart block, stroke, esophageal damage, nerve damage, bleeding, vascular damage, tamponade, perforation, MI, and death. The patient's history has been reviewed, patient examined, no change in status, stable for surgery.  I have reviewed the patient's chart and labs.  Questions were answered to the patient's satisfaction.    Carter Kaman Lawana Pray, MD 05/18/2024 12:23 PM

## 2024-05-18 NOTE — Anesthesia Postprocedure Evaluation (Signed)
 Anesthesia Post Note  Patient: Caleb Barnes  Procedure(s) Performed: ATRIAL FIBRILLATION ABLATION     Patient location during evaluation: PACU Anesthesia Type: General Level of consciousness: sedated and patient cooperative Pain management: pain level controlled Vital Signs Assessment: post-procedure vital signs reviewed and stable Respiratory status: spontaneous breathing Cardiovascular status: stable Anesthetic complications: no   There were no known notable events for this encounter.  Last Vitals:  Vitals:   05/18/24 1630 05/18/24 1700  BP: 113/67 111/71  Pulse: (!) 57 (!) 59  Resp: 12 14  Temp:    SpO2: 94% 94%    Last Pain:  Vitals:   05/18/24 1455  TempSrc: Oral  PainSc: 0-No pain                 Gorman Laughter

## 2024-05-18 NOTE — Transfer of Care (Signed)
 Immediate Anesthesia Transfer of Care Note  Patient: Caleb Barnes  Procedure(s) Performed: ATRIAL FIBRILLATION ABLATION  Patient Location: Cath Lab  Anesthesia Type:General  Level of Consciousness: drowsy and patient cooperative  Airway & Oxygen Therapy: Patient Spontanous Breathing  Post-op Assessment: Report given to RN and Post -op Vital signs reviewed and stable  Post vital signs: Reviewed and stable  Last Vitals:  Vitals Value Taken Time  BP 105/63 05/18/24 1425  Temp 36.2 C 05/18/24 1424  Pulse 74 05/18/24 1425  Resp 11 05/18/24 1425  SpO2 91 % 05/18/24 1425  Vitals shown include unfiled device data.  Last Pain:  Vitals:   05/18/24 1424  TempSrc:   PainSc: 0-No pain         Complications: There were no known notable events for this encounter.

## 2024-05-18 NOTE — Anesthesia Procedure Notes (Signed)
 Procedure Name: Intubation Date/Time: 05/18/2024 1:12 PM  Performed by: Katrinka Parr, CRNAPre-anesthesia Checklist: Patient identified, Emergency Drugs available, Suction available and Patient being monitored Patient Re-evaluated:Patient Re-evaluated prior to induction Oxygen Delivery Method: Circle System Utilized Preoxygenation: Pre-oxygenation with 100% oxygen Induction Type: IV induction Ventilation: Mask ventilation without difficulty Laryngoscope Size: Mac and 4 Grade View: Grade I Tube type: Oral Tube size: 7.5 mm Number of attempts: 1 Airway Equipment and Method: Stylet and Oral airway Placement Confirmation: ETT inserted through vocal cords under direct vision, positive ETCO2 and breath sounds checked- equal and bilateral Secured at: 23 cm Tube secured with: Tape Dental Injury: Teeth and Oropharynx as per pre-operative assessment

## 2024-05-18 NOTE — Discharge Instructions (Signed)

## 2024-05-19 ENCOUNTER — Telehealth (HOSPITAL_COMMUNITY): Payer: Self-pay

## 2024-05-19 ENCOUNTER — Encounter (HOSPITAL_COMMUNITY): Payer: Self-pay | Admitting: Cardiology

## 2024-05-19 ENCOUNTER — Other Ambulatory Visit (HOSPITAL_COMMUNITY): Payer: Self-pay | Admitting: Cardiology

## 2024-05-19 NOTE — Telephone Encounter (Signed)
 Attempted to reach patient to follow up with procedure completed on 05/18/24, no answer. Left VM for patient to return call.

## 2024-05-19 NOTE — Telephone Encounter (Signed)
 Patient returned call to complete post procedure follow up.  Patient reports no complications with groin sites.   Instructions reviewed with patient:  Remove large bandage at puncture site after 24 hours. It is normal to have bruising, tenderness and a pea or marble sized lump/knot at the groin site which can take up to three months to resolve.  Get help right away if you notice sudden swelling at the puncture site.  Check your puncture site every day for signs of infection: fever, redness, swelling, pus drainage, warmth, foul odor or excessive pain. If this occurs, please call the office at [phone], to speak with the nurse. Get help right away if your puncture site is bleeding and the bleeding does not stop after applying firm pressure to the area.  You may continue to have skipped beats/ atrial fibrillation during the first several months after your procedure.  It is very important not to miss any doses of your blood thinner Eliquis . Patient restarted taking this medication.   You will follow up with the APP on 06/16/24 and follow up with the APP on 08/22/24.   Patient verbalized understanding to all instructions provided.

## 2024-05-26 ENCOUNTER — Encounter: Payer: Self-pay | Admitting: Emergency Medicine

## 2024-06-12 ENCOUNTER — Other Ambulatory Visit: Payer: Self-pay | Admitting: Gastroenterology

## 2024-06-12 DIAGNOSIS — R1319 Other dysphagia: Secondary | ICD-10-CM

## 2024-06-15 ENCOUNTER — Encounter: Payer: Self-pay | Admitting: Cardiology

## 2024-06-15 ENCOUNTER — Ambulatory Visit (HOSPITAL_COMMUNITY): Admitting: Physician Assistant

## 2024-06-15 NOTE — Progress Notes (Unsigned)
 Electrophysiology Clinic Note    Date:  06/16/2024  Patient ID:  Caleb Barnes, DOB 12-Jul-1956, MRN [id number] PCP:  Patient, No Pcp Per  Cardiologist:  None HF cardiologist - Mitzie Anda Electrophysiologist: Will Cortland Ding, MD   Discussed the use of AI scribe software for clinical note transcription with the patient, who gave verbal consent to proceed.   Patient Profile    Chief Complaint: AF ablation follow-up  History of Present Illness: Caleb Barnes is a 68 y.o. male with PMH notable for parox AFib, HFimpEF, non-obs CAD, OSA on CPAP, ETOH abuse, tobacco abuse; seen today for Will Cortland Ding, MD for routine electrophysiology follow-up s/p Ablation.  Historically, has reduced LVEF associated with AFib, with normalization of EF in sinus rhythm.  He is s/p AF ablation with isolation of pulm veins and posterior wall on 05/18/2024 by Dr. Lawana Pray.   On follow-up today, he is not aware of any A-fib episodes.  Prior to his ablation he had shortness of breath with activity and he has not had any of that since ablation.  He stopped his amiodarone  prior to the ablation because of dizziness.  His dizziness has also resolved.  He feels that he is in a good spot volume wise, weight is stable around 168 pounds when he weighs himself at home. He continues to take Eliquis  twice a day without bleeding concerns.  He denies chest pain, chest pressure, palpitations, lightheadedness, dizziness.  He works as a Scientist, product/process development, unfortunately losing his job and health insurance at the end of September due to outsourcing.     Arrhythmia/Device History Amiodarone  - stopped d/t dizziness    ROS:  Please see the history of present illness. All other systems are reviewed and otherwise negative.    Physical Exam    VS:  BP 130/68 (BP Location: Left Arm, Patient Position: Sitting, Cuff Size: Normal)   Pulse 70   Ht 5' 10 (1.778 m)   Wt 171 lb 9.6 oz (77.8 kg)   SpO2 96%   BMI 24.62  kg/m  BMI: Body mass index is 24.62 kg/m.  Wt Readings from Last 3 Encounters:  06/16/24 171 lb 9.6 oz (77.8 kg)  05/18/24 168 lb (76.2 kg)  02/11/24 171 lb (77.6 kg)     GEN- The patient is well appearing, alert and oriented x 3 today.   Lungs- Clear to ausculation bilaterally, normal work of breathing.  Heart- Regular rate and rhythm, no murmurs, rubs or gallops Extremities- No peripheral edema, warm, dry    Studies Reviewed   Previous EP, cardiology notes.    EKG is ordered. Personal review of EKG from today shows:    EKG Interpretation Date/Time:  Thursday June 16 2024 09:59:09 EDT Ventricular Rate:  70 PR Interval:  190 QRS Duration:  102 QT Interval:  398 QTC Calculation: 429 R Axis:   -7  Text Interpretation: Normal sinus rhythm Normal ECG Confirmed by Katrice Goel [phone]) on 06/16/2024 10:00:47 AM     Cardiac CTA, 05/05/2024 1. There is normal pulmonary vein drainage into the left atrium. (3 on the right and 2 on the left) with ostial measurements as above.  2. The left atrial appendage is a Windsock type with two lobes and ostial size 24 x 11 mm and length 32 mm, Area 21 mm2. There is no thrombus in the left atrial appendage.  3. The esophagus runs in the left atrial midline and is not in the proximity to any of the pulmonary  veins.  4. Calcium  score of 212.  This is 63rd percentile for age/gender.  TTE, 01/20/2024  1. Left ventricular ejection fraction, by estimation, is 55%. The left ventricle has normal function. The left ventricle has no regional wall motion abnormalities. Left ventricular diastolic parameters are indeterminate.   2. Right ventricular systolic function is normal. The right ventricular size is normal. There is normal pulmonary artery systolic pressure. The estimated right ventricular systolic pressure is 28.4 mmHg.   3. Left atrial size was mildly dilated.   4. Right atrial size was mildly dilated.   5. The mitral valve is normal in structure.  Trivial mitral valve regurgitation. No evidence of mitral stenosis.   6. The aortic valve is tricuspid. Aortic valve regurgitation is not visualized. No aortic stenosis is present.   7. The inferior vena cava is normal in size with greater than 50% respiratory variability, suggesting right atrial pressure of 3 mmHg.   8. The patient was in atrial fibrillation.   TTE, 10/11/2020 LVEF 60-65%  TTE, 03/31/2016 LVEF 20-25%   Assessment and Plan     #) parox AFib #) amiodarone  monitoring Status post A-fib ablation 04/2024 by Dr. Lawana Pray He self stopped amiodarone , do not recommend restarting He is maintaining sinus rhythm since ablation Continue 75mg  toprol  daily  #) Hypercoag d/t parox afib CHA2DS2-VASc Score = at least 3 [CHF History: 1, HTN History: 0, Diabetes History: 0, Stroke History: 0, Vascular Disease History: 1, Age Score: 1, Gender Score: 0].  Therefore, the patient's annual risk of stroke is 3.2 %.    Stroke ppx - 5mg  eliquis  BID, appropriately dosed No bleeding concerns   #) HFimpEF appears euvolemic on exam, warm and dry Follows regularly with Dr. Mitzie Anda, due for follow-up Continue 9-51 Entresto , metoprolol  as above, 12.5 mg Spiro         Current medicines are reviewed at length with the patient today.   The patient does not have concerns regarding his medicines.  The following changes were made today:   Stop amiodarone   Labs/ tests ordered today include:  Orders Placed This Encounter  Procedures   EKG 12-Lead     Disposition: Follow up with Dr. Lawana Pray or EP APP in 2 months for routine ablation follow-up   Follow-up with Dr. Mitzie Anda at next available for routine HF follow-up   Signed, Adaline Holly, NP  06/16/24  10:29 AM  Electrophysiology [facility]

## 2024-06-16 ENCOUNTER — Ambulatory Visit: Attending: Cardiology | Admitting: Cardiology

## 2024-06-16 VITALS — BP 130/68 | HR 70 | Ht 70.0 in | Wt 171.6 lb

## 2024-06-16 DIAGNOSIS — Z5181 Encounter for therapeutic drug level monitoring: Secondary | ICD-10-CM

## 2024-06-16 DIAGNOSIS — Z79899 Other long term (current) drug therapy: Secondary | ICD-10-CM

## 2024-06-16 DIAGNOSIS — I480 Paroxysmal atrial fibrillation: Secondary | ICD-10-CM | POA: Diagnosis not present

## 2024-06-16 DIAGNOSIS — D6869 Other thrombophilia: Secondary | ICD-10-CM

## 2024-06-16 DIAGNOSIS — I5032 Chronic diastolic (congestive) heart failure: Secondary | ICD-10-CM | POA: Diagnosis not present

## 2024-06-16 NOTE — Patient Instructions (Signed)
  Follow-Up: At [facility], you and your health needs are our priority.  As part of our continuing mission to provide you with exceptional heart care, our providers are all part of one team.  This team includes your primary Cardiologist (physician) and Advanced Practice Providers or APPs (Physician Assistants and Nurse Practitioners) who all work together to provide you with the care you need, when you need it.  Your next appointment:   As scheduled with Letisia Schwalb, NP  We recommend signing up for the patient portal called MyChart.  Sign up information is provided on this After Visit Summary.  MyChart is used to connect with patients for Virtual Visits (Telemedicine).  Patients are able to view lab/test results, encounter notes, upcoming appointments, etc.  Non-urgent messages can be sent to your provider as well.   To learn more about what you can do with MyChart, go to [email or web address].   Other Instructions  Schedule appointment with Dr. Mitzie Anda in [facility] Heart Failure clinic at next available

## 2024-06-23 ENCOUNTER — Other Ambulatory Visit: Payer: Self-pay | Admitting: Gastroenterology

## 2024-06-23 DIAGNOSIS — R1319 Other dysphagia: Secondary | ICD-10-CM

## 2024-07-06 ENCOUNTER — Telehealth: Payer: Self-pay | Admitting: Family

## 2024-07-06 NOTE — Telephone Encounter (Signed)
 Called to confirm/remind patient of their appointment at the Advanced Heart Failure Clinic on 07/07/24.   Appointment:   [] Confirmed  [x] Left mess   [] No answer/No voice mail  [] VM Full/unable to leave message  [] Phone not in service  Patient reminded to bring all medications and/or complete list.  Confirmed patient has transportation. Gave directions, instructed to utilize valet parking.

## 2024-07-07 ENCOUNTER — Other Ambulatory Visit
Admission: RE | Admit: 2024-07-07 | Discharge: 2024-07-07 | Disposition: A | Discharge status: Home / Self Care | Source: Ambulatory Visit | Attending: Cardiology | Admitting: Cardiology

## 2024-07-07 ENCOUNTER — Ambulatory Visit (HOSPITAL_BASED_OUTPATIENT_CLINIC_OR_DEPARTMENT_OTHER): Admitting: Cardiology

## 2024-07-07 VITALS — BP 131/69 | HR 69 | Wt 171.0 lb

## 2024-07-07 DIAGNOSIS — I2510 Atherosclerotic heart disease of native coronary artery without angina pectoris: Secondary | ICD-10-CM | POA: Insufficient documentation

## 2024-07-07 DIAGNOSIS — Z79899 Other long term (current) drug therapy: Secondary | ICD-10-CM | POA: Insufficient documentation

## 2024-07-07 DIAGNOSIS — I5022 Chronic systolic (congestive) heart failure: Secondary | ICD-10-CM | POA: Insufficient documentation

## 2024-07-07 DIAGNOSIS — Z87891 Personal history of nicotine dependence: Secondary | ICD-10-CM | POA: Diagnosis not present

## 2024-07-07 DIAGNOSIS — E785 Hyperlipidemia, unspecified: Secondary | ICD-10-CM | POA: Insufficient documentation

## 2024-07-07 DIAGNOSIS — I480 Paroxysmal atrial fibrillation: Secondary | ICD-10-CM

## 2024-07-07 DIAGNOSIS — Z7901 Long term (current) use of anticoagulants: Secondary | ICD-10-CM | POA: Insufficient documentation

## 2024-07-07 DIAGNOSIS — I428 Other cardiomyopathies: Secondary | ICD-10-CM | POA: Diagnosis not present

## 2024-07-07 DIAGNOSIS — I5032 Chronic diastolic (congestive) heart failure: Secondary | ICD-10-CM | POA: Diagnosis not present

## 2024-07-07 LAB — BASIC METABOLIC PANEL WITH GFR
Anion gap: 11 (ref 5–15)
BUN: 21 mg/dL (ref 8–23)
CO2: 28 mmol/L (ref 22–32)
Calcium: 9.6 mg/dL (ref 8.9–10.3)
Chloride: 100 mmol/L (ref 98–111)
Creatinine, Ser: 1.2 mg/dL (ref 0.61–1.24)
GFR, Estimated: >60 mL/min (ref >60)
Glucose, Bld: 91 mg/dL (ref 70–99)
Potassium: 3.8 mmol/L (ref 3.5–5.1)
Sodium: 139 mmol/L (ref 135–145)

## 2024-07-07 LAB — BRAIN NATRIURETIC PEPTIDE: B Natriuretic Peptide: 30 pg/mL (ref 0.0–100.0)

## 2024-07-07 NOTE — Patient Instructions (Signed)
 Medication Changes:  No medication changes today!  Lab Work: Go over to the [facility]. Go pass the gift shop and have your blood work completed.  We will only call you if the results are abnormal or if the provider would like to make medication changes.  Follow-Up in: Please follow up with the Advanced [facility] Failure Clinic in 6 months with Dr. Rolan.   At the [facility], you and your health needs are our priority. We have a designated team specialized in the treatment of Heart Failure. This Care Team includes your primary Heart Failure Specialized Cardiologist (physician), Advanced Practice Providers (APPs- Physician Assistants and Nurse Practitioners), and Pharmacist who all work together to provide you with the care you need, when you need it.   You may see any of the following providers on your designated Care Team at your next follow up:  Dr. Toribio Fuel Dr. Ezra Rolan Dr. Ria Commander Dr. Odis Brownie Ellouise Class, FNP Jaun Bash, RPH-CPP  Please be sure to bring in all your medications bottles to every appointment.   Need to Contact Us :  If you have any questions or concerns before your next appointment please send us  a message through [place] or call our office at [phone].    TO LEAVE A MESSAGE FOR THE NURSE SELECT OPTION 2, PLEASE LEAVE A MESSAGE INCLUDING: YOUR NAME DATE OF BIRTH CALL BACK NUMBER REASON FOR CALL**this is important as we prioritize the call backs  YOU WILL RECEIVE A CALL BACK THE SAME DAY AS LONG AS YOU CALL BEFORE 4:00 PM

## 2024-07-07 NOTE — Progress Notes (Signed)
 Patient ID: Caleb Barnes, male   DOB: 1956-11-18, 69 y.o.   MRN: [id number]    Advanced Heart Failure Clinic Note   HF Cardiology: Dr. Rolan   Chief complaint: Atrial fibrillation  Caleb Barnes is a 68 y.o. male with h/o tobacco abuse and ETOH abuse who presented to [facility] ED on 03/31/16 with SOB. BNP and LFTs were elevated. Was found to be in atrial fibrillation with RVR. Echo 03/31/16 with reduced EF to 20-25%, mild MR, mild/mod reduced RV systolic function, Peak PA systolic 43 mm Hg.  CTA chest with no PE. He was also placed on CIWA protocol for extensive ETOH use as an outpatient. GI consulted with elevated LFTs at [facility]. Abd US  with gallbladder thickening but normal-appearing liver. He was started on amiodarone  drip for atrial fibrillation. Diuresed with IV lasix  40 mg BID.  He was started on Dobutamine  and Dopamine  drips. Was transferred to [facility] for further evaluation and treatment with worsening condition.   He was weaned off both dopamine  and dobutamine  in CCU and went back into NSR.  [facility] 04/04/16 showed low CO, mildly elevated filling pressures, and mild nonobstructive CAD. Started on milrinone  with low cardiac output. 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  He tolerated milrinone  wean and was discharged to home without inotropic support and in NSR.  Repeat echo in 8/17 showed EF up to 55-60%. Echo 8/18 showed EF 50-55%.    Echo in 7/19 showed EF 55-60%.  Echo in 10/21 showed EF 60-65%, normal.   Patient went back into atrial fibrillation, had DCCV in 12/24 to NSR.   Echo in 1/25 showed EF 55%, normal RV, mild biatrial enlargement.   Patient had AF ablation in 5/25.  He is now off amiodarone .   He presents today for followup of CHF and atrial fibrillation. He is in NSR today.  No palpitations.  No significant exertional dyspnea or chest pain.  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  No lightheadedness. Weight down 6 lbs.   ECG (personally reviewed): NSR, normal  Labs (11/23): K 4.2, creatinine 1.44 Labs (12/24): LDL 96, hgb 13.8, TSH normal, K 4.9, creatinine 1.33 Labs (1/25): LDL 43, K 4.4, creatinine 8.78  PMH 1. Chronic systolic CHF: Nonischemic cardiomyopathy.  Possible etiologies include tachy-mediated CMP with atrial fib/RVR of unknown duration and ETOH CMP.  - R/LHC (4/17): Mild nonobstructive CAD; mean RA 7, PA 43/25, mean PCWP 21, CI 1.41 Fick and 1.78 thermodilution.  - Echo (4/17): EF < 20%, moderate LV dilation, mid to moderately decreased RV systolic function.  - Echo (8/17): EF 55-60%, moderate LVH, normal RV size and systolic function.  - Echo (8/18): EF 50-55%, PASP 28 mmHg - Echo (7/19): EF 55-60%, normal.  - Echo (7/21): EF 60-65%, normal - Echo (1/25): EF 55%, normal RV, mild biatrial enlargement. 2. Atrial fibrillation: Paroxysmal. - DCCV in 12/24 - AF ablation in 5/25 3. ETOH abuse: Has quit.  4. COPD  Current Outpatient Medications  Medication Sig Dispense Refill   apixaban  (ELIQUIS ) 5 MG TABS tablet Take 1 tablet (5 mg total) by mouth 2 (two) times daily. 180 tablet 3   ibuprofen (ADVIL) 200 MG tablet Take 400 mg by mouth every 6 (six) hours as needed for moderate pain (pain score 4-6).     metoprolol  succinate (TOPROL -XL) 50 MG 24 hr tablet Take 1.5 tablets (75 mg total)  by mouth daily. 135 tablet 3   naproxen sodium (ALEVE) 220 MG tablet Take 220 mg by mouth daily as needed (pain).     omeprazole  (PRILOSEC) 40 MG capsule TAKE 1 CAPSULE (40 MG TOTAL) BY MOUTH 2 (TWO) TIMES DAILY BEFORE A MEAL. 180 capsule 3   rosuvastatin  (CRESTOR ) 20 MG tablet Take 1 tablet (20 mg total) by mouth daily. 90 tablet 3   sacubitril -valsartan  (ENTRESTO ) 49-51 MG Take 1 tablet by mouth 2 (two) times daily. 180 tablet 3   spironolactone  (ALDACTONE ) 25 MG tablet TAKE 0.5 TABLETS (12.5 MG TOTAL) BY MOUTH DAILY. PLEASE CALL FOR OFFICE VISIT  [phone] 45 tablet 3   No current facility-administered medications for this visit.    Allergies  Allergen Reactions   Bee Venom Swelling    redness      Social History   Socioeconomic History   Marital status: Divorced    Spouse name: Not on file   Number of children: Not on file   Years of education: Not on file   Highest education level: Not on file  Occupational History   Not on file  Tobacco Use   Smoking status: Former    Current packs/day: 0.00    Average packs/day: 2.0 packs/day for 50.0 years (100.0 ttl pk-yrs)    Types: Cigarettes    Start date: 56    Quit date: 2014    Years since quitting: 11.5   Smokeless tobacco: Never  Vaping Use   Vaping status: Never Used  Substance and Sexual Activity   Alcohol use: Yes    Alcohol/week: 3.0 standard drinks of alcohol    Types: 3 Cans of beer per week   Drug use: Yes    Frequency: 2.0 times per week    Types: Marijuana   Sexual activity: Not on file  Other Topics Concern   Not on file  Social History Narrative   Not on file   Social Drivers of Health   Financial Resource Strain: Not on file  Food Insecurity: Not on file  Transportation Needs: Not on file  Physical Activity: Not on file  Stress: Not on file  Social Connections: Not on file  Intimate Partner Violence: Not on file      Family History  Problem Relation Age of Onset   CAD Father    Depression Father    COPD Mother    Hypertension Mother    Review of systems complete and found to be negative unless listed in HPI.   Vitals:   07/07/24 1554  BP: 131/69  Pulse: 69  SpO2: 95%  Weight: 171 lb (77.6 kg)   Wt Readings from Last 3 Encounters:  07/07/24 171 lb (77.6 kg)  06/16/24 171 lb 9.6 oz (77.8 kg)  05/18/24 168 lb (76.2 kg)     PHYSICAL EXAM: General: NAD Neck: No JVD, no thyromegaly or thyroid  nodule.  Lungs: Clear to auscultation bilaterally with normal respiratory effort. CV: Nondisplaced PMI.  Heart regular S1/S2,  no S3/S4, no murmur.  No peripheral edema.  No carotid bruit.  Normal pedal pulses.  Abdomen: Soft, nontender, no hepatosplenomegaly, no distention.  Skin: Intact without lesions or rashes.  Neurologic: Alert and oriented x 3.  Psych: Normal affect. Extremities: No clubbing or cyanosis.  HEENT: Normal.   ASSESSMENT & PLAN:  1. Chronic HF with recovered EF: Nonischemic cardiomyopathy.  I suspect that this was a combination of tachy-mediated CMP + ETOH CMP.  He has quit drinking.  Echo in 8/18  showed EF 50-55%.  Echo in 1/25 showed EF 55% with normal RV even though he was back in atrial fibrillation.  NYHA class I, not volume overloaded on exam.  With history of tachy-mediated CMP, need to keep him in NSR.  - Continue current Entresto  and spironolactone .   - Continue Toprol  XL 75 mg daily.  - Check BMET/BNP today and will need BMET every 3 months.  2. Atrial fibrillation: Paroxysmal.  He did not tolerate amiodarone  well.  He had ablation in 5/25 and is in NSR today.  - Continue apixaban .  CBC today.  3. Hyperlipidemia: Good lipids on Crestor  in 1/25.   Followup in 6 months.  I spent 22 minutes reviewing records, interviewing/examining patient, and managing orders.   Caleb Barnes 07/07/2024

## 2024-07-10 ENCOUNTER — Ambulatory Visit (HOSPITAL_COMMUNITY): Payer: Self-pay | Admitting: Cardiology

## 2024-08-22 ENCOUNTER — Encounter: Payer: Self-pay | Admitting: Cardiology

## 2024-08-22 ENCOUNTER — Ambulatory Visit: Attending: Cardiology | Admitting: Cardiology

## 2024-08-22 ENCOUNTER — Ambulatory Visit (HOSPITAL_COMMUNITY): Admitting: Physician Assistant

## 2024-08-22 VITALS — BP 116/68 | HR 75 | Ht 70.0 in | Wt 173.0 lb

## 2024-08-22 DIAGNOSIS — D6869 Other thrombophilia: Secondary | ICD-10-CM

## 2024-08-22 DIAGNOSIS — I480 Paroxysmal atrial fibrillation: Secondary | ICD-10-CM | POA: Diagnosis not present

## 2024-08-22 DIAGNOSIS — I5032 Chronic diastolic (congestive) heart failure: Secondary | ICD-10-CM | POA: Diagnosis not present

## 2024-08-22 NOTE — Patient Instructions (Signed)
 Medication Instructions:  Your physician recommends that you continue on your current medications as directed. Please refer to the Current Medication list given to you today.    *If you need a refill on your cardiac medications before your next appointment, please call your pharmacy*  Lab Work: No labs ordered today    Testing/Procedures: No test ordered today   Follow-Up: At [facility], you and your health needs are our priority.  As part of our continuing mission to provide you with exceptional heart care, our providers are all part of one team.  This team includes your primary Cardiologist (physician) and Advanced Practice Providers or APPs (Physician Assistants and Nurse Practitioners) who all work together to provide you with the care you need, when you need it.  Your next appointment:   As needed   Provider:   Suzann Riddle, NP

## 2024-08-22 NOTE — Progress Notes (Signed)
 Electrophysiology Clinic Note    Date:  08/22/2024  Patient ID:  Caleb Barnes, DOB 04-15-1956, MRN [id number] PCP:  Patient, No Pcp Per  Cardiologist:  None   Electrophysiologist:  Will Gladis Norton, MD  Electrophysiology APP:  Itzabella Sorrels, NP  Advanced Heart Failure:  Ezra Shuck, MD     Discussed the use of AI scribe software for clinical note transcription with the patient, who gave verbal consent to proceed.   Patient Profile    Chief Complaint: AF ablation follow-up  History of Present Illness: Caleb Barnes is a 68 y.o. male with PMH notable for  parox AFib, HFimpEF, non-obs CAD, OSA on CPAP, ETOH abuse, tobacco abuse ; seen today for Will Gladis Norton, MD for routine electrophysiology follow-up s/p AF Ablation. He is s/p AF ablation with isolation of pulm veins and posterior wall on 05/18/2024 by Dr. Norton.  I saw him for routine 1 month post-ablation appt where he was doing well without AF episodes.   On follow-up today, he is doing very well without complaints. He has noticed he has more energy in the mornings than before his ablation. He has not had any AF episodes that he is aware of.  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 He is not aware of any increased fluid. He denies chest pain, chest pressure, palpitations. No SOB or presyncope.        Arrhythmia/Device History Amiodarone  - stopped d/t dizziness    ROS:  Please see the history of present illness. All other systems are reviewed and otherwise negative.    Physical Exam    VS:  BP 116/68 (BP Location: Left Arm, Patient Position: Sitting, Cuff Size: Normal)   Pulse 75   Ht 5' 10 (1.778 m)   Wt 173 lb (78.5 kg)   SpO2 97%   BMI 24.82 kg/m  BMI: Body mass index is 24.82 kg/m.      Wt Readings from Last 3 Encounters:  08/22/24 173 lb (78.5 kg)  07/07/24 171 lb (77.6 kg)  06/16/24 171 lb 9.6 oz (77.8 kg)     GEN- The patient is well appearing, alert and oriented  x 3 today.   Lungs- Clear to ausculation bilaterally, normal work of breathing.  Heart- Regular rate and rhythm, no murmurs, rubs or gallops Extremities- Trace peripheral edema, warm, dry   Studies Reviewed   Previous EP, cardiology notes.    EKG is ordered. Personal review of EKG from today shows:    EKG Interpretation Date/Time:  Monday August 22 2024 14:01:31 EDT Ventricular Rate:  75 PR Interval:  172 QRS Duration:  106 QT Interval:  418 QTC Calculation: 466 R Axis:   -4  Text Interpretation: Normal sinus rhythm Confirmed by Karcyn Menn [phone]) on 08/22/2024 2:06:21 PM    Cardiac CTA, 05/05/2024 1. There is normal pulmonary vein drainage into the left atrium. (3 on the right and 2 on the left) with ostial measurements as above.  2. The left atrial appendage is a Windsock type with two lobes and ostial size 24 x 11 mm and length 32 mm, Area 21 mm2. There is no thrombus in the left atrial appendage.  3. The esophagus runs in the left atrial midline and is not in the proximity to any of the pulmonary veins.  4. Calcium  score of 212.  This is 63rd percentile for age/gender.   TTE, 01/20/2024  1. Left ventricular ejection fraction, by estimation, is 55%. The left  ventricle has normal function. The left ventricle has no regional wall motion abnormalities. Left ventricular diastolic parameters are indeterminate.   2. Right ventricular systolic function is normal. The right ventricular size is normal. There is normal pulmonary artery systolic pressure. The estimated right ventricular systolic pressure is 28.4 mmHg.   3. Left atrial size was mildly dilated.   4. Right atrial size was mildly dilated.   5. The mitral valve is normal in structure. Trivial mitral valve regurgitation. No evidence of mitral stenosis.   6. The aortic valve is tricuspid. Aortic valve regurgitation is not visualized. No aortic stenosis is present.   7. The inferior vena cava is normal in size with greater than  50% respiratory variability, suggesting right atrial pressure of 3 mmHg.   8. The patient was in atrial fibrillation.    TTE, 10/11/2020 LVEF 60-65%   TTE, 03/31/2016 LVEF 20-25%     Assessment and Plan     #) parox AFib S/p AF ablation 04/2024 by Dr. Inocencio Maintaining sinus rhythm since procedure   #) Hypercoag d/t parox afib CHA2DS2-VASc Score = at least 3 [CHF History: 1, HTN History: 0, Diabetes History: 0, Stroke History: 0, Vascular Disease History: 1, Age Score: 1, Gender Score: 0].  Therefore, the patient's annual risk of stroke is 3.2 %.    Stroke ppx - 5mg  eliquis  BID, appropriately dosed No bleeding concerns   #) HFimpEF Likely tachy-mediated and ETOH-mediated Most recent TTE with improved LVEF Warm and euvolemic on exam, some trace lower extremity edema Follows regualrly with Dr. Rolan       Current medicines are reviewed at length with the patient today.   The patient does not have concerns regarding his medicines.  The following changes were made today:  none  Labs/ tests ordered today include:  Orders Placed This Encounter  Procedures   EKG 12-Lead     Disposition: Follow up with Dr. Inocencio or EP APP PRN    Signed, Ever Gustafson, NP  08/22/24  2:25 PM  Electrophysiology [facility]

## 2024-09-12 ENCOUNTER — Other Ambulatory Visit (HOSPITAL_COMMUNITY): Payer: Self-pay | Admitting: Cardiology

## 2024-11-21 ENCOUNTER — Telehealth (HOSPITAL_COMMUNITY): Payer: Self-pay | Admitting: Adult Health

## 2024-11-21 NOTE — Progress Notes (Signed)
 Patient ID: Caleb Barnes, male   DOB: 12-Apr-1956, 68 y.o.   MRN: [id number]    Advanced Heart Failure Clinic Note   HF Cardiology: Dr. Rolan   Chief complaint: Heart Failure   Caleb Barnes is a 68 y.o. male with h/o tobacco abuse and ETOH abuse who presented to [facility] ED on 03/31/16 with SOB. BNP and LFTs were elevated. Was found to be in atrial fibrillation with RVR. Echo 03/31/16 with reduced EF to 20-25%, mild MR, mild/mod reduced RV systolic function, Peak PA systolic 43 mm Hg.  CTA chest with no PE. He was also placed on CIWA protocol for extensive ETOH use as an outpatient. GI consulted with elevated LFTs at [facility]. Abd US  with gallbladder thickening but normal-appearing liver. He was started on amiodarone  drip for atrial fibrillation. Diuresed with IV lasix  40 mg BID.  He was started on Dobutamine  and Dopamine  drips. Was transferred to [facility] for further evaluation and treatment with worsening condition.   He was weaned off both dopamine  and dobutamine  in CCU and went back into NSR.  [facility] 04/04/16 showed low CO, mildly elevated filling pressures, and mild nonobstructive CAD. Started on milrinone  with low cardiac output. 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  He tolerated milrinone  wean and was discharged to home without inotropic support and in NSR.  Repeat echo in 8/17 showed EF up to 55-60%. Echo 8/18 showed EF 50-55%.    Echo in 7/19 showed EF 55-60%.  Echo in 10/21 showed EF 60-65%, normal.   Patient went back into atrial fibrillation, had DCCV in 12/24 to NSR.   Echo in 1/25 showed EF 55%, normal RV, mild biatrial enlargement.   Patient had AF ablation in 5/25.  He is now off amiodarone .   Today he returns for HF follow up.Overall feeling fine. Denies SOB/PND/Orthopnea. Appetite ok. No fever or chills. Taking all medications.  He continues to work full time. Plan to retire in January.   PMH 1. Chronic systolic CHF: Nonischemic  cardiomyopathy.  Possible etiologies include tachy-mediated CMP with atrial fib/RVR of unknown duration and ETOH CMP.  - R/LHC (4/17): Mild nonobstructive CAD; mean RA 7, PA 43/25, mean PCWP 21, CI 1.41 Fick and 1.78 thermodilution.  - Echo (4/17): EF < 20%, moderate LV dilation, mid to moderately decreased RV systolic function.  - Echo (8/17): EF 55-60%, moderate LVH, normal RV size and systolic function.  - Echo (8/18): EF 50-55%, PASP 28 mmHg - Echo (7/19): EF 55-60%, normal.  - Echo (7/21): EF 60-65%, normal - Echo (1/25): EF 55%, normal RV, mild biatrial enlargement. 2. Atrial fibrillation: Paroxysmal. - DCCV in 12/24 - AF ablation in 5/25 3. ETOH abuse: Has quit.  4. COPD  Current Outpatient Medications  Medication Sig Dispense Refill   apixaban  (ELIQUIS ) 5 MG TABS tablet Take 1 tablet (5 mg total) by mouth 2 (two) times daily. 180 tablet 3   ibuprofen (ADVIL) 200 MG tablet Take 400 mg by mouth every 6 (six) hours as needed for moderate pain (pain score 4-6).     metoprolol  succinate (TOPROL -XL) 50 MG 24 hr tablet Take 1.5 tablets (75 mg total) by mouth daily. 135 tablet 3   naproxen sodium (ALEVE) 220 MG tablet Take 220 mg by mouth daily as needed (pain).     omeprazole  (PRILOSEC) 40 MG capsule TAKE 1 CAPSULE (40 MG TOTAL) BY MOUTH 2 (TWO) TIMES DAILY BEFORE A MEAL. 180 capsule 3  rosuvastatin  (CRESTOR ) 20 MG tablet TAKE 1 TABLET BY MOUTH EVERY DAY 90 tablet 3   sacubitril -valsartan  (ENTRESTO ) 49-51 MG Take 1 tablet by mouth 2 (two) times daily. 180 tablet 3   spironolactone  (ALDACTONE ) 25 MG tablet TAKE 0.5 TABLETS (12.5 MG TOTAL) BY MOUTH DAILY. PLEASE CALL FOR OFFICE VISIT [phone] 45 tablet 3   No current facility-administered medications for this encounter.    Allergies  Allergen Reactions   Bee Venom Swelling    redness      Social History   Socioeconomic History   Marital status: Divorced    Spouse name: Not on file   Number of children: Not on file   Years  of education: Not on file   Highest education level: Not on file  Occupational History   Not on file  Tobacco Use   Smoking status: Former    Current packs/day: 0.00    Average packs/day: 2.0 packs/day for 50.0 years (100.0 ttl pk-yrs)    Types: Cigarettes    Start date: 36    Quit date: 2014    Years since quitting: 11.9   Smokeless tobacco: Never  Vaping Use   Vaping status: Never Used  Substance and Sexual Activity   Alcohol use: Yes    Alcohol/week: 3.0 standard drinks of alcohol    Types: 3 Cans of beer per week   Drug use: Yes    Frequency: 2.0 times per week    Types: Marijuana   Sexual activity: Not on file  Other Topics Concern   Not on file  Social History Narrative   Not on file   Social Drivers of Health   Financial Resource Strain: Not on file  Food Insecurity: Not on file  Transportation Needs: Not on file  Physical Activity: Not on file  Stress: Not on file  Social Connections: Not on file  Intimate Partner Violence: Not on file      Family History  Problem Relation Age of Onset   CAD Father    Depression Father    COPD Mother    Hypertension Mother    Review of systems complete and found to be negative unless listed in HPI.   Vitals:   11/22/24 0841  BP: 122/76  Pulse: 65  SpO2: 96%  Weight: 79.6 kg (175 lb 6.4 oz)    Wt Readings from Last 3 Encounters:  11/22/24 79.6 kg (175 lb 6.4 oz)  08/22/24 78.5 kg (173 lb)  07/07/24 77.6 kg (171 lb)     PHYSICAL EXAM: General:   No resp difficulty Neck: no JVD.  Cor: Regular rate & rhythm.  Lungs: clear Abdomen: soft, nontender, nondistended.  Extremities: no  edema Neuro: alert & oriented x3  ASSESSMENT & PLAN:  1. Chronic HF with recovered EF: Nonischemic cardiomyopathy.  Suspected combination of tachy-mediated CMP + ETOH CMP.  He has quit drinking.  Echo in 8/18 showed EF 50-55%.  Echo in 1/25 showed EF 55% with normal RV even though he was back in atrial fibrillation.  NYHA I.  Appears euvolemic.   - Continue current Entresto  and spironolactone .   - Continue Toprol  XL 75 mg daily.  - Repeat ECHO in 6 months.  2. Atrial fibrillation: Paroxysmal.  He did not tolerate amiodarone  well.  He had ablation in 5/25.  Regular on exam.  - Continue apixaban .  No bleeding issues.  - needs follow up with EP. We have sent a request.   3. Hyperlipidemia: Good lipids on Crestor  in 1/25.  Refill eliquis  and entresto  today. He has been using mail order but is unsure of the name. He was asked to call back with refills.  Check BMET Follow up in 6 months with Echo and Dr Rolan.      Velinda Wrobel NP-C  11/22/2024

## 2024-11-21 NOTE — Telephone Encounter (Signed)
 Called to confirm/remind patient of their appointment at the Advanced Heart Failure Clinic on 11/21/2024.   Appointment:   [] Confirmed  [x] Left mess   [] No answer/No voice mail  [] VM Full/unable to leave message  [] Phone not in service  Patient reminded to bring all medications and/or complete list.  Confirmed patient has transportation. Gave directions, instructed to utilize valet parking.

## 2024-11-22 ENCOUNTER — Ambulatory Visit (HOSPITAL_COMMUNITY)
Admission: RE | Admit: 2024-11-22 | Discharge: 2024-11-22 | Disposition: A | Discharge status: Home / Self Care | Source: Ambulatory Visit | Attending: Adult Health | Admitting: Adult Health

## 2024-11-22 ENCOUNTER — Ambulatory Visit (HOSPITAL_COMMUNITY): Payer: Self-pay | Admitting: Adult Health

## 2024-11-22 VITALS — BP 122/76 | HR 65 | Wt 175.4 lb

## 2024-11-22 DIAGNOSIS — I480 Paroxysmal atrial fibrillation: Secondary | ICD-10-CM | POA: Diagnosis not present

## 2024-11-22 DIAGNOSIS — Z7901 Long term (current) use of anticoagulants: Secondary | ICD-10-CM | POA: Diagnosis not present

## 2024-11-22 DIAGNOSIS — I2510 Atherosclerotic heart disease of native coronary artery without angina pectoris: Secondary | ICD-10-CM | POA: Diagnosis not present

## 2024-11-22 DIAGNOSIS — E785 Hyperlipidemia, unspecified: Secondary | ICD-10-CM | POA: Diagnosis not present

## 2024-11-22 DIAGNOSIS — Z79899 Other long term (current) drug therapy: Secondary | ICD-10-CM | POA: Diagnosis not present

## 2024-11-22 DIAGNOSIS — Z87891 Personal history of nicotine dependence: Secondary | ICD-10-CM | POA: Insufficient documentation

## 2024-11-22 DIAGNOSIS — I428 Other cardiomyopathies: Secondary | ICD-10-CM | POA: Insufficient documentation

## 2024-11-22 DIAGNOSIS — I5022 Chronic systolic (congestive) heart failure: Secondary | ICD-10-CM | POA: Insufficient documentation

## 2024-11-22 LAB — BASIC METABOLIC PANEL WITH GFR
Anion gap: 7 (ref 5–15)
BUN: 16 mg/dL (ref 8–23)
CO2: 27 mmol/L (ref 22–32)
Calcium: 8.9 mg/dL (ref 8.9–10.3)
Chloride: 107 mmol/L (ref 98–111)
Creatinine, Ser: 1.26 mg/dL — ABNORMAL HIGH (ref 0.61–1.24)
GFR, Estimated: >60 mL/min (ref >60)
Glucose, Bld: 105 mg/dL — ABNORMAL HIGH (ref 70–99)
Potassium: 4.6 mmol/L (ref 3.5–5.1)
Sodium: 141 mmol/L (ref 135–145)

## 2024-11-22 NOTE — Patient Instructions (Signed)
 Medication Changes:  PLEASE CALL US  AND LET US  KNOW WHERE TO SEND REFILLS [phone] OPT 2  Lab Work:  Labs done today, your results will be available in MyChart, we will contact you for abnormal readings.  Follow-Up in: 6 MONTHS WITH AN ECHO PLEASE CALL OUR OFFICE AROUND MARCH 2026 TO GET SCHEDULED FOR YOUR APPOINTMENT. PHONE NUMBER IS [phone] OPTION 2   At the Advanced Heart Failure Clinic, you and your health needs are our priority. We have a designated team specialized in the treatment of Heart Failure. This Care Team includes your primary Heart Failure Specialized Cardiologist (physician), Advanced Practice Providers (APPs- Physician Assistants and Nurse Practitioners), and Pharmacist who all work together to provide you with the care you need, when you need it.   You may see any of the following providers on your designated Care Team at your next follow up:  Dr. Toribio Fuel Dr. Ezra Shuck Dr. Odis Brownie Greig Mosses, NP Caffie Shed, [place] [facility], [place] Beckey Coe, NP Jordan Lee, NP Tinnie Redman, PharmD   Please be sure to bring in all your medications bottles to every appointment.   Need to Contact Us :  If you have any questions or concerns before your next appointment please send us  a message through [place] or call our office at [phone].    TO LEAVE A MESSAGE FOR THE NURSE SELECT OPTION 2, PLEASE LEAVE A MESSAGE INCLUDING: YOUR NAME DATE OF BIRTH CALL BACK NUMBER REASON FOR CALL**this is important as we prioritize the call backs  YOU WILL RECEIVE A CALL BACK THE SAME DAY AS LONG AS YOU CALL BEFORE 4:00 PM

## 2024-12-15 ENCOUNTER — Other Ambulatory Visit (HOSPITAL_COMMUNITY): Payer: Self-pay

## 2024-12-15 MED ORDER — SACUBITRIL-VALSARTAN 49-51 MG PO TABS: 1.0000 | ORAL_TABLET | Freq: Two times a day (BID) | ORAL | 3 refills | Status: DC

## 2024-12-15 MED ORDER — APIXABAN 5 MG PO TABS: 5.0000 mg | ORAL_TABLET | Freq: Two times a day (BID) | ORAL | 3 refills | Status: DC

## 2024-12-26 ENCOUNTER — Other Ambulatory Visit (HOSPITAL_COMMUNITY): Payer: Self-pay | Admitting: *Deleted

## 2024-12-26 MED ORDER — APIXABAN 5 MG PO TABS: 5.0000 mg | ORAL_TABLET | Freq: Two times a day (BID) | ORAL | 3 refills | Status: AC

## 2024-12-26 MED ORDER — APIXABAN 5 MG PO TABS: 5.0000 mg | ORAL_TABLET | Freq: Two times a day (BID) | ORAL | 3 refills | Status: DC

## 2024-12-26 NOTE — Telephone Encounter (Signed)
 Received VM from [facility] requesting refill on Eliquis .  RX printed and faxed to them at [phone]

## 2024-12-26 NOTE — Addendum Note (Signed)
 Addended by: BUELL POWELL HERO on: 12/26/2024 12:03 PM   Modules accepted: Orders

## 2024-12-30 ENCOUNTER — Other Ambulatory Visit (HOSPITAL_COMMUNITY): Payer: Self-pay | Admitting: Cardiology

## 2024-12-30 MED ORDER — SACUBITRIL-VALSARTAN 49-51 MG PO TABS: 1.0000 | ORAL_TABLET | Freq: Two times a day (BID) | ORAL | 3 refills | Status: DC

## 2024-12-30 NOTE — Telephone Encounter (Signed)
 Pt request rx for entreso to [facility] and faxed to them at [phone]

## 2025-01-13 ENCOUNTER — Other Ambulatory Visit (HOSPITAL_COMMUNITY): Payer: Self-pay | Admitting: Cardiology

## 2025-01-31 ENCOUNTER — Encounter: Payer: Self-pay | Admitting: Cardiology

## 2025-01-31 ENCOUNTER — Ambulatory Visit: Admitting: Cardiology

## 2025-01-31 VITALS — BP 132/68 | HR 68 | Ht 70.0 in | Wt 172.0 lb

## 2025-01-31 DIAGNOSIS — I5020 Unspecified systolic (congestive) heart failure: Secondary | ICD-10-CM

## 2025-01-31 DIAGNOSIS — I480 Paroxysmal atrial fibrillation: Secondary | ICD-10-CM

## 2025-01-31 DIAGNOSIS — D6869 Other thrombophilia: Secondary | ICD-10-CM

## 2025-01-31 MED ORDER — SACUBITRIL-VALSARTAN 49-51 MG PO TABS: 1.0000 | ORAL_TABLET | Freq: Two times a day (BID) | ORAL | 1 refills | Status: AC

## 2025-02-06 ENCOUNTER — Ambulatory Visit

## 2025-04-17 ENCOUNTER — Ambulatory Visit: Admitting: Cardiology
